# Patient Record
Sex: Female | Born: 1965 | Hispanic: No | Marital: Single | Smoking: Never smoker
Health system: Southern US, Community
[De-identification: ages and names within clinical notes are randomized; demographics above are authoritative.]

## PROBLEM LIST (undated history)

## (undated) DIAGNOSIS — E46 Unspecified protein-calorie malnutrition: Secondary | ICD-10-CM

## (undated) DIAGNOSIS — M1990 Unspecified osteoarthritis, unspecified site: Secondary | ICD-10-CM

## (undated) DIAGNOSIS — M329 Systemic lupus erythematosus, unspecified: Secondary | ICD-10-CM

## (undated) DIAGNOSIS — F32A Depression, unspecified: Secondary | ICD-10-CM

## (undated) DIAGNOSIS — D649 Anemia, unspecified: Secondary | ICD-10-CM

## (undated) HISTORY — PX: OVARIAN CYST REMOVAL: SHX89

---

## 2001-04-23 ENCOUNTER — Other Ambulatory Visit: Admission: RE | Admit: 2001-04-23 | Discharge: 2001-04-23 | Payer: Self-pay | Admitting: Obstetrics and Gynecology

## 2001-12-29 ENCOUNTER — Inpatient Hospital Stay (HOSPITAL_COMMUNITY): Admission: AD | Admit: 2001-12-29 | Discharge: 2002-01-01 | Payer: Self-pay | Admitting: Obstetrics and Gynecology

## 2002-01-02 ENCOUNTER — Encounter (HOSPITAL_COMMUNITY): Admission: RE | Admit: 2002-01-02 | Discharge: 2002-02-01 | Payer: Self-pay | Admitting: Obstetrics and Gynecology

## 2002-02-07 ENCOUNTER — Other Ambulatory Visit: Admission: RE | Admit: 2002-02-07 | Discharge: 2002-02-07 | Payer: Self-pay | Admitting: Obstetrics and Gynecology

## 2002-03-04 ENCOUNTER — Encounter: Admission: RE | Admit: 2002-03-04 | Discharge: 2002-04-03 | Payer: Self-pay | Admitting: Obstetrics and Gynecology

## 2002-05-04 ENCOUNTER — Encounter: Admission: RE | Admit: 2002-05-04 | Discharge: 2002-06-03 | Payer: Self-pay | Admitting: Obstetrics and Gynecology

## 2002-06-04 ENCOUNTER — Encounter: Admission: RE | Admit: 2002-06-04 | Discharge: 2002-07-04 | Payer: Self-pay | Admitting: Obstetrics and Gynecology

## 2002-08-03 ENCOUNTER — Encounter: Admission: RE | Admit: 2002-08-03 | Discharge: 2002-09-02 | Payer: Self-pay | Admitting: Obstetrics and Gynecology

## 2002-10-03 ENCOUNTER — Encounter: Admission: RE | Admit: 2002-10-03 | Discharge: 2002-11-02 | Payer: Self-pay | Admitting: Obstetrics and Gynecology

## 2002-12-03 ENCOUNTER — Encounter: Admission: RE | Admit: 2002-12-03 | Discharge: 2003-01-02 | Payer: Self-pay | Admitting: Obstetrics and Gynecology

## 2003-01-03 ENCOUNTER — Encounter: Admission: RE | Admit: 2003-01-03 | Discharge: 2003-02-02 | Payer: Self-pay | Admitting: Obstetrics and Gynecology

## 2003-06-26 ENCOUNTER — Encounter: Admission: RE | Admit: 2003-06-26 | Discharge: 2003-06-26 | Payer: Self-pay | Admitting: Internal Medicine

## 2006-02-12 ENCOUNTER — Emergency Department (HOSPITAL_COMMUNITY): Admission: EM | Admit: 2006-02-12 | Discharge: 2006-02-12 | Payer: Self-pay | Admitting: Emergency Medicine

## 2008-06-08 ENCOUNTER — Other Ambulatory Visit: Admission: RE | Admit: 2008-06-08 | Discharge: 2008-06-08 | Payer: Self-pay | Admitting: Family Medicine

## 2008-06-15 ENCOUNTER — Encounter: Admission: RE | Admit: 2008-06-15 | Discharge: 2008-06-15 | Payer: Self-pay | Admitting: Family Medicine

## 2010-09-06 NOTE — H&P (Signed)
Sheila Lang, Sheila Lang                         ACCOUNT NO.:  [id number]   MEDICAL RECORD NO.:  [id number]                   PATIENT TYPE:  INP   LOCATION:  9162                                 FACILITY:  [facility]   PHYSICIAN:  Genia Del, M.D.             DATE OF BIRTH:  Feb 27, 1966   DATE OF ADMISSION:  12/29/2001  DATE OF DISCHARGE:                                HISTORY & PHYSICAL   HISTORY AND PHYSICAL:  The patient is a 45 year old G1.  Expected date of  delivery January 23, 2002 at 36 weeks and 4 days gestation.   REASON FOR ADMISSION:  Regular uterine contractions every three to four  minutes increasing in intensity since 5:01 p.m. on September 10.   HISTORY OF PRESENT ILLNESS:  The patient had increasing uterine contractions  becoming regular.  No fluid leak except for one gush the night before, but  then nothing else after.  No vaginal bleeding.  Good fetal movement.  No PIH  symptoms.   PAST MEDICAL HISTORY:  Negative.   PAST SURGICAL HISTORY:  Negative.   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  Colposcopy  was done.  No treatment required on the cervix.  Paps normal since then.  History of mild pelvic endometriosis with ovarian cysts diagnosed by  laparoscopy per charts.   ALLERGIES:  No known drug allergies.   MEDICATIONS:  Prenate vitamins.   SOCIAL HISTORY:  Engaged.  Nonsmoker.   HISTORY OF PRESENT PREGNANCY:  First trimester was normal.  Laboratories  showed hemoglobin at 13+, platelets 240,000.  Blood type Rh A+.  Rh antibody  negative.  Sickle cell negative.  Thalassemia negative.  RPR nonreactive.  HBSAG negative.  HIV nonreactive.  Rubella immune.  Pap test was normal.  Gonorrhea and Chlamydia negative.  In the second trimester she declined the  triple test.  An ultrasound review of anatomy was within normal at 21+  weeks.  Placenta was anterior grade 0.  Cervix was 3.5 cm.  The patient had  a dating ultrasound at 12+ weeks.   One hour GTT in the third trimester was  increased.  The three hour GTT was normal.  A repeat ultrasound at 30 weeks  showed an estimated fetal weight of the 20th percentile and amniotic fluid  was normal.  The patient had normal blood pressures throughout pregnancy,  normal weight gain.  Group B Strep was positive.   REVIEW OF SYMPTOMS:  CONSTITUTIONAL:  Negative.  HEENT, cardiovascular,  respiratory, GI, urologic, neurologic, dermatologic, endocrinologic  negative.   PHYSICAL EXAMINATION:  GENERAL:  In pain with uterine contractions.  No  apparent distress otherwise.  VITAL SIGNS:  Stable.  Normal blood pressure.  Afebrile.  LUNGS:  Clear bilaterally.  HEART:  Regular cardiac rhythm.  ABDOMEN:  Gravid uterus.  Cephalic presentation.  PELVIC:  Vaginal examination on admission was 1+ cm, 80-90%, vertex, -2.  Membranes appeared  intact.  The second examination afterwards at 2+, 90%,  vertex, -2.  Membranes were felt better.  EXTREMITIES:  Lower limbs normal.  No edema.   Monitoring:  Fetal heart rate baseline 130 with good viability,  accelerations positive, no decelerations.  Uterine contractions every two to  four minutes lasting 50-60 seconds, mild to moderate.   IMPRESSION:  G1 36 weeks and 4 days gestation entering spontaneous labor.  Fetal well-being reassuring.  Group B Strep positive.   PLAN:  Admit to labor and delivery.  Penicillin G protocol.  Expectant  management towards probable vaginal delivery.                                               Genia Del, M.D.    ML/MEDQ  D:  12/30/2001  T:  12/30/2001  Job:  [id number]

## 2013-06-25 ENCOUNTER — Emergency Department (INDEPENDENT_AMBULATORY_CARE_PROVIDER_SITE_OTHER): Payer: Worker's Compensation

## 2013-06-25 ENCOUNTER — Encounter (HOSPITAL_COMMUNITY): Payer: Self-pay | Admitting: Emergency Medicine

## 2013-06-25 ENCOUNTER — Emergency Department (INDEPENDENT_AMBULATORY_CARE_PROVIDER_SITE_OTHER)
Admission: EM | Admit: 2013-06-25 | Discharge: 2013-06-25 | Disposition: A | Discharge status: Home / Self Care | Payer: Worker's Compensation | Source: Home / Self Care | Attending: Family Medicine | Admitting: Family Medicine

## 2013-06-25 DIAGNOSIS — M25539 Pain in unspecified wrist: Secondary | ICD-10-CM

## 2013-06-25 DIAGNOSIS — W19XXXA Unspecified fall, initial encounter: Secondary | ICD-10-CM

## 2013-06-25 DIAGNOSIS — M25569 Pain in unspecified knee: Secondary | ICD-10-CM | POA: Diagnosis not present

## 2013-06-25 MED ORDER — IBUPROFEN 800 MG PO TABS: 800.0000 mg | ORAL_TABLET | Freq: Three times a day (TID) | ORAL | Status: DC | PRN

## 2013-06-25 NOTE — ED Notes (Signed)
Fall    inj   Both  Hands   And  Knees          Pt  Was  Moving a  Pt  At  Work     At  [employer]  When  She  Lost  Her  Balance  And  Felled          She  Ambulated  To  Room  With a  Steady  Fluid gait

## 2013-06-25 NOTE — Discharge Instructions (Signed)
Wrist Pain Wrist injuries are frequent in adults and children. A sprain is an injury to the ligaments that hold your bones together. A strain is an injury to muscle or muscle cord-like structures (tendons) from stretching or pulling. Generally, when wrists are moderately tender to touch following a fall or injury, a break in the bone (fracture) may be present. Most wrist sprains or strains are better in 3 to 5 days, but complete healing may take several weeks. HOME CARE INSTRUCTIONS   Put ice on the injured area.  Put ice in a plastic bag.  Place a towel between your skin and the bag.  Leave the ice on for 15-20 minutes, 03-04 times a day, for the first 2 days.  Keep your arm raised above the level of your heart whenever possible to reduce swelling and pain.  Rest the injured area for at least 48 hours or as directed by your caregiver.  If a splint or elastic bandage has been applied, use it for as long as directed by your caregiver or until seen by a caregiver for a follow-up exam.  Only take over-the-counter or prescription medicines for pain, discomfort, or fever as directed by your caregiver.  Keep all follow-up appointments. You may need to follow up with a specialist or have follow-up X-rays. Improvement in pain level is not a guarantee that you did not fracture a bone in your wrist. The only way to determine whether or not you have a broken bone is by X-ray. SEEK IMMEDIATE MEDICAL CARE IF:   Your fingers are swollen, very red, white, or cold and blue.  Your fingers are numb or tingling.  You have increasing pain.  You have difficulty moving your fingers. MAKE SURE YOU:   Understand these instructions.  Will watch your condition.  Will get help right away if you are not doing well or get worse. Document Released: 01/15/2005 Document Revised: 06/30/2011 Document Reviewed: 05/29/2010 [facility] Patient Information 2014 [place], [place].  Knee Pain Knee pain can be a result  of an injury or other medical conditions. Treatment will depend on the cause of your pain. HOME CARE  Only take medicine as told by your doctor.  Keep a healthy weight. Being overweight can make the knee hurt more.  Stretch before exercising or playing sports.  If there is constant knee pain, change the way you exercise. Ask your doctor for advice.  Make sure shoes fit well. Choose the right shoe for the sport or activity.  Protect your knees. Wear kneepads if needed.  Rest when you are tired. GET HELP RIGHT AWAY IF:   Your knee pain does not stop.  Your knee pain does not get better.  Your knee joint feels hot to the touch.  You have a fever. MAKE SURE YOU:   Understand these instructions.  Will watch this condition.  Will get help right away if you are not doing well or get worse. Document Released: 07/04/2008 Document Revised: 06/30/2011 Document Reviewed: 07/04/2008 [facility] Patient Information 2014 [place], [place].  Musculoskeletal Pain Musculoskeletal pain is muscle and boney aches and pains. These pains can occur in any part of the body. Your caregiver may treat you without knowing the cause of the pain. They may treat you if blood or urine tests, X-rays, and other tests were normal.  CAUSES There is often not a definite cause or reason for these pains. These pains may be caused by a type of germ (virus). The discomfort may also come from overuse.  Overuse includes working out too hard when your body is not fit. Boney aches also come from weather changes. Bone is sensitive to atmospheric pressure changes. HOME CARE INSTRUCTIONS   Ask when your test results will be ready. Make sure you get your test results.  Only take over-the-counter or prescription medicines for pain, discomfort, or fever as directed by your caregiver. If you were given medications for your condition, do not drive, operate machinery or power tools, or sign legal documents for 24 hours. Do not drink  alcohol. Do not take sleeping pills or other medications that may interfere with treatment.  Continue all activities unless the activities cause more pain. When the pain lessens, slowly resume normal activities. Gradually increase the intensity and duration of the activities or exercise.  During periods of severe pain, bed rest may be helpful. Lay or sit in any position that is comfortable.  Putting ice on the injured area.  Put ice in a bag.  Place a towel between your skin and the bag.  Leave the ice on for 15 to 20 minutes, 3 to 4 times a day.  Follow up with your caregiver for continued problems and no reason can be found for the pain. If the pain becomes worse or does not go away, it may be necessary to repeat tests or do additional testing. Your caregiver may need to look further for a possible cause. SEEK IMMEDIATE MEDICAL CARE IF:  You have pain that is getting worse and is not relieved by medications.  You develop chest pain that is associated with shortness or breath, sweating, feeling sick to your stomach (nauseous), or throw up (vomit).  Your pain becomes localized to the abdomen.  You develop any new symptoms that seem different or that concern you. MAKE SURE YOU:   Understand these instructions.  Will watch your condition.  Will get help right away if you are not doing well or get worse. Document Released: 04/07/2005 Document Revised: 06/30/2011 Document Reviewed: 12/10/2012 [facility] Patient Information 2014 [place].

## 2013-06-25 NOTE — ED Provider Notes (Signed)
CSN: [id number]     Arrival date & time 06/25/13  [id number] History   First MD Initiated Contact with Patient 06/25/13 [phone]     Chief Complaint  Patient presents with  . Fall   (Consider location/radiation/quality/duration/timing/severity/associated sxs/prior Treatment) HPI Comments: [id number] presents for eval of bilateral wrist and knee pain after falling at work earlier today. 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 She has pain now in both knees and hands. She rates the pain as severe. She is having some mild tingling sensation in her right leg also. Denies any swelling. She has not taken anything for pain. No other injuries. She is ambulatory. She is requesting x-rays.  Patient is a 48 y.o. female presenting with fall.  Fall Pertinent negatives include no chest pain, no abdominal pain and no shortness of breath.    History reviewed. No pertinent past medical history. History reviewed. No pertinent past surgical history. No family history on file. History  Substance Use Topics  . Smoking status: Never Smoker   . Smokeless tobacco: Not on file  . Alcohol Use: No   OB History   Grav Para Term Preterm Abortions TAB SAB Ect Mult Living                 Review of Systems  Constitutional: Negative for fever and chills.  Eyes: Negative for visual disturbance.  Respiratory: Negative for cough and shortness of breath.   Cardiovascular: Negative for chest pain, palpitations and leg swelling.  Gastrointestinal: Negative for nausea, vomiting and abdominal pain.  Endocrine: Negative for polydipsia and polyuria.  Genitourinary: Negative for dysuria, urgency and frequency.  Musculoskeletal: Positive for arthralgias. Negative for myalgias.  Skin: Negative for rash.  Neurological: Negative for dizziness, weakness and light-headedness.    Allergies  Review of patient's allergies indicates no known allergies.  Home Medications   Current  Outpatient Rx  Name  Route  Sig  Dispense  Refill  . ibuprofen (ADVIL,MOTRIN) 800 MG tablet   Oral   Take 1 tablet (800 mg total) by mouth every 8 (eight) hours as needed.   30 tablet   0    BP 103/50  Pulse 81  Temp(Src) 98.5 F (36.9 C) (Oral)  Resp 16  SpO2 100%  LMP 05/28/2013 Physical Exam  Nursing note and vitals reviewed. Constitutional: She is oriented to person, place, and time. Vital signs are normal. She appears well-developed and well-nourished. No distress.  HENT:  Head: Normocephalic and atraumatic.  Pulmonary/Chest: Effort normal. No respiratory distress.  Musculoskeletal:  Objectively, exam of both knees, wrists, and hands are normal with no deformities, swelling, or bruising. There is no ligamentous laxity. Subjectively, she has tenderness on the dorsal and volar surface of both wrists including the anatomic snuffbox, although the pain is no worse over the anatomic snuffbox than anywhere else. The right knee, she has tenderness at the tibial plateau through the patellar tendon, and on the patella. On the left knee, she has tenderness on the patella tendon and the patella. She has full grip strength, full strength of the lower extremities bilaterally. Sensation is intact. Capillary refill is less than 2 seconds.  Neurological: She is alert and oriented to person, place, and time. She has normal strength. Coordination normal.  Skin: Skin is warm and dry. No rash noted. She is not diaphoretic.  Psychiatric: She has a normal mood and affect. Judgment normal.    ED Course  Procedures (including  critical care time) Labs Review Labs Reviewed - No data to display Imaging Review Dg Wrist Complete Left  06/25/2013   CLINICAL DATA:  Pain in bilateral wrists.  Fall.  EXAM: LEFT WRIST - COMPLETE 3+ VIEW  COMPARISON:  None.  FINDINGS: There is no evidence of fracture or dislocation. There is no evidence of arthropathy or other focal bone abnormality. Soft tissues are  unremarkable.  IMPRESSION: Negative.   Electronically Signed   By: Signa Kell M.D.   On: 06/25/2013 10:35   Dg Wrist Complete Right  06/25/2013   CLINICAL DATA:  Fall.  Pain  EXAM: RIGHT WRIST - COMPLETE 3+ VIEW  COMPARISON:  None.  FINDINGS: There is no evidence of fracture or dislocation. There is no evidence of arthropathy or other focal bone abnormality. Soft tissue swelling overlying the ulnar styloid noted.  IMPRESSION: 1. No acute bone findings. 2. Soft tissue swelling   Electronically Signed   By: Signa Kell M.D.   On: 06/25/2013 10:41   Dg Knee Complete 4 Views Left  06/25/2013   CLINICAL DATA:  Fall.  Bilateral knee pain.  EXAM: LEFT KNEE - COMPLETE 4+ VIEW  COMPARISON:  None.  FINDINGS: There is no evidence of fracture, dislocation, or joint effusion. There is no evidence of arthropathy or other focal bone abnormality. Soft tissues are unremarkable.  IMPRESSION: Negative.   Electronically Signed   By: Charlett Nose M.D.   On: 06/25/2013 10:48   Dg Knee Complete 4 Views Right  06/25/2013   CLINICAL DATA:  Fall.  EXAM: RIGHT KNEE - COMPLETE 4+ VIEW  COMPARISON:  None.  FINDINGS: There is no evidence of fracture, dislocation, or joint effusion. There is no evidence of arthropathy or other focal bone abnormality. Soft tissues are unremarkable.  IMPRESSION: Negative.   Electronically Signed   By: Charlett Nose M.D.   On: 06/25/2013 10:48     MDM   1. Fall   2. Knee pain   3. Wrist pain    All x-rays are negative. Will prescribe ibuprofen when necessary, she may return to work. Followup when necessary if not improving for remote possibility of occult fracture, will repeat x-ray   Meds ordered this encounter  Medications  . ibuprofen (ADVIL,MOTRIN) 800 MG tablet    Sig: Take 1 tablet (800 mg total) by mouth every 8 (eight) hours as needed.    Dispense:  30 tablet    Refill:  0    Order Specific Question:  Supervising Provider    Answer:  Bradd Canary D [[id number]]       Graylon Good, PA-C 06/25/13 1059

## 2013-06-25 NOTE — ED Provider Notes (Signed)
Medical screening examination/treatment/procedure(s) were performed by resident physician or non-physician practitioner and as supervising physician I was immediately available for consultation/collaboration.   Adalaide Jaskolski DOUGLAS MD.   Zeffie Bickert D Apollos Tenbrink, MD 06/25/13 1453 

## 2015-01-29 IMAGING — CR DG WRIST COMPLETE 3+V*R*
2 series · 2 of 2 positions shown · non-contrast
Comparison: None.

CLINICAL DATA: Fall.  Pain

EXAM:
RIGHT WRIST - COMPLETE 3+ VIEW

[view not recorded (1 of 2)]
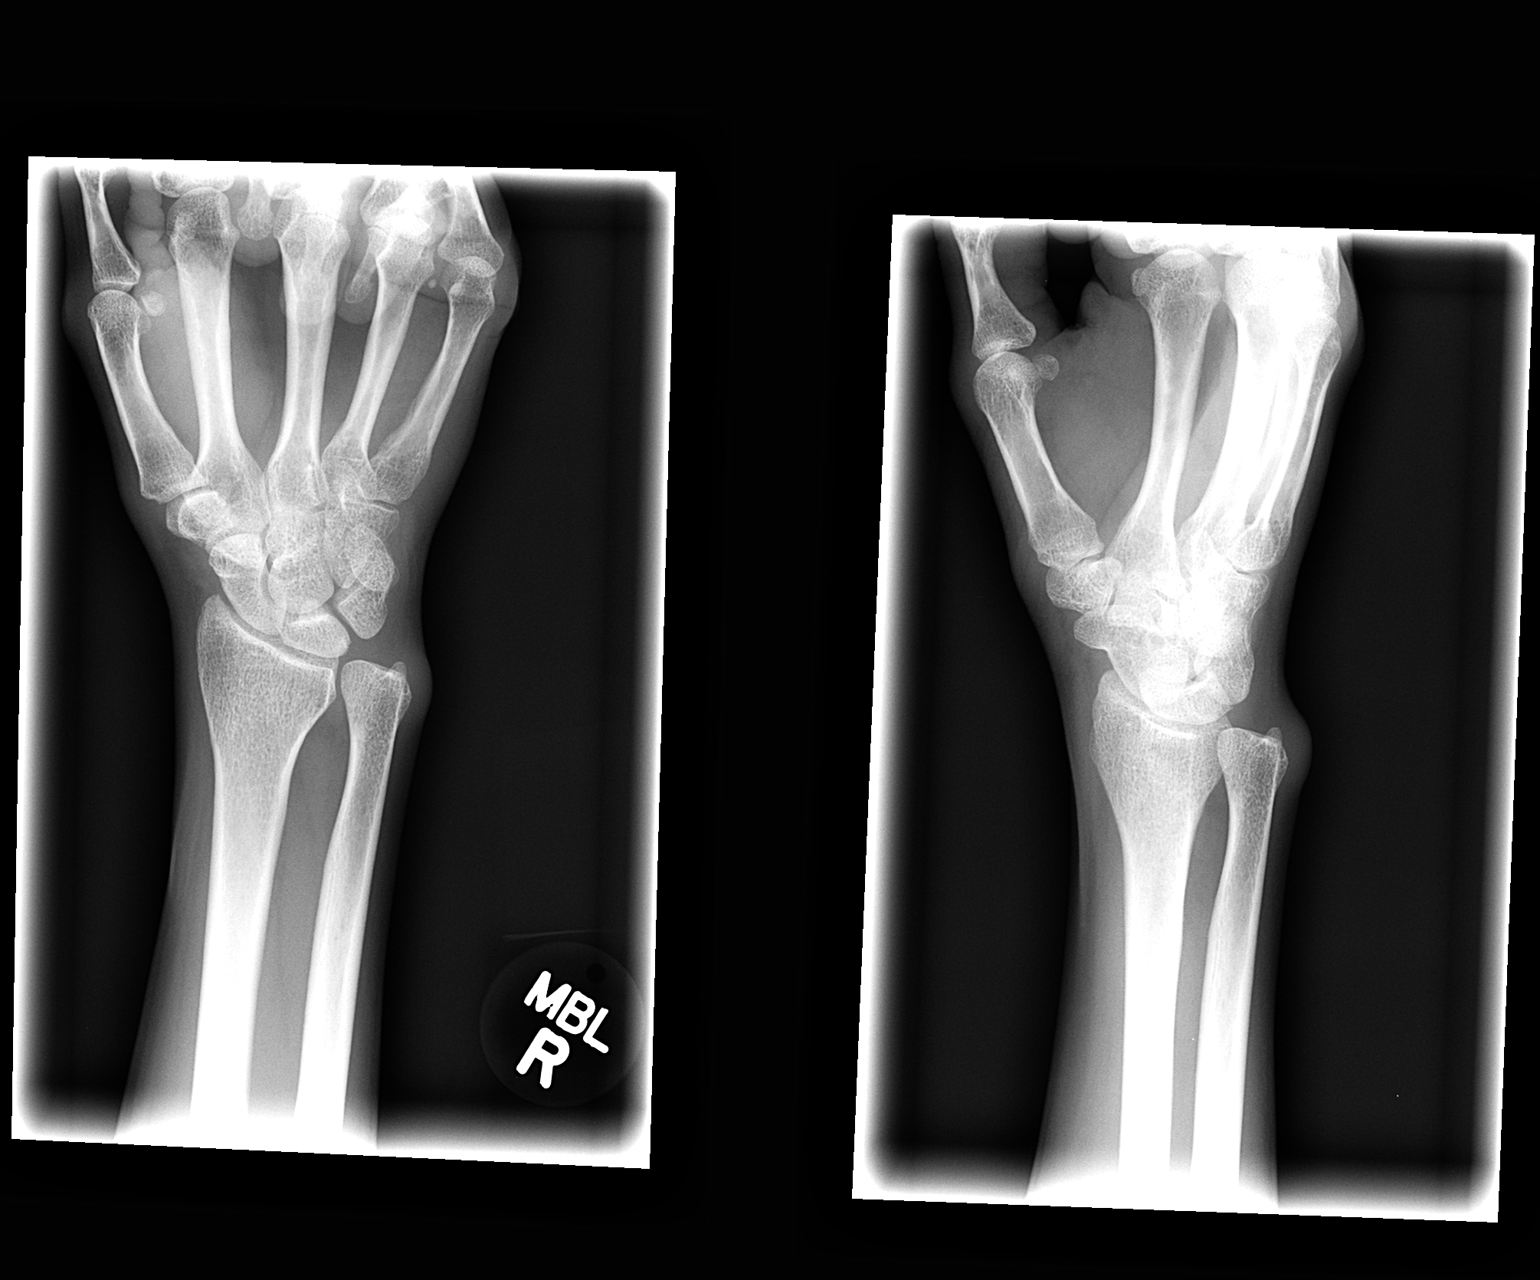

[view not recorded (2 of 2)]
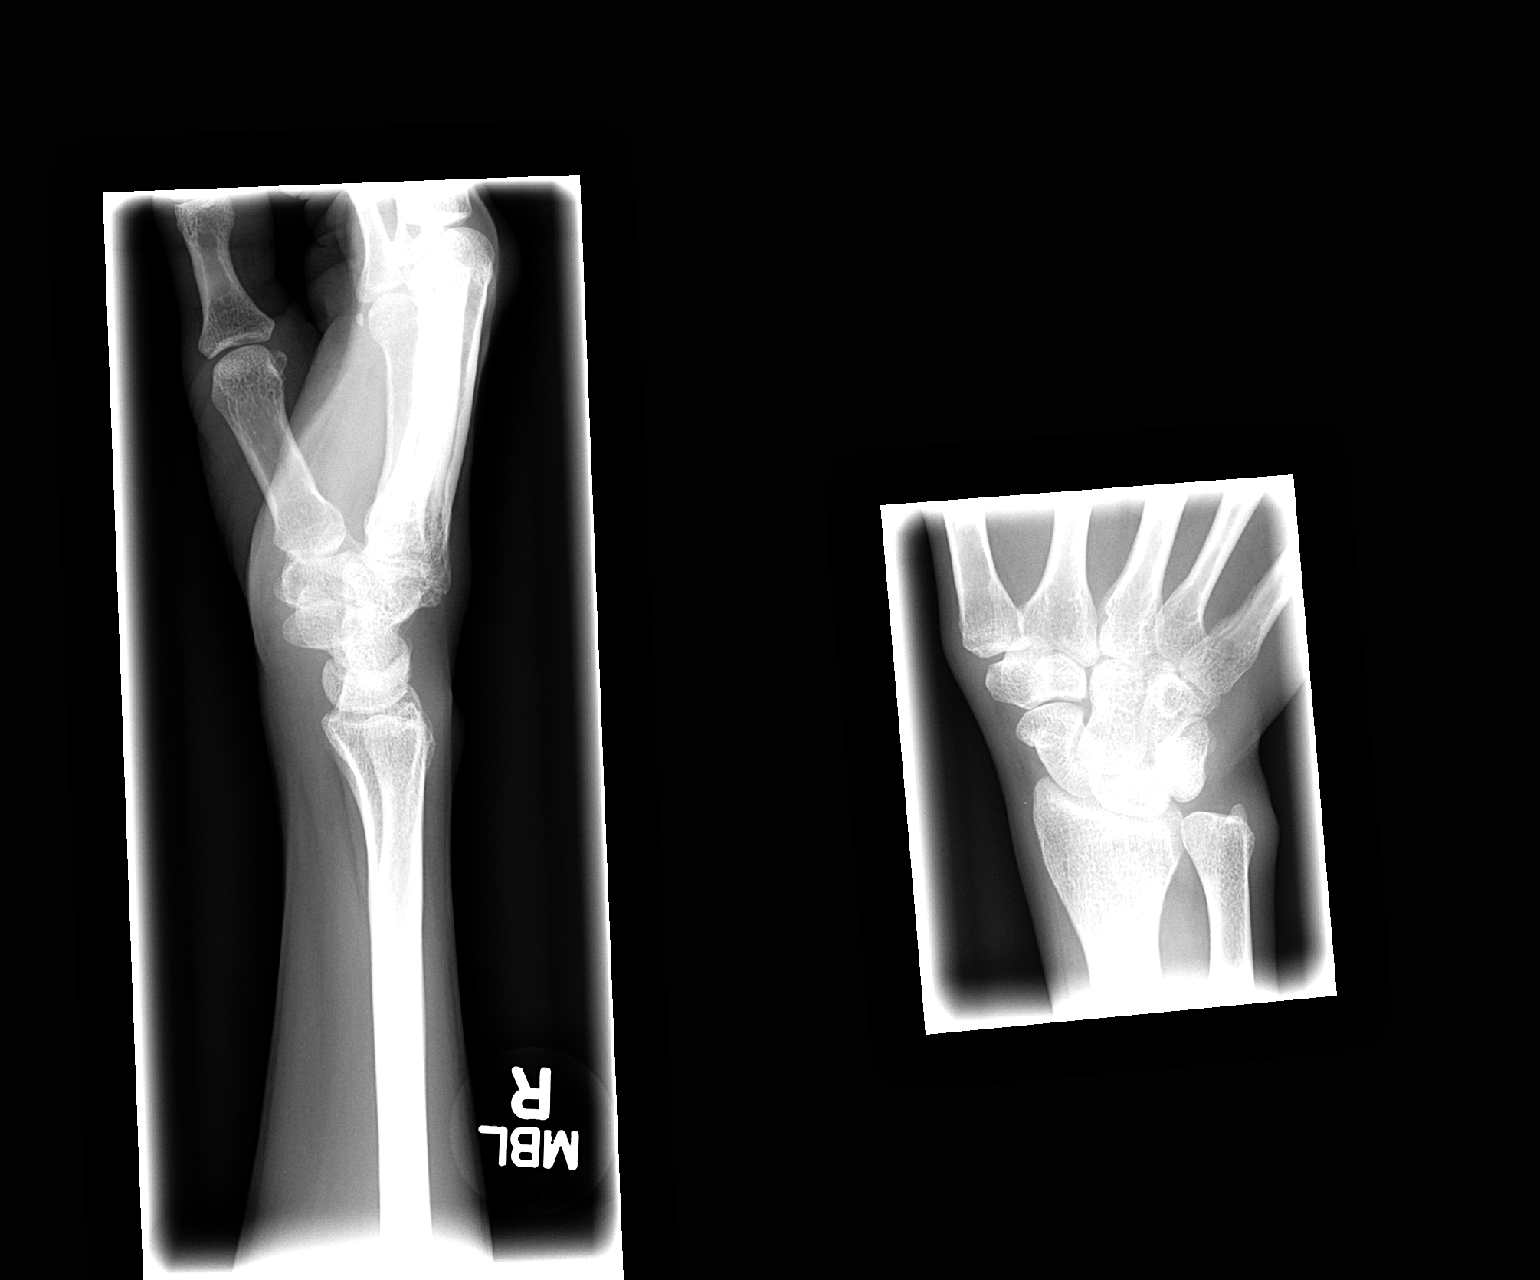

[2 of 2 positions shown; findings below may reference images not displayed]

FINDINGS: There is no evidence of fracture or dislocation. There is no
evidence of arthropathy or other focal bone abnormality. Soft tissue
swelling overlying the ulnar styloid noted.
IMPRESSION: 1. No acute bone findings.
2. Soft tissue swelling

## 2015-01-29 IMAGING — CR DG KNEE COMPLETE 4+V*L*
4 series · 4 of 4 positions shown · non-contrast
Comparison: None.

CLINICAL DATA: Fall.  Bilateral knee pain.

EXAM:
LEFT KNEE - COMPLETE 4+ VIEW

[view not recorded (1 of 4)]
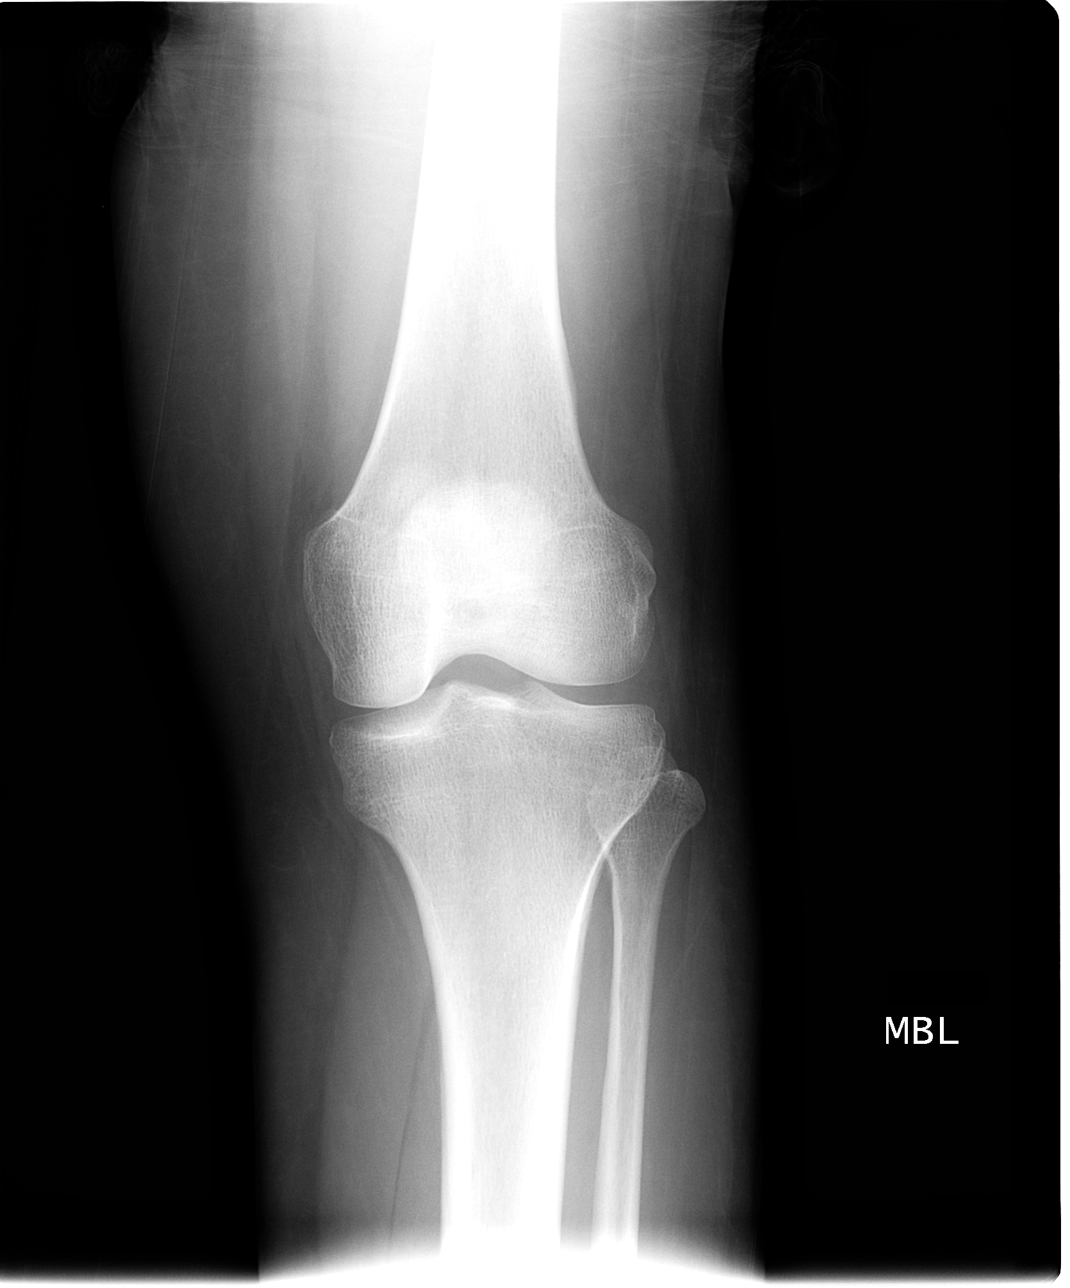

[view not recorded (2 of 4)]
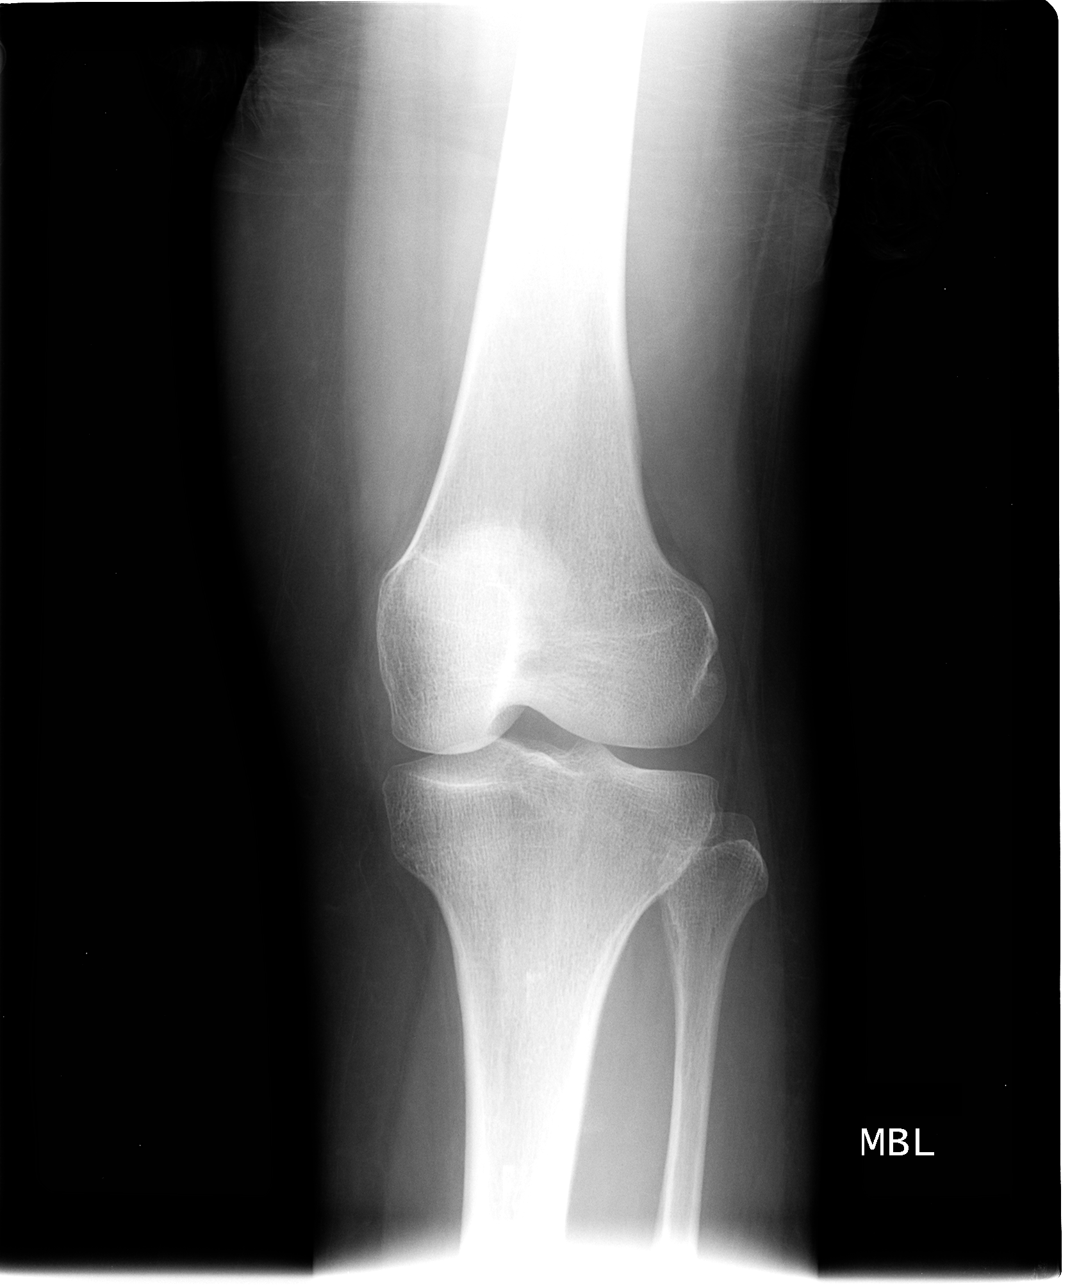

[view not recorded (3 of 4)]
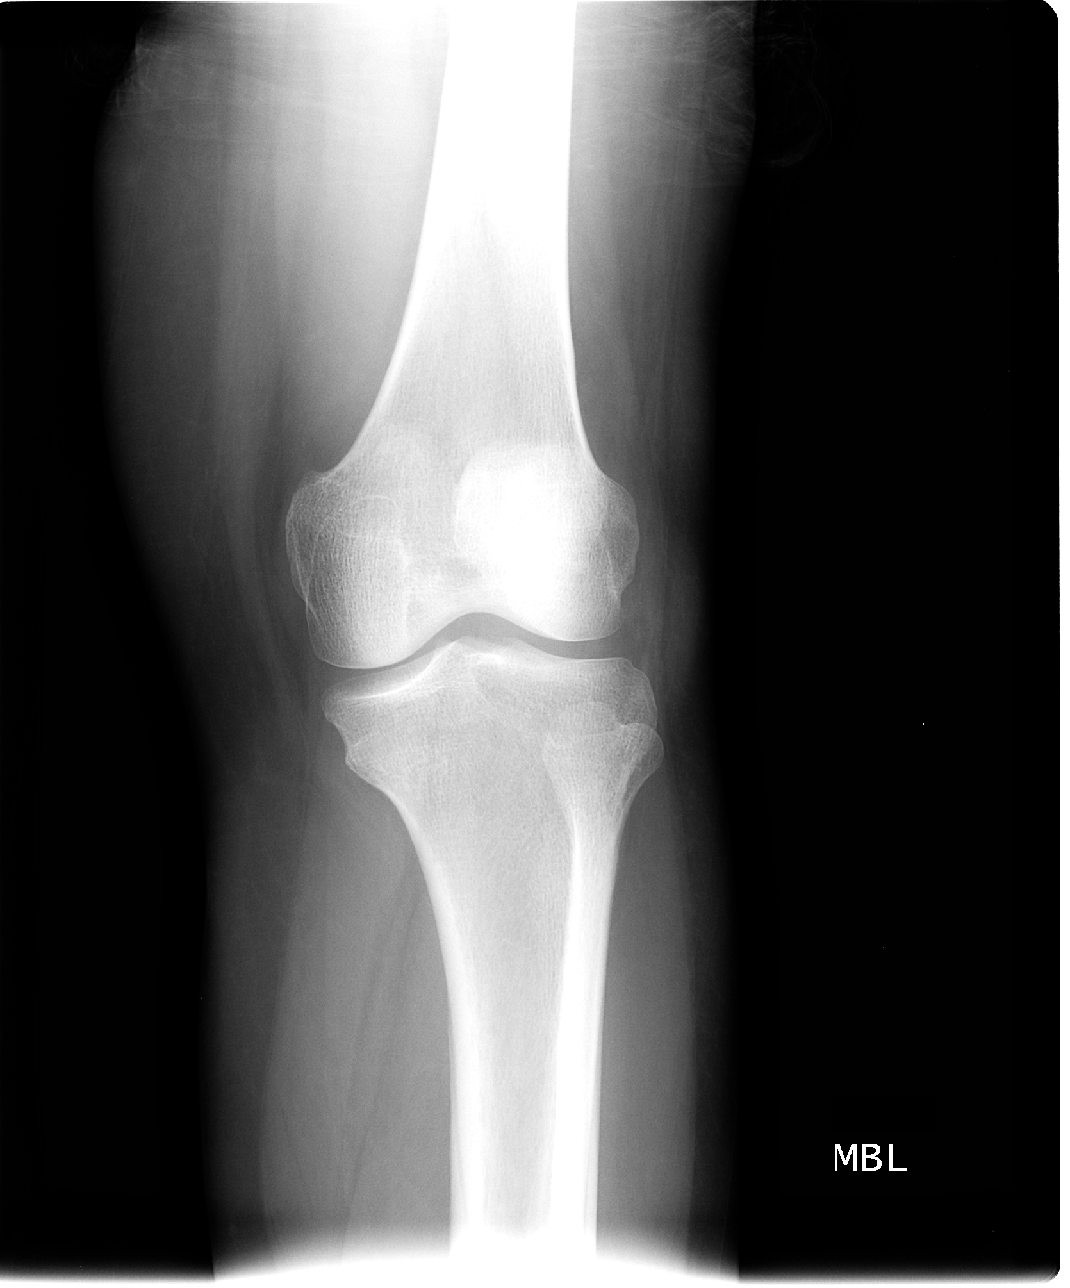

[view not recorded (4 of 4)]
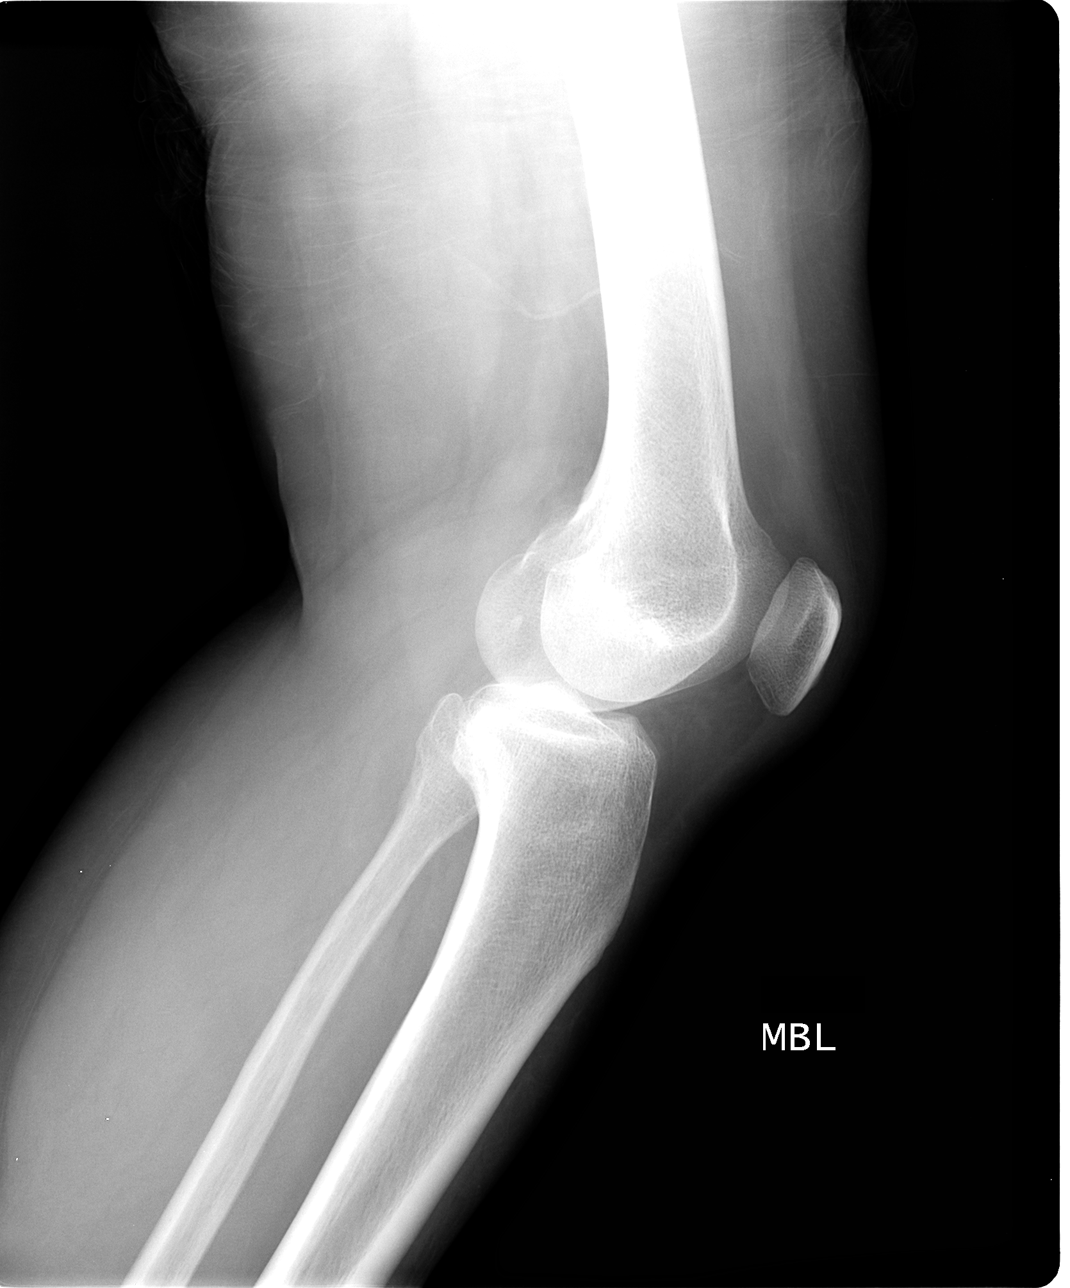

[4 of 4 positions shown; findings below may reference images not displayed]

FINDINGS: There is no evidence of fracture, dislocation, or joint effusion.
There is no evidence of arthropathy or other focal bone abnormality.
Soft tissues are unremarkable.
IMPRESSION: Negative.

## 2018-03-10 ENCOUNTER — Emergency Department
Admission: EM | Admit: 2018-03-10 | Discharge: 2018-03-10 | Disposition: A | Discharge status: Home / Self Care | Payer: Self-pay | Attending: Emergency Medicine | Admitting: Emergency Medicine

## 2018-03-10 ENCOUNTER — Emergency Department: Payer: Self-pay

## 2018-03-10 ENCOUNTER — Other Ambulatory Visit: Payer: Self-pay

## 2018-03-10 ENCOUNTER — Encounter: Payer: Self-pay | Admitting: Emergency Medicine

## 2018-03-10 DIAGNOSIS — Z111 Encounter for screening for respiratory tuberculosis: Secondary | ICD-10-CM | POA: Insufficient documentation

## 2018-03-10 NOTE — ED Triage Notes (Signed)
Pt is here for CT scan of chest and blood work for TB. Pt states she needs these before starting job. States that she has a job at [employer] and had chest xray. States that employer told her to go to other hospitals to see if they can help her d/t not having insurance to schedule CT and blood work outpatient through their employee clinic. Spoke to Dr. Sharma CovertNorman and orders received.

## 2018-03-10 NOTE — ED Provider Notes (Signed)
[facility] Emergency Department Provider Note  ____________________________________________  Time seen: Approximately 5:50 PM  I have reviewed the triage vital signs and the nursing notes.   HISTORY  Chief Complaint No chief complaint on file.   HPI Sheila Lang is a 52 y.o. female presents requesting TB screening.  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  As part of her medical clearance she had a chest x-ray done which was abnormal.  Because she is from [place] she was told that she needed a CT scan and a QuantiFERON gold blood test to rule out TB.  Patient denies night sweats, unintentional weight loss, hemoptysis, chest pain or shortness of breath.  She denies any known prior exposures to tuberculosis.  She was told at [facility] to come to her local hospital to see if she could have these tests done.  She presented today requesting that this test be done.   Past Surgical History:  Procedure Laterality Date  . OVARIAN CYST REMOVAL      Prior to Admission medications   Medication Sig Start Date End Date Taking? Authorizing Provider  ibuprofen (ADVIL,MOTRIN) 800 MG tablet Take 1 tablet (800 mg total) by mouth every 8 (eight) hours as needed. 06/25/13   Graylon Good, PA-C    Allergies Patient has no known allergies.  History reviewed. No pertinent family history.  Social History Social History   Tobacco Use  . Smoking status: Never Smoker  Substance Use Topics  . Alcohol use: No  . Drug use: Not on file    Review of Systems  Constitutional: Negative for fever. Eyes: Negative for visual changes. ENT: Negative for sore throat. Neck: No neck pain  Cardiovascular: Negative for chest pain. Respiratory: Negative for shortness of breath. Gastrointestinal: Negative for abdominal pain, vomiting or diarrhea. Genitourinary: Negative for dysuria. Musculoskeletal: Negative for back pain. Skin: Negative for  rash. Neurological: Negative for headaches, weakness or numbness. Psych: No SI or HI  ____________________________________________   PHYSICAL EXAM:  VITAL SIGNS: ED Triage Vitals  Enc Vitals Group     BP 03/10/18 1355 127/60     Pulse Rate 03/10/18 1355 97     Resp 03/10/18 1355 16     Temp 03/10/18 1355 97.9 F (36.6 C)     Temp Source 03/10/18 1355 Oral     SpO2 03/10/18 1355 100 %     Weight 03/10/18 1356 110 lb (49.9 kg)     Height 03/10/18 1356 5' (1.524 m)     Head Circumference --      Peak Flow --      Pain Score 03/10/18 1403 0     Pain Loc --      Pain Edu? --      Excl. in GC? --     Constitutional: Alert and oriented. Well appearing and in no apparent distress. HEENT:      Head: Normocephalic and atraumatic.         Eyes: Conjunctivae are normal. Sclera is non-icteric.       Mouth/Throat: Mucous membranes are moist.       Neck: Supple with no signs of meningismus. Cardiovascular: Regular rate and rhythm. No murmurs, gallops, or rubs. 2+ symmetrical distal pulses are present in all extremities. No JVD. Respiratory: Normal respiratory effort. Lungs are clear to auscultation bilaterally. No wheezes, crackles, or rhonchi.  Gastrointestinal: Soft, non tender, and non distended with positive bowel sounds. No rebound or guarding. Musculoskeletal: Nontender  with normal range of motion in all extremities. No edema, cyanosis, or erythema of extremities. Neurologic: Normal speech and language. Face is symmetric. Moving all extremities. No gross focal neurologic deficits are appreciated. Skin: Skin is warm, dry and intact. No rash noted. Psychiatric: Mood and affect are normal. Speech and behavior are normal.  ____________________________________________   LABS (all labs ordered are listed, but only abnormal results are displayed)  Labs Reviewed  QUANTIFERON-TB GOLD PLUS   ____________________________________________  EKG  none   ____________________________________________  RADIOLOGY  I have personally reviewed the images performed during this visit and I agree with the Radiologist's read.   Interpretation by Radiologist:  Ct Chest Wo Contrast  Result Date: 03/10/2018 CLINICAL DATA:  Follow up abnormal radiograph at outside institution. Assess for tuberculosis. EXAM: CT CHEST WITHOUT CONTRAST TECHNIQUE: Multidetector CT imaging of the chest was performed following the standard protocol without IV contrast. COMPARISON:  Chest radiograph March 10, 2013 FINDINGS: CARDIOVASCULAR: Heart and pericardium are unremarkable. Thoracic aorta is normal course and caliber, unremarkable. MEDIASTINUM/NODES: No mediastinal mass. No lymphadenopathy by CT size criteria. Calcified RIGHT paratracheal and RIGHT hilar lymph nodes. LUNGS/PLEURA: Tracheobronchial tree is patent, no pneumothorax. Dependent atelectasis. RIGHT upper lobe calcified granuloma. RIGHT apical pleuroparenchymal scarring with traction bronchiectasis. Focal LEFT upper lobe subpleural scarring. UPPER ABDOMEN: Nonacute. MUSCULOSKELETAL: Nonacute. IMPRESSION: 1. No acute cardiopulmonary process. 2. Old granulomatous disease. Electronically Signed   By: Awilda Metroourtnay  Bloomer M.D.   On: 03/10/2018 14:50     ____________________________________________   PROCEDURES  Procedure(s) performed: None Procedures Critical Care performed:  None ____________________________________________   INITIAL IMPRESSION / ASSESSMENT AND PLAN / ED COURSE   52 y.o. female presents requesting TB screening for a job.  Patient denies any symptoms consistent with tuberculosis.  Prior to my evaluation of a CT scan and QuantiFERON gold had already been ordered from the waiting room.  The CT shows old granulomatous changes but no evidence of any active disease.  QuantiFERON gold is pending.  I provided the patient with a CT report and recommended that she calls patient medical records in a few days  for results of the blood work.      As part of my medical decision making, I reviewed the following data within the electronic MEDICAL RECORD NUMBER Nursing notes reviewed and incorporated, Old chart reviewed, Radiograph reviewed , Notes from prior ED visits and [place] Controlled Substance Database    Pertinent labs & imaging results that were available during my care of the patient were reviewed by me and considered in my medical decision making (see chart for details).    ____________________________________________   FINAL CLINICAL IMPRESSION(S) / ED DIAGNOSES  Final diagnoses:  Tuberculosis screening      NEW MEDICATIONS STARTED DURING THIS VISIT:  ED Discharge Orders    None       Note:  This document was prepared using Dragon voice recognition software and may include unintentional dictation errors.    Nita SickleVeronese, [place], MD 03/10/18 [phone]

## 2018-03-10 NOTE — ED Notes (Signed)
AAOx3.  Skin warm and dry. nAD 

## 2018-03-14 LAB — QUANTIFERON-TB GOLD PLUS (RQFGPL)
QUANTIFERON MITOGEN VALUE: 4.31 [IU]/mL
QUANTIFERON NIL VALUE: 0.04 [IU]/mL
QUANTIFERON TB1 AG VALUE: 0.04 [IU]/mL
QUANTIFERON TB2 AG VALUE: 0.1 [IU]/mL

## 2018-03-14 LAB — QUANTIFERON-TB GOLD PLUS: QuantiFERON-TB Gold Plus: NEGATIVE

## 2019-05-10 ENCOUNTER — Ambulatory Visit (INDEPENDENT_AMBULATORY_CARE_PROVIDER_SITE_OTHER): Payer: 59 | Admitting: Adult Health

## 2019-05-10 DIAGNOSIS — Z5329 Procedure and treatment not carried out because of patient's decision for other reasons: Secondary | ICD-10-CM

## 2019-05-10 NOTE — Progress Notes (Signed)
NO show for appointment

## 2019-05-11 ENCOUNTER — Telehealth: Payer: Self-pay | Admitting: Physician Assistant

## 2019-05-11 NOTE — Telephone Encounter (Signed)
Please reschedule this patient. I am not accepting patient's at this point unless specifically OK it. Thanks.

## 2019-05-12 ENCOUNTER — Ambulatory Visit: Payer: 59 | Admitting: Physician Assistant

## 2019-05-23 ENCOUNTER — Encounter: Payer: Self-pay | Admitting: Adult Health

## 2019-05-23 ENCOUNTER — Ambulatory Visit (INDEPENDENT_AMBULATORY_CARE_PROVIDER_SITE_OTHER): Payer: 59 | Admitting: Adult Health

## 2019-05-23 ENCOUNTER — Ambulatory Visit
Admission: RE | Admit: 2019-05-23 | Discharge: 2019-05-23 | Disposition: A | Discharge status: Home / Self Care | Payer: 59 | Source: Ambulatory Visit | Attending: Adult Health | Admitting: Adult Health

## 2019-05-23 ENCOUNTER — Other Ambulatory Visit: Payer: Self-pay

## 2019-05-23 VITALS — BP 102/64 | HR 88 | Temp 97.2°F | Ht 60.0 in | Wt 116.2 lb

## 2019-05-23 DIAGNOSIS — M79641 Pain in right hand: Secondary | ICD-10-CM

## 2019-05-23 DIAGNOSIS — Z8639 Personal history of other endocrine, nutritional and metabolic disease: Secondary | ICD-10-CM | POA: Diagnosis not present

## 2019-05-23 DIAGNOSIS — M79642 Pain in left hand: Secondary | ICD-10-CM | POA: Insufficient documentation

## 2019-05-23 DIAGNOSIS — M898X9 Other specified disorders of bone, unspecified site: Secondary | ICD-10-CM | POA: Diagnosis not present

## 2019-05-23 DIAGNOSIS — Z114 Encounter for screening for human immunodeficiency virus [HIV]: Secondary | ICD-10-CM

## 2019-05-23 DIAGNOSIS — M79606 Pain in leg, unspecified: Secondary | ICD-10-CM

## 2019-05-23 DIAGNOSIS — Z0000 Encounter for general adult medical examination without abnormal findings: Secondary | ICD-10-CM

## 2019-05-23 DIAGNOSIS — M79603 Pain in arm, unspecified: Secondary | ICD-10-CM | POA: Diagnosis not present

## 2019-05-23 DIAGNOSIS — Z1159 Encounter for screening for other viral diseases: Secondary | ICD-10-CM | POA: Diagnosis not present

## 2019-05-23 DIAGNOSIS — Z87898 Personal history of other specified conditions: Secondary | ICD-10-CM

## 2019-05-23 DIAGNOSIS — Z1231 Encounter for screening mammogram for malignant neoplasm of breast: Secondary | ICD-10-CM

## 2019-05-23 DIAGNOSIS — Z13220 Encounter for screening for lipoid disorders: Secondary | ICD-10-CM

## 2019-05-23 NOTE — Patient Instructions (Addendum)
[place] at [facility] 2.8 5 Google reviews Mammography service in [place], [place] COVID-19 info: [email or web address] Get online care: [email or web address] Located in: [facility] Address: [facility], [place], [place], [place] [address] Hours:  Open ? Closes 5PM Phone: [phone]   [facility] today for hand x rays  - walk in- they are across the street from our office.   Will refer to Dr. Leotis Pain for vascular examination.   Health Maintenance, Female Adopting a healthy lifestyle and getting preventive care are important in promoting health and wellness. Ask your health care provider about:  The right schedule for you to have regular tests and exams.  Things you can do on your own to prevent diseases and keep yourself healthy. What should I know about diet, weight, and exercise? Eat a healthy diet   Eat a diet that includes plenty of vegetables, fruits, low-fat dairy products, and lean protein.  Do not eat a lot of foods that are high in solid fats, added sugars, or sodium. Maintain a healthy weight Body mass index (BMI) is used to identify weight problems. It estimates body fat based on height and weight. Your health care provider can help determine your BMI and help you achieve or maintain a healthy weight. Get regular exercise Get regular exercise. This is one of the most important things you can do for your health. Most adults should:  Exercise for at least 150 minutes each week. The exercise should increase your heart rate and make you sweat (moderate-intensity exercise).  Do strengthening exercises at least twice a week. This is in addition to the moderate-intensity exercise.  Spend less time sitting. Even light physical activity can be beneficial. Watch cholesterol and blood lipids Have your blood tested for lipids and cholesterol at 54 years of age,  then have this test every 5 years. Have your cholesterol levels checked more often if:  Your lipid or cholesterol levels are high.  You are older than 54 years of age.  You are at high risk for heart disease. What should I know about cancer screening? Depending on your health history and family history, you may need to have cancer screening at various ages. This may include screening for:  Breast cancer.  Cervical cancer.  Colorectal cancer.  Skin cancer.  Lung cancer. What should I know about heart disease, diabetes, and high blood pressure? Blood pressure and heart disease  High blood pressure causes heart disease and increases the risk of stroke. This is more likely to develop in people who have high blood pressure readings, are of African descent, or are overweight.  Have your blood pressure checked: ? Every 3-5 years if you are 62-57 years of age. ? Every year if you are 74 years old or older. Diabetes Have regular diabetes screenings. This checks your fasting blood sugar level. Have the screening done:  Once every three years after age 22 if you are at a normal weight and have a low risk for diabetes.  More often and at a younger age if you are overweight or have a high risk for diabetes. What should I know about preventing infection? Hepatitis B If you have a higher risk for hepatitis B, you should be screened for this virus. Talk with your health care provider to find out if you are at risk for hepatitis B infection. Hepatitis C Testing is recommended for:  Everyone born from [birth year]  through 1965.  Anyone with known risk factors for hepatitis C. Sexually transmitted infections (STIs)  Get screened for STIs, including gonorrhea and chlamydia, if: ? You are sexually active and are younger than 54 years of age. ? You are older than 54 years of age and your health care provider tells you that you are at risk for this type of infection. ? Your sexual activity has  changed since you were last screened, and you are at increased risk for chlamydia or gonorrhea. Ask your health care provider if you are at risk.  Ask your health care provider about whether you are at high risk for HIV. Your health care provider may recommend a prescription medicine to help prevent HIV infection. If you choose to take medicine to prevent HIV, you should first get tested for HIV. You should then be tested every 3 months for as long as you are taking the medicine. Pregnancy  If you are about to stop having your period (premenopausal) and you may become pregnant, seek counseling before you get pregnant.  Take 400 to 800 micrograms (mcg) of folic acid every day if you become pregnant.  Ask for birth control (contraception) if you want to prevent pregnancy. Osteoporosis and menopause Osteoporosis is a disease in which the bones lose minerals and strength with aging. This can result in bone fractures. If you are 59 years old or older, or if you are at risk for osteoporosis and fractures, ask your health care provider if you should:  Be screened for bone loss.  Take a calcium or vitamin D supplement to lower your risk of fractures.  Be given hormone replacement therapy (HRT) to treat symptoms of menopause. Follow these instructions at home: Lifestyle  Do not use any products that contain nicotine or tobacco, such as cigarettes, e-cigarettes, and chewing tobacco. If you need help quitting, ask your health care provider.  Do not use street drugs.  Do not share needles.  Ask your health care provider for help if you need support or information about quitting drugs. Alcohol use  Do not drink alcohol if: ? Your health care provider tells you not to drink. ? You are pregnant, may be pregnant, or are planning to become pregnant.  If you drink alcohol: ? Limit how much you use to 0-1 drink a day. ? Limit intake if you are breastfeeding.  Be aware of how much alcohol is in  your drink. In the [place] one drink equals one 12 oz bottle of beer (355 mL), one 5 oz glass of wine (148 mL), or one 1 oz glass of hard liquor (44 mL). General instructions  Schedule regular health, dental, and eye exams.  Stay current with your vaccines.  Tell your health care provider if: ? You often feel depressed. ? You have ever been abused or do not feel safe at home. Summary  Adopting a healthy lifestyle and getting preventive care are important in promoting health and wellness.  Follow your health care provider's instructions about healthy diet, exercising, and getting tested or screened for diseases.  Follow your health care provider's instructions on monitoring your cholesterol and blood pressure. This information is not intended to replace advice given to you by your health care provider. Make sure you discuss any questions you have with your health care provider. Document Revised: 03/31/2018 Document Reviewed: 03/31/2018 Elsevier Patient Education  2020 [employer].

## 2019-05-23 NOTE — Progress Notes (Signed)
Patient: Sheila Lang, Female    DOB: Mar 07, 1966, 54 y.o.   MRN: [id number] Visit Date: 05/23/2019  Today's Provider: Jairo Ben, FNP   Chief Complaint  Patient presents with  . Annual Exam   Subjective:    New Patient Appointment  Sheila Lang is a 54 y.o. female who presents today for new patient appointment. She feels fairly well. She reports exercising includes walking. She reports she is sleeping well.  She is a Psychologist, sport and exercise at [employer].  Patient is oriented to person, place and situation at today's visit.   She reports she has had pain in her bilateral forearms, she reports she thinks " some entity gave her this pain and a man was wearing at tourniquet at church and that told her why she had pain" She reports at times she has visions she sees " snakes wrapped around her neck in the past ". She also reports seeing " yellow in her face and in other peoples faces and that this means they have liver issues ". She reports she can look at the pastor at church and has a vision that his face is crooked and she assumes this is a stroke to come. She reports she is Catholic, however she was raised Hindu and has " always had these visions".   She has joint pain she reports.She reports she feels " electric pain in her veins of her arms" from elbow to her hands. She is concerned of the way her veins in her arms and legs look. She is on concrete. Legs are painful- for at least 3 months. She reports " broken veins " in lower legs and feeling like someone " punched me " in the legs when I get home in the evening. 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  She drinks a lot of water, boost, ensure. She feels as iif she may have " worms in her veins".  She denies any history of malignancy.  She reports a lot of bone pain.  She denies any back pain or neck pain. She denies any injury or trauma.   She has a 24 year old daughter, has not seen PCP since then she reports  that was her last PAP smear as well. Mammogram is over due last 06/19/2008.   She has been taking Tylenol, aleve, and vitamins to help.   She denies any injury.    No menses since age 67. Last pap smear 2010 [place], normal. Denies any abnormal. Denies any abnormal vaginal bleeding  History of ovarian cyst in past years ago.   She has been here since she was 71 she came from [place].  She requests hepatitis testing and HIV testing, she also would like RPR testing. She denies any other concerns for STD's. She reports her father contracted a hepatitis in later life she is unsure what type and she wants to be screened. She denies any known exposures.   Wants to see Dr. Renard Matter for  Rheumatology later possibly - she brings a card with her today.   Patient  denies any fever,chills, rash, chest pain, shortness of breath, nausea, vomiting, or diarrhea.   ----------------------------------------------------------------- Last mammogram:06/19/2008  Review of Systems  Constitutional: Negative.   HENT: Negative.   Eyes: Negative.   Respiratory: Negative.   Cardiovascular: Positive for leg swelling (she reports intermittently bilateral legs ). Negative for chest pain and palpitations.  Gastrointestinal: Negative.   Endocrine: Negative.  Genitourinary: Negative.   Musculoskeletal: Positive for arthralgias (lower leg pain and forearm pain/wrist pain bilateral ), myalgias and neck pain. Negative for back pain, gait problem, joint swelling and neck stiffness.  Skin: Negative.   Allergic/Immunologic: Negative.   Neurological: Negative.   Hematological: Negative.   Psychiatric/Behavioral: Negative for agitation, behavioral problems, confusion, decreased concentration, dysphoric mood, self-injury, sleep disturbance and suicidal ideas. The patient is nervous/anxious. The patient is not hyperactive.     Social History She  reports that she has never smoked. She has never used smokeless tobacco. She  reports that she does not drink alcohol or use drugs. Social History   Socioeconomic History  . Marital status: Single    Spouse name: Not on file  . Number of children: Not on file  . Years of education: Not on file  . Highest education level: Not on file  Occupational History  . Not on file  Tobacco Use  . Smoking status: Never Smoker  . Smokeless tobacco: Never Used  Substance and Sexual Activity  . Alcohol use: No  . Drug use: Never  . Sexual activity: Not on file  Other Topics Concern  . Not on file  Social History Narrative  . Not on file   Social Determinants of Health   Financial Resource Strain:   . Difficulty of Paying Living Expenses: Not on file  Food Insecurity:   . Worried About Charity fundraiser in the Last Year: Not on file  . Ran Out of Food in the Last Year: Not on file  Transportation Needs:   . Lack of Transportation (Medical): Not on file  . Lack of Transportation (Non-Medical): Not on file  Physical Activity:   . Days of Exercise per Week: Not on file  . Minutes of Exercise per Session: Not on file  Stress:   . Feeling of Stress : Not on file  Social Connections:   . Frequency of Communication with Friends and Family: Not on file  . Frequency of Social Gatherings with Friends and Family: Not on file  . Attends Religious Services: Not on file  . Active Member of Clubs or Organizations: Not on file  . Attends Archivist Meetings: Not on file  . Marital Status: Not on file    There are no problems to display for this patient.   Past Surgical History:  Procedure Laterality Date  . OVARIAN CYST REMOVAL      Family History  No family status information on file.   Her family history is not on file.     No Known Allergies  Previous Medications   ASCORBIC ACID (VITAMIN C) 1000 MG TABLET    Take 1,000 mg by mouth 2 (two) times daily.   CALCIUM-VITAMIN D (OSCAL WITH D) 250-125 MG-UNIT TABLET    Take 1 tablet by mouth daily.    IBUPROFEN (ADVIL,MOTRIN) 800 MG TABLET    Take 1 tablet (800 mg total) by mouth every 8 (eight) hours as needed.   MULTIPLE VITAMIN (MULTI-VITAMIN DAILY) TABS    Take by mouth.   OMEGA-3 FATTY ACIDS (FISH OIL) 1000 MG CAPS    Take by mouth.    Patient Care Team: Doreen Beam, FNP as PCP - General (Family Medicine)      Objective:   Vitals: BP 102/64 (BP Location: Left Arm, Patient Position: Sitting, Cuff Size: Normal)   Pulse 88   Temp (!) 97.2 F (36.2 C) (Temporal)   Ht 5' (1.524 m)  Wt 116 lb 3.2 oz (52.7 kg)   SpO2 98%   BMI 22.69 kg/m    Physical Exam Constitutional:      General: She is not in acute distress.    Appearance: She is not ill-appearing, toxic-appearing or diaphoretic.     Comments: thin  HENT:     Head: Normocephalic and atraumatic.     Right Ear: Tympanic membrane, ear canal and external ear normal. There is no impacted cerumen.     Left Ear: Tympanic membrane, ear canal and external ear normal. There is no impacted cerumen.     Nose: Nose normal. No congestion or rhinorrhea.     Mouth/Throat:     Mouth: Mucous membranes are moist.     Pharynx: No oropharyngeal exudate or posterior oropharyngeal erythema.  Eyes:     General: No scleral icterus.       Right eye: No discharge.        Left eye: No discharge.     Extraocular Movements: Extraocular movements intact.     Conjunctiva/sclera: Conjunctivae normal.     Pupils: Pupils are equal, round, and reactive to light.  Neck:     Vascular: No carotid bruit.  Cardiovascular:     Rate and Rhythm: Normal rate and regular rhythm.     Chest Wall: PMI is not displaced.     Pulses:          Carotid pulses are 2+ on the right side and 2+ on the left side.      Radial pulses are 2+ on the right side and 2+ on the left side.       Dorsalis pedis pulses are 1+ on the right side.       Posterior tibial pulses are 1+ on the right side and 1+ on the left side.     Heart sounds: Normal heart sounds. No  murmur. No friction rub. No gallop.      Comments: Multiple varicosities present on arms and lower legs bilateral.   Bilateral radial pulse 2+ Dorsalis pedis bilateral 1+  No bruising.  Negative homans sign.  Pulmonary:     Effort: Pulmonary effort is normal. No respiratory distress.     Breath sounds: Normal breath sounds. No stridor. No wheezing, rhonchi or rales.  Chest:     Chest wall: No tenderness.  Abdominal:     General: Abdomen is flat. Bowel sounds are normal. There is no distension.     Palpations: Abdomen is soft. There is no mass.     Tenderness: There is no abdominal tenderness. There is no right CVA tenderness, left CVA tenderness, guarding or rebound.     Hernia: No hernia is present.  Genitourinary:    Comments: deferred to next visit advised within 1 month needs breast exam/ PAP.  Mammogram screening ordered.  Musculoskeletal:        General: No swelling, tenderness, deformity or signs of injury. Normal range of motion.     Cervical back: Normal range of motion and neck supple. No rigidity or tenderness.     Right lower leg: Edema (trace to 1 + Bilateral ) present.     Left lower leg: Edema present.  Lymphadenopathy:     Cervical: No cervical adenopathy.  Skin:    General: Skin is warm and dry.     Capillary Refill: Capillary refill takes less than 2 seconds.     Coloration: Skin is not jaundiced or pale.     Findings: No bruising,  erythema, lesion or rash.  Neurological:     General: No focal deficit present.     Mental Status: She is alert and oriented to person, place, and time.     Cranial Nerves: No cranial nerve deficit.     Sensory: No sensory deficit.     Motor: No weakness.     Coordination: Coordination normal.     Gait: Gait normal.     Deep Tendon Reflexes: Reflexes normal.  Psychiatric:        Mood and Affect: Mood normal.        Judgment: Judgment normal.     Comments: She denies any homicidal or suicidal ideations or intents. She has no "  visions " at today's visit.  Today she is able to answer all questions appropriately.       Depression Screen PHQ 2/9 Scores 05/23/2019  PHQ - 2 Score 0  PHQ- 9 Score 0      Assessment & Plan:     Routine Health Maintenance and Physical Exam  Exercise Activities and Dietary recommendations Goals   None      There is no immunization history on file for this patient.  Health Maintenance  Topic Date Due  . HIV Screening  05/10/1980  . TETANUS/TDAP  05/10/1984  . PAP SMEAR-Modifier  05/10/1986  . MAMMOGRAM  05/11/2015  . COLONOSCOPY  05/11/2015  . INFLUENZA VACCINE  Completed     Discussed health benefits of physical activity, and encouraged her to engage in regular exercise appropriate for her age and condition.    -------------------------------------------------------------------- Routine health maintenance  Upper and lower extremity pain - Plan: Ambulatory referral to Vascular Surgery  Need for hepatitis C screening test - Plan: Hepatitis c antibody (reflex)  History of vitamin D deficiency - Plan: VITAMIN D 25 Hydroxy (Vit-D Deficiency, Fractures)  Bone pain - Plan: Rheumatoid factor, ANA, RPR w/reflex to TrepSure  Bilateral hand pain - Plan: CBC with Differential/Platelet, Comprehensive Metabolic Panel (CMET), TSH, DG Hand Complete Left, DG Hand Complete Right, Ambulatory referral to Vascular Surgery  Screening cholesterol level - Plan: Lipid Panel w/o Chol/HDL Ratio  Screening for HIV (human immunodeficiency virus) - Plan: HIV Antibody (routine testing w rflx)  Encounter for screening mammogram for malignant neoplasm of breast - Plan: MM Digital Screening  Need for hepatitis B screening test - Plan: Hepatitis panel, acute  History of peripheral edema - Plan: Ambulatory referral to Vascular Surgery  History of hallucinations - Plan: Ambulatory referral to Psychiatry   She would like an evaluation by [facility] given history and exam  referral was placed.  Will check labs.  Follow up in one month for gynecology exam/ pelvic/ breast.  Will follow with psychiatry as well given her visions.   Return in about 1 month (around 06/20/2019), or if symptoms worsen or fail to improve, for at any time for any worsening symptoms, Go to Emergency room/ urgent care if worse.  Advised patient call the office or your primary care doctor for an appointment if no improvement within 72 hours or if any symptoms change or worsen at any time  Advised ER or urgent Care if after hours or on weekend. Call 911 for emergency symptoms at any time.Patinet verbalized understanding of all instructions given/reviewed and treatment plan and has no further questions or concerns at this time.    Patient verbalized understanding of all instructions given and denies any further questions at this time.

## 2019-05-24 ENCOUNTER — Telehealth: Payer: Self-pay | Admitting: *Deleted

## 2019-05-24 NOTE — Telephone Encounter (Addendum)
Pt notified. See results note  ----- Message from Fonda Kinder, CMA sent at 05/24/2019  2:30 PM EST ----- Attempted to reach patient there is no answering service,if patient calls back okay for triage nurse to advise. KW

## 2019-05-24 NOTE — Progress Notes (Signed)
Let patient know : Left hand x ray shows possible mild arthritis and otherwise normal. Right hand x ray normal. She is still to go for labs and keep follow up and referral as discussed.

## 2019-05-30 ENCOUNTER — Encounter (INDEPENDENT_AMBULATORY_CARE_PROVIDER_SITE_OTHER): Payer: 59 | Admitting: Vascular Surgery

## 2019-06-20 ENCOUNTER — Encounter: Payer: Self-pay | Admitting: Adult Health

## 2019-11-25 ENCOUNTER — Emergency Department
Admission: EM | Admit: 2019-11-25 | Discharge: 2019-11-25 | Disposition: A | Discharge status: Home / Self Care | Payer: 59

## 2019-11-28 ENCOUNTER — Emergency Department: Admission: EM | Admit: 2019-11-28 | Discharge: 2019-11-28 | Discharge status: Against Medical Advice | Payer: 59

## 2020-07-21 ENCOUNTER — Encounter (HOSPITAL_COMMUNITY): Payer: Self-pay | Admitting: *Deleted

## 2020-07-21 ENCOUNTER — Other Ambulatory Visit: Payer: Self-pay

## 2020-07-21 ENCOUNTER — Ambulatory Visit (HOSPITAL_COMMUNITY): Admission: EM | Admit: 2020-07-21 | Discharge: 2020-07-21 | Discharge status: Against Medical Advice | Payer: 59

## 2020-07-21 ENCOUNTER — Ambulatory Visit (HOSPITAL_COMMUNITY)
Admission: EM | Admit: 2020-07-21 | Discharge: 2020-07-21 | Disposition: A | Discharge status: Home / Self Care | Payer: 59 | Attending: Urgent Care | Admitting: Urgent Care

## 2020-07-21 DIAGNOSIS — L299 Pruritus, unspecified: Secondary | ICD-10-CM | POA: Diagnosis not present

## 2020-07-21 DIAGNOSIS — Z2089 Contact with and (suspected) exposure to other communicable diseases: Secondary | ICD-10-CM

## 2020-07-21 DIAGNOSIS — Z207 Contact with and (suspected) exposure to pediculosis, acariasis and other infestations: Secondary | ICD-10-CM

## 2020-07-21 MED ORDER — PERMETHRIN 5 % EX CREA: TOPICAL_CREAM | CUTANEOUS | 0 refills | Status: DC

## 2020-07-21 MED ORDER — HYDROXYZINE HCL 25 MG PO TABS: 12.5000 mg | ORAL_TABLET | Freq: Three times a day (TID) | ORAL | 0 refills | Status: DC | PRN

## 2020-07-21 NOTE — ED Provider Notes (Signed)
[facility]   MRN: [id number] DOB: [birth year]  Subjective:   Sheila Lang is a 55 y.o. female presenting for having an exposure to scabies.  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  She has started to have more persistent itching.  Unfortunately, when she found out about the patient having scabies, she started going home and using Clorox to clean her skin.  Felt warmth on her skin as she applied the Clorox.  Has not felt any particular rashes or spots, burrows.  No current facility-administered medications for this encounter.  Current Outpatient Medications:  .  Ascorbic Acid (VITAMIN C) 1000 MG tablet, Take 1,000 mg by mouth 2 (two) times daily., Disp: , Rfl:  .  calcium-vitamin D (OSCAL WITH D) 250-125 MG-UNIT tablet, Take 1 tablet by mouth daily., Disp: , Rfl:  .  ibuprofen (ADVIL,MOTRIN) 800 MG tablet, Take 1 tablet (800 mg total) by mouth every 8 (eight) hours as needed., Disp: 30 tablet, Rfl: 0 .  Multiple Vitamin (MULTI-VITAMIN DAILY) TABS, Take by mouth., Disp: , Rfl:  .  Omega-3 Fatty Acids (FISH OIL) 1000 MG CAPS, Take by mouth., Disp: , Rfl:    No Known Allergies  History reviewed. No pertinent past medical history.   Past Surgical History:  Procedure Laterality Date  . OVARIAN CYST REMOVAL      Family History  Family history unknown: Yes    Social History   Tobacco Use  . Smoking status: Never Smoker  . Smokeless tobacco: Never Used  Substance Use Topics  . Alcohol use: No  . Drug use: Never    ROS   Objective:   Vitals: BP (!) 98/46 (BP Location: Right Arm)   Pulse 94   Temp 98.5 F (36.9 C) (Oral)   Resp 18   LMP 07/16/2017   SpO2 100%   Physical Exam Constitutional:      General: She is not in acute distress.    Appearance: Normal appearance. She is well-developed. She is not ill-appearing, toxic-appearing or diaphoretic.  HENT:     Head: Normocephalic and  atraumatic.     Nose: Nose normal.     Mouth/Throat:     Mouth: Mucous membranes are moist.     Pharynx: Oropharynx is clear.  Eyes:     General: No scleral icterus.       Right eye: No discharge.        Left eye: No discharge.     Extraocular Movements: Extraocular movements intact.     Conjunctiva/sclera: Conjunctivae normal.     Pupils: Pupils are equal, round, and reactive to light.  Cardiovascular:     Rate and Rhythm: Normal rate.  Pulmonary:     Effort: Pulmonary effort is normal.  Skin:    General: Skin is warm and dry.     Coloration: Skin is not jaundiced or pale.     Findings: No bruising, erythema, lesion or rash.  Neurological:     General: No focal deficit present.     Mental Status: She is alert and oriented to person, place, and time.  Psychiatric:        Mood and Affect: Mood normal.        Behavior: Behavior normal.        Thought Content: Thought content normal.        Judgment: Judgment normal.       Assessment and Plan :  PDMP not reviewed this encounter.  1. Itching   2. Scabies exposure     Emphasized to patient that she needs to stop using Clorox on her skin.  Recommended that she use hydroxyzine for itching.  Practice general skin hygiene.  Start permethrin cream to cover for exposure.  Recommend she uses only once. Counseled patient on potential for adverse effects with medications prescribed/recommended today, ER and return-to-clinic precautions discussed, patient verbalized understanding.    Wallis Bamberg, [place] 07/21/20 1645

## 2020-07-21 NOTE — ED Triage Notes (Signed)
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 Pt reports itching to most all of her body. Pt also request a work note .

## 2020-07-21 NOTE — ED Notes (Signed)
Called pt to make aware a room is ready for her. Waited for the pt for 5 minutes. I called the pt back and she stated she left and she will be on the way back. I told the pt she will need to check back in.

## 2020-08-06 ENCOUNTER — Emergency Department: Payer: 59

## 2020-08-06 ENCOUNTER — Other Ambulatory Visit: Payer: Self-pay

## 2020-08-06 ENCOUNTER — Emergency Department
Admission: EM | Admit: 2020-08-06 | Discharge: 2020-08-06 | Disposition: A | Discharge status: Home / Self Care | Payer: 59 | Attending: Emergency Medicine | Admitting: Emergency Medicine

## 2020-08-06 ENCOUNTER — Encounter: Payer: Self-pay | Admitting: Emergency Medicine

## 2020-08-06 DIAGNOSIS — M546 Pain in thoracic spine: Secondary | ICD-10-CM | POA: Insufficient documentation

## 2020-08-06 DIAGNOSIS — Y92410 Unspecified street and highway as the place of occurrence of the external cause: Secondary | ICD-10-CM | POA: Insufficient documentation

## 2020-08-06 DIAGNOSIS — M545 Low back pain, unspecified: Secondary | ICD-10-CM | POA: Insufficient documentation

## 2020-08-06 DIAGNOSIS — M542 Cervicalgia: Secondary | ICD-10-CM | POA: Diagnosis present

## 2020-08-06 DIAGNOSIS — M7918 Myalgia, other site: Secondary | ICD-10-CM

## 2020-08-06 MED ORDER — HYDROCODONE-ACETAMINOPHEN 5-325 MG PO TABS: 1.0000 | ORAL_TABLET | Freq: Four times a day (QID) | ORAL | 0 refills | Status: AC | PRN

## 2020-08-06 MED ORDER — NAPROXEN 500 MG PO TABS: 500.0000 mg | ORAL_TABLET | Freq: Two times a day (BID) | ORAL | 0 refills | Status: DC
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
## 2020-08-06 NOTE — Discharge Instructions (Addendum)
Follow-up with your primary care provider if any continued problems or concerns.  Begin taking the anti-inflammatory every day with food.  The pain medication hydrocodone you cannot take while driving or operating machinery as it could cause drowsiness and increase your risk for injury.  You may use ice or heat to your muscles as needed for discomfort.  Continue to move frequently to avoid stiffness.

## 2020-08-20 ENCOUNTER — Other Ambulatory Visit: Payer: Self-pay

## 2020-08-20 ENCOUNTER — Ambulatory Visit: Payer: 59 | Admitting: Adult Health

## 2020-08-20 ENCOUNTER — Encounter: Payer: Self-pay | Admitting: Adult Health

## 2020-08-20 VITALS — BP 98/54 | HR 94 | Temp 96.9°F | Ht 60.0 in | Wt 98.6 lb

## 2020-08-20 DIAGNOSIS — M898X9 Other specified disorders of bone, unspecified site: Secondary | ICD-10-CM | POA: Diagnosis not present

## 2020-08-20 DIAGNOSIS — M79603 Pain in arm, unspecified: Secondary | ICD-10-CM | POA: Diagnosis not present

## 2020-08-20 DIAGNOSIS — D649 Anemia, unspecified: Secondary | ICD-10-CM

## 2020-08-20 DIAGNOSIS — Z0279 Encounter for issue of other medical certificate: Secondary | ICD-10-CM

## 2020-08-20 DIAGNOSIS — M79641 Pain in right hand: Secondary | ICD-10-CM

## 2020-08-20 DIAGNOSIS — M79642 Pain in left hand: Secondary | ICD-10-CM

## 2020-08-20 DIAGNOSIS — M79606 Pain in leg, unspecified: Secondary | ICD-10-CM

## 2020-08-20 NOTE — Patient Instructions (Signed)
Joint Pain  Joint pain can be caused by many things. It is likely to go away if you follow instructions from your doctor for taking care of yourself at home. Sometimes, you may need more treatment. Follow these instructions at home: Managing pain, stiffness, and swelling  If told, put ice on the painful area. To do this: ? If you have a removable elastic bandage, sling, or splint, take it off as told by your doctor. ? Put ice in a plastic bag. ? Place a towel between your skin and the bag. ? Leave the ice on for 20 minutes, 2-3 times a day. ? Take off the ice if your skin turns bright red. This is very important. If you cannot feel pain, heat, or cold, you have a greater risk of damage to the area.  Move your fingers or toes below the painful joint often.  Raise the painful joint above the level of your heart while you are sitting or lying down.  If told, put heat on the painful area. Do this as often as told by your doctor. Use the heat source that your doctor recommends, such as a moist heat pack or a heating pad. ? Place a towel between your skin and the heat source. ? Leave the heat on for 20-30 minutes. ? Take off the heat if your skin gets bright red. This is especially important if you are unable to feel pain, heat, or cold. You may have a greater risk of getting burned.      Activity  Rest the painful joint for as long as told by your doctor. Do not do things that cause pain or make your pain worse.  Begin exercising or stretching the affected area, as told by your doctor. Ask your doctor what types of exercise are safe for you.  Return to your normal activities when your doctor says that it is safe. If you have an elastic bandage, sling, or splint:  Wear it as told by your doctor. Take it only as told by your doctor.  Loosen it your fingers or toes below the joint: ? Tingle. ? Become numb. ? Get cold and blue.  Keep it clean.  Ask your doctor if you should take it  off before bathing.  If it is not waterproof: ? Do not let it get wet. ? Cover it with a watertight covering when you take a bath or shower. General instructions  Take over-the-counter and prescription medicines only as told by your doctor. This may include medicines taken by mouth or applied to the skin.  Do not smoke or use any products that contain nicotine or tobacco. If you need help quitting, ask your doctor.  Keep all follow-up visits as told by your doctor. This is important. Contact a doctor if:  You have pain that gets worse and does not get better with medicine.  Your joint pain does not get better in 3 days.  You have more bruising or swelling.  You have a fever.  You lose 10 lb (4.5 kg) or more without trying. Get help right away if:  You cannot move the joint.  Your fingers or toes tingle, become numb. or get cold and blue.  You have a fever along with a joint that is red, warm, and swollen. Summary  Joint pain can be caused by many things. It often goes away if you follow instructions from your doctor for taking care of yourself at home.  Rest the painful joint   for as long as told. Do not do things that cause pain or make your pain worse.  Take over-the-counter and prescription medicines only as told by your doctor. This information is not intended to replace advice given to you by your health care provider. Make sure you discuss any questions you have with your health care provider. Document Revised: 07/20/2019 Document Reviewed: 07/20/2019 Elsevier Patient Education  2021 Elsevier Inc.  

## 2020-08-20 NOTE — Progress Notes (Signed)
Acute Office Visit  Subjective:    Patient ID: Sheila Lang, female    DOB: 01/31/1966, 55 y.o.   MRN: [id number]  Chief Complaint  Patient presents with  . FMLA Paperwork    Pt came in for FMLA paperwork    HPI Patient is in today for follow up she has not been seen for over year. She wants FMLA forms, she has difficulty using arms. She has seen Rheumatology.  She has also seen Dr. Melina Copa orthopedics for hand pain and was sent to rheumatology.   She denies any other concerns she also reports she will loose her job if she does not get the FMLA forms filled our prior to this afternoon.   Patient  denies any fever, body aches,chills, rash, chest pain, shortness of breath, nausea, vomiting, or diarrhea.   Denies dizziness, lightheadedness, pre syncopal or syncopal episodes.    History reviewed. No pertinent past medical history.  Past Surgical History:  Procedure Laterality Date  . OVARIAN CYST REMOVAL      Family History  Family history unknown: Yes    Social History   Socioeconomic History  . Marital status: Single    Spouse name: Not on file  . Number of children: Not on file  . Years of education: Not on file  . Highest education level: Not on file  Occupational History  . Not on file  Tobacco Use  . Smoking status: Never Smoker  . Smokeless tobacco: Never Used  Substance and Sexual Activity  . Alcohol use: No  . Drug use: Never  . Sexual activity: Not on file  Other Topics Concern  . Not on file  Social History Narrative  . Not on file   Social Determinants of Health   Financial Resource Strain: Not on file  Food Insecurity: Not on file  Transportation Needs: Not on file  Physical Activity: Not on file  Stress: Not on file  Social Connections: Not on file  Intimate Partner Violence: Not on file    Outpatient Medications Prior to Visit  Medication Sig Dispense Refill  . Ascorbic Acid (VITAMIN C) 1000 MG tablet Take 1,000 mg by mouth 2 (two)  times daily.    . calcium-vitamin D (OSCAL WITH D) 250-125 MG-UNIT tablet Take 1 tablet by mouth daily.    Marland Kitchen HYDROcodone-acetaminophen (NORCO/VICODIN) 5-325 MG tablet Take 1 tablet by mouth every 6 (six) hours as needed for moderate pain. 8 tablet 0  . hydrOXYzine (ATARAX/VISTARIL) 25 MG tablet Take 0.5-1 tablets (12.5-25 mg total) by mouth every 8 (eight) hours as needed for itching. 30 tablet 0  . Multiple Vitamin (MULTI-VITAMIN DAILY) TABS Take by mouth.    . naproxen (NAPROSYN) 500 MG tablet Take 1 tablet (500 mg total) by mouth 2 (two) times daily with a meal. 20 tablet 0  . Omega-3 Fatty Acids (FISH OIL) 1000 MG CAPS Take by mouth.    . methylPREDNISolone (MEDROL DOSEPAK) 4 MG TBPK tablet See admin instructions. follow package directions     No facility-administered medications prior to visit.    No Known Allergies  Review of Systems  Constitutional: Negative.   HENT: Negative.   Respiratory: Negative.   Cardiovascular: Negative.   Gastrointestinal: Negative.   Genitourinary: Negative.   Musculoskeletal: Positive for arthralgias.  Skin: Negative.   Neurological: Negative.   Psychiatric/Behavioral: Negative.        Objective:    Physical Exam Vitals reviewed.  Constitutional:      Appearance: Normal appearance.  Comments: Thin in appearence. Patient appers well, not sickly. Speaking in complete sentences. Patient moves on and off of exam table and in room without difficulty. Gait is normal in hall and in room. Patient is oriented to person place time and situation. Patient answers questions appropriately and engages eye contact and verbal dialect with provider.   HENT:     Head: Normocephalic and atraumatic.     Right Ear: External ear normal.     Left Ear: External ear normal.     Nose: No congestion.     Mouth/Throat:     Pharynx: Oropharynx is clear.  Eyes:     Conjunctiva/sclera: Conjunctivae normal.  Cardiovascular:     Rate and Rhythm: Normal rate and  regular rhythm.     Pulses: Normal pulses.     Heart sounds: Normal heart sounds. No murmur heard. No friction rub. No gallop.   Pulmonary:     Effort: Pulmonary effort is normal.     Breath sounds: Normal breath sounds.  Abdominal:     Palpations: Abdomen is soft.  Musculoskeletal:        General: Normal range of motion.     Cervical back: Normal range of motion and neck supple.  Neurological:     Mental Status: She is alert and oriented to person, place, and time.  Psychiatric:        Mood and Affect: Mood normal.        Behavior: Behavior normal.        Thought Content: Thought content normal.        Judgment: Judgment normal.     BP (!) 98/54 (BP Location: Left Arm, Patient Position: Sitting)   Pulse 94   Temp (!) 96.9 F (36.1 C)   Ht 5' (1.524 m)   Wt 98 lb 9.6 oz (44.7 kg)   LMP 07/16/2017   SpO2 99%   BMI 19.26 kg/m  Wt Readings from Last 3 Encounters:  08/20/20 98 lb 9.6 oz (44.7 kg)  08/06/20 114 lb 10.2 oz (52 kg)  05/23/19 116 lb 3.2 oz (52.7 kg)    Health Maintenance Due  Topic Date Due  . Hepatitis C Screening  Never done  . TETANUS/TDAP  Never done  . PAP SMEAR-Modifier  Never done  . COLONOSCOPY (Pts 45-4yr Insurance coverage will need to be confirmed)  Never done  . MAMMOGRAM  05/11/2015    There are no preventive care reminders to display for this patient.   No results found for: TSH No results found for: WBC, HGB, HCT, MCV, PLT No results found for: NA, K, CHLORIDE, CO2, GLUCOSE, BUN, CREATININE, BILITOT, ALKPHOS, AST, ALT, PROT, ALBUMIN, CALCIUM, ANIONGAP, EGFR, GFR No results found for: CHOL No results found for: HDL No results found for: LDLCALC No results found for: TRIG No results found for: CHOLHDL No results found for: HGBA1C     Assessment & Plan:   Problem List Items Addressed This Visit      Other   Bilateral hand pain   Relevant Orders   Ambulatory referral to Rheumatology   Bone pain   Relevant Orders   Ambulatory  referral to Rheumatology   Upper and lower extremity pain - Primary   Relevant Orders   Ambulatory referral to Rheumatology    Other Visit Diagnoses    Anemia, unspecified type       Relevant Orders   Basic Metabolic Panel (BMET)   CBC with Differential/Platelet   Fe+TIBC+Fer   TSH  Upper and lower extremity pain - Plan: Ambulatory referral to Rheumatology  Bilateral hand pain - Plan: Ambulatory referral to Rheumatology  Bone pain - Plan: Ambulatory referral to Rheumatology  Anemia, unspecified type - Plan: Basic Metabolic Panel (BMET), CBC with Differential/Platelet, Fe+TIBC+Fer, TSH, CANCELED: CBC with Differential/Platelet, CANCELED: TSH, CANCELED: Fe+TIBC+Fer, CANCELED: Basic Metabolic Panel (BMET)  Chronic pain, she requests a new referral to a rheumatologist, per note it appears she is unable to go back to her current rheumatologist due to being rude to provider.   Orders Placed This Encounter  Procedures  . Basic Metabolic Panel (BMET)  . CBC with Differential/Platelet  . Fe+TIBC+Fer  . TSH  . Ambulatory referral to Rheumatology  placed referral as above. Labs ordered for today. She will need more labs at her physical  in the near future.  She is due for physical with this provider she will schedule in the near future.  Keep orthopedics follow up's as  Advised as well.   No orders of the defined types were placed in this encounter. FMLA forms completed and will be sent up front for patient pick up gave from 08/20/20 to 09/19/20 and then will have her rheumatologist determine if she needs further FMLA leave.  Red Flags discussed. The patient was given clear instructions to go to ER or return to medical center if any red flags develop, symptoms do not improve, worsen or new problems develop. They verbalized understanding.   Return in about 1 month (around 09/20/2020), or if symptoms worsen or fail to improve, for at any time for any worsening symptoms, Go to Emergency room/  urgent care if worse.   Marcille Buffy, FNP

## 2020-08-21 ENCOUNTER — Ambulatory Visit: Payer: 59 | Admitting: Adult Health

## 2020-08-22 ENCOUNTER — Telehealth: Payer: Self-pay

## 2020-08-22 NOTE — Telephone Encounter (Signed)
Attempted to contact [facility] to give message about referral being declined. Voicemail was not connected to leave a message.

## 2020-08-23 NOTE — Telephone Encounter (Signed)
Attempted to contact patient. Voicemail was not connected to leave a message.

## 2020-08-24 NOTE — Telephone Encounter (Signed)
Attempted to contact patient. Voicemail was not connected to leave a message. 3rd attempt to contact patient, Letter has been sent to patient with note from provider.  Flinchum, Eula Fried, FNP  Hulan Fray, CMA Rheumatology - Dr. Abelina Bachelor and Dr. Dimple Casey declined patients referral. She is already established with a rheumatologist I recommend she follow back up with them or let us know of another rheumatology office she would like referral to.

## 2020-09-20 ENCOUNTER — Ambulatory Visit: Payer: 59 | Admitting: Adult Health

## 2021-01-13 ENCOUNTER — Ambulatory Visit (HOSPITAL_COMMUNITY)
Admission: EM | Admit: 2021-01-13 | Discharge: 2021-01-13 | Disposition: A | Discharge status: Home / Self Care | Payer: 59 | Attending: Physician Assistant | Admitting: Physician Assistant

## 2021-01-13 ENCOUNTER — Other Ambulatory Visit: Payer: Self-pay

## 2021-01-13 ENCOUNTER — Encounter (HOSPITAL_COMMUNITY): Payer: Self-pay | Admitting: *Deleted

## 2021-01-13 DIAGNOSIS — M7989 Other specified soft tissue disorders: Secondary | ICD-10-CM

## 2021-01-13 DIAGNOSIS — L03115 Cellulitis of right lower limb: Secondary | ICD-10-CM | POA: Diagnosis not present

## 2021-01-13 DIAGNOSIS — M79651 Pain in right thigh: Secondary | ICD-10-CM

## 2021-01-13 DIAGNOSIS — R8290 Unspecified abnormal findings in urine: Secondary | ICD-10-CM

## 2021-01-13 DIAGNOSIS — R3915 Urgency of urination: Secondary | ICD-10-CM

## 2021-01-13 LAB — POCT URINALYSIS DIPSTICK, ED / UC
Bilirubin Urine: NEGATIVE
Glucose, UA: NEGATIVE mg/dL
Hgb urine dipstick: NEGATIVE
Ketones, ur: NEGATIVE mg/dL
Nitrite: NEGATIVE
Protein, ur: NEGATIVE mg/dL
Specific Gravity, Urine: 1.015 (ref 1.005–1.030)
Urobilinogen, UA: 0.2 mg/dL (ref 0.0–1.0)
pH: 7 (ref 5.0–8.0)

## 2021-01-13 MED ORDER — CEPHALEXIN 500 MG PO CAPS: 500.0000 mg | ORAL_CAPSULE | Freq: Three times a day (TID) | ORAL | 0 refills | Status: DC

## 2021-01-13 NOTE — Discharge Instructions (Signed)
I believe that you have an infection of your skin so we are treating you for cellulitis.  The antibiotic we prescribed will cover for urinary tract infection as well as cellulitis.  Please take this 3 times a day for minimum 7 days.  It is important you get an ultrasound of your leg to rule out a blood clot.  If you develop any chest pain, shortness of breath, worsening pain overnight you need to go to the emergency room.  You can use over-the-counter medications for symptom relief.  As we discussed, if you continue to have urge incontinence may be worthwhile to discuss this with your primary care provider to consider additional treatment options.  We did send your urine off for culture and if need to change antibiotics we will contact you.

## 2021-01-13 NOTE — ED Triage Notes (Signed)
Pt reports cellulitis to rt upper thigh that started last week  and swelling  to RT foot. Pt also reports urgency and voids on self.

## 2021-01-13 NOTE — ED Triage Notes (Signed)
Pt reports multiple problems from pin worms to knee pain. Pt wants to be treated for pinworms.

## 2021-01-13 NOTE — ED Provider Notes (Signed)
[facility]    CSN: [id number] Arrival date & time: 01/13/21  1327      History   Chief Complaint Chief Complaint  Patient presents with   Cellulitis    HPI Sheila Lang is a 55 y.o. female.   Patient presents today with a weeklong history of increasing swelling/redness/pain of her right medial thigh.  She reports pain is rated 8 on a 0-10 pain scale, described as throbbing, worse with palpation, no alleviating factors identified.  She denies any fevers, nausea, vomiting, headache, dizziness.  She has tried over-the-counter analgesics without improvement of symptoms.  She denies any recent antibiotic use.  Denies history of diabetes or immunosuppression.  Denies history of recurrent skin infections.  She denies any history of VTE event.  She denies any recent COVID-19 infection, hospitalization, recent surgery, immobilization, exogenous hormone use, history of malignancy.  She denies any chest pain or shortness of breath.  She is having difficulty with daily activities as result of symptoms and is requesting work excuse note today.  In addition, patient reports a 12-month history of urge incontinence.  She reports significant urinary urgency to the point that she has incontinence episodes.  Denies any dysuria, hematuria, abdominal pain, vaginal symptoms, urinary frequency.  She denies any recent antibiotic use.  She has not been evaluated for this condition in the past.  She has not tried any medications or pelvic floor training.  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   History reviewed. No pertinent past medical history.  Patient Active Problem List   Diagnosis Date Noted   History of peripheral edema 05/23/2019   Screening for HIV (human immunodeficiency virus) 05/23/2019   Bilateral hand pain 05/23/2019   Bone pain 05/23/2019   History of vitamin D deficiency 05/23/2019    Need for hepatitis C screening test 05/23/2019   Upper and lower extremity pain 05/23/2019    Past Surgical History:  Procedure Laterality Date   OVARIAN CYST REMOVAL      OB History   No obstetric history on file.      Home Medications    Prior to Admission medications   Medication Sig Start Date End Date Taking? Authorizing Provider  cephALEXin (KEFLEX) 500 MG capsule Take 1 capsule (500 mg total) by mouth 3 (three) times daily. 01/13/21  Yes Arthurine Oleary, Noberto Retort, PA-C  Ascorbic Acid (VITAMIN C) 1000 MG tablet Take 1,000 mg by mouth 2 (two) times daily.    [provider]  calcium-vitamin D (OSCAL WITH D) 250-125 MG-UNIT tablet Take 1 tablet by mouth daily.    [provider]  HYDROcodone-acetaminophen (NORCO/VICODIN) 5-325 MG tablet Take 1 tablet by mouth every 6 (six) hours as needed for moderate pain. 08/06/20 08/06/21  Tommi Rumps, PA-C  hydrOXYzine (ATARAX/VISTARIL) 25 MG tablet Take 0.5-1 tablets (12.5-25 mg total) by mouth every 8 (eight) hours as needed for itching. 07/21/20   Wallis Bamberg, PA-C  Multiple Vitamin (MULTI-VITAMIN DAILY) TABS Take by mouth.    [provider]  naproxen (NAPROSYN) 500 MG tablet Take 1 tablet (500 mg total) by mouth 2 (two) times daily with a meal. 08/06/20   Bridget Hartshorn L, PA-C  Omega-3 Fatty Acids (FISH OIL) 1000 MG CAPS Take by mouth.    [provider]    Family History Family History  Family history unknown: Yes    Social History Social History  Tobacco Use   Smoking status: Never   Smokeless tobacco: Never  Substance Use Topics   Alcohol use: No   Drug use: Never     Allergies   Patient has no known allergies.   Review of Systems Review of Systems  Constitutional:  Positive for activity change. Negative for appetite change, fatigue and fever.  Respiratory:  Negative for cough and shortness of breath.   Cardiovascular:  Positive for leg swelling. Negative for chest pain and  palpitations.  Gastrointestinal:  Negative for abdominal pain, diarrhea, nausea and vomiting.  Genitourinary:  Positive for urgency. Negative for dysuria, frequency, vaginal bleeding, vaginal discharge and vaginal pain.  Musculoskeletal:  Positive for myalgias. Negative for arthralgias and back pain.  Skin:  Positive for color change.  Neurological:  Negative for dizziness, weakness, light-headedness, numbness and headaches.    Physical Exam Triage Vital Signs ED Triage Vitals  Enc Vitals Group     BP 01/13/21 1418 (!) 96/49     Pulse Rate 01/13/21 1418 100     Resp 01/13/21 1418 20     Temp 01/13/21 1418 99 F (37.2 C)     Temp src --      SpO2 01/13/21 1418 100 %     Weight --      Height --      Head Circumference --      Peak Flow --      Pain Score 01/13/21 1413 8     Pain Loc --      Pain Edu? --      Excl. in [place]? --    No data found.  Updated Vital Signs BP (!) 95/46 (BP Location: Right Arm)   Pulse 88   Temp 98.9 F (37.2 C)   Resp 18   LMP 07/16/2017   SpO2 100%   Visual Acuity Right Eye Distance:   Left Eye Distance:   Bilateral Distance:    Right Eye Near:   Left Eye Near:    Bilateral Near:     Physical Exam Vitals reviewed.  Constitutional:      General: She is awake. She is not in acute distress.    Appearance: Normal appearance. She is well-developed. She is not ill-appearing.     Comments: Very pleasant female appears stated age no acute distress sitting comfortably in exam room  HENT:     Head: Normocephalic and atraumatic.  Cardiovascular:     Rate and Rhythm: Normal rate and regular rhythm.     Heart sounds: Normal heart sounds, S1 normal and S2 normal. No murmur heard. Pulmonary:     Effort: Pulmonary effort is normal.     Breath sounds: Normal breath sounds. No wheezing, rhonchi or rales.     Comments: Clear to auscultation bilaterally Abdominal:     Palpations: Abdomen is soft.     Tenderness: There is no abdominal tenderness.  There is no right CVA tenderness, left CVA tenderness, guarding or rebound.  Musculoskeletal:     Right upper leg: Swelling, edema and tenderness present. No bony tenderness.     Comments: Right leg: Swelling, erythema, edema noted right medial thigh.  Area is warm to touch and tender to palpation.  No cords noted.  No wound or bleeding noted.  Psychiatric:        Behavior: Behavior is cooperative.     UC Treatments / Results  Labs (all labs ordered are listed, but only abnormal results are displayed) Labs Reviewed  POCT URINALYSIS  DIPSTICK, ED / UC - Abnormal; Notable for the following components:      Result Value   Leukocytes,Ua SMALL (*)    All other components within normal limits  URINE CULTURE    EKG   Radiology No results found.  Procedures Procedures (including critical care time)  Medications Ordered in UC Medications - No data to display  Initial Impression / Assessment and Plan / UC Course  I have reviewed the triage vital signs and the nursing notes.  Pertinent labs & imaging results that were available during my care of the patient were reviewed by me and considered in my medical decision making (see chart for details).     Suspect cellulitis as etiology of symptoms, however, given clinical presentation cannot rule out DVT.  Unfortunately, we are unable to obtain outpatient DVT ultrasound today; patient is stable for discharge (has chronically low blood pressure which is at baseline) so will schedule outpatient DVT.  Discussed that if she has any worsening symptoms including chest pain or shortness of breath you need to go to the emergency room.  Will cover with Keflex.  Patient did request pain medication for both acute thigh pain as well as chronic knee pain.  Recommend she use over-the-counter medication for symptom management.  Discussed alarm symptoms that warrant emergent evaluation.  Strict return precautions given to which she expressed  understanding.  Urine showed trace leukocyte esterase.  Discussed that Keflex will cover for urinary tract infection.  Urine culture was obtained-results pending.  Discussed alarm symptoms that warrant emergent evaluation.  I suspect symptoms are more related to urge incontinence but will defer treatment to PCP.  Discussed alarm symptoms that warrant emergent evaluation.  Strict return precautions given to which she expressed understanding.  Final Clinical Impressions(s) / UC Diagnoses   Final diagnoses:  Pain in right thigh  Right leg swelling  Cellulitis of right lower extremity  Urinary urgency  Abnormal urinalysis     Discharge Instructions      I believe that you have an infection of your skin so we are treating you for cellulitis.  The antibiotic we prescribed will cover for urinary tract infection as well as cellulitis.  Please take this 3 times a day for minimum 7 days.  It is important you get an ultrasound of your leg to rule out a blood clot.  If you develop any chest pain, shortness of breath, worsening pain overnight you need to go to the emergency room.  You can use over-the-counter medications for symptom relief.  As we discussed, if you continue to have urge incontinence may be worthwhile to discuss this with your primary care provider to consider additional treatment options.  We did send your urine off for culture and if need to change antibiotics we will contact you.     ED Prescriptions     Medication Sig Dispense Auth. Provider   cephALEXin (KEFLEX) 500 MG capsule Take 1 capsule (500 mg total) by mouth 3 (three) times daily. 21 capsule Marilynne Dupuis K, PA-C      PDMP not reviewed this encounter.   Jeani Hawking, PA-C 01/13/21 1545

## 2021-01-14 LAB — URINE CULTURE: Culture: <10000 — AB

## 2021-01-17 ENCOUNTER — Ambulatory Visit (HOSPITAL_COMMUNITY): Admission: RE | Admit: 2021-01-17 | Payer: 59 | Source: Ambulatory Visit

## 2021-02-18 DIAGNOSIS — R110 Nausea: Secondary | ICD-10-CM | POA: Diagnosis not present

## 2021-02-18 DIAGNOSIS — Z20822 Contact with and (suspected) exposure to covid-19: Secondary | ICD-10-CM | POA: Insufficient documentation

## 2021-02-18 DIAGNOSIS — R945 Abnormal results of liver function studies: Secondary | ICD-10-CM | POA: Insufficient documentation

## 2021-02-18 DIAGNOSIS — N390 Urinary tract infection, site not specified: Secondary | ICD-10-CM | POA: Diagnosis not present

## 2021-02-18 DIAGNOSIS — M2550 Pain in unspecified joint: Secondary | ICD-10-CM | POA: Diagnosis not present

## 2021-02-18 DIAGNOSIS — M791 Myalgia, unspecified site: Secondary | ICD-10-CM | POA: Diagnosis present

## 2021-02-19 ENCOUNTER — Emergency Department
Admission: EM | Admit: 2021-02-19 | Discharge: 2021-02-19 | Disposition: A | Discharge status: Home / Self Care | Payer: 59 | Attending: Emergency Medicine | Admitting: Emergency Medicine

## 2021-02-19 ENCOUNTER — Emergency Department: Payer: 59

## 2021-02-19 ENCOUNTER — Other Ambulatory Visit: Payer: Self-pay

## 2021-02-19 ENCOUNTER — Encounter: Payer: Self-pay | Admitting: Emergency Medicine

## 2021-02-19 DIAGNOSIS — R110 Nausea: Secondary | ICD-10-CM

## 2021-02-19 DIAGNOSIS — N390 Urinary tract infection, site not specified: Secondary | ICD-10-CM

## 2021-02-19 DIAGNOSIS — R945 Abnormal results of liver function studies: Secondary | ICD-10-CM

## 2021-02-19 DIAGNOSIS — M2550 Pain in unspecified joint: Secondary | ICD-10-CM

## 2021-02-19 LAB — CBC WITH DIFFERENTIAL/PLATELET
Abs Immature Granulocytes: 0.06 10*3/uL (ref 0.00–0.07)
Basophils Absolute: 0 10*3/uL (ref 0.0–0.1)
Basophils Relative: 0 %
Eosinophils Absolute: 0.2 10*3/uL (ref 0.0–0.5)
Eosinophils Relative: 3 %
HCT: 29.5 % — ABNORMAL LOW (ref 36.0–46.0)
Hemoglobin: 9.5 g/dL — ABNORMAL LOW (ref 12.0–15.0)
Immature Granulocytes: 1 %
Lymphocytes Relative: 35 %
Lymphs Abs: 2.1 10*3/uL (ref 0.7–4.0)
MCH: 25.8 pg — ABNORMAL LOW (ref 26.0–34.0)
MCHC: 32.2 g/dL (ref 30.0–36.0)
MCV: 80.2 fL (ref 80.0–100.0)
Monocytes Absolute: 0.4 10*3/uL (ref 0.1–1.0)
Monocytes Relative: 6 %
Neutro Abs: 3.3 10*3/uL (ref 1.7–7.7)
Neutrophils Relative %: 55 %
Platelets: 422 10*3/uL — ABNORMAL HIGH (ref 150–400)
RBC: 3.68 MIL/uL — ABNORMAL LOW (ref 3.87–5.11)
RDW: 19.3 % — ABNORMAL HIGH (ref 11.5–15.5)
WBC: 5.9 10*3/uL (ref 4.0–10.5)
nRBC: 0 % (ref 0.0–0.2)

## 2021-02-19 LAB — COMPREHENSIVE METABOLIC PANEL
ALT: 248 U/L — ABNORMAL HIGH (ref 0–44)
AST: 289 U/L — ABNORMAL HIGH (ref 15–41)
Albumin: 2.7 g/dL — ABNORMAL LOW (ref 3.5–5.0)
Alkaline Phosphatase: 59 U/L (ref 38–126)
Anion gap: 5 (ref 5–15)
BUN: 15 mg/dL (ref 6–20)
CO2: 26 mmol/L (ref 22–32)
Calcium: 9.7 mg/dL (ref 8.9–10.3)
Chloride: 102 mmol/L (ref 98–111)
Creatinine, Ser: 0.51 mg/dL (ref 0.44–1.00)
GFR, Estimated: >60 mL/min (ref >60)
Glucose, Bld: 121 mg/dL — ABNORMAL HIGH (ref 70–99)
Potassium: 4.4 mmol/L (ref 3.5–5.1)
Sodium: 133 mmol/L — ABNORMAL LOW (ref 135–145)
Total Bilirubin: 0.5 mg/dL (ref 0.3–1.2)
Total Protein: 8.3 g/dL — ABNORMAL HIGH (ref 6.5–8.1)

## 2021-02-19 LAB — URINALYSIS, ROUTINE W REFLEX MICROSCOPIC
Bacteria, UA: NONE SEEN
Bilirubin Urine: NEGATIVE
Glucose, UA: NEGATIVE mg/dL
Hgb urine dipstick: NEGATIVE
Ketones, ur: NEGATIVE mg/dL
Nitrite: NEGATIVE
Protein, ur: 30 mg/dL — AB
Specific Gravity, Urine: 1.023 (ref 1.005–1.030)
pH: 5 (ref 5.0–8.0)

## 2021-02-19 LAB — HEPATIC FUNCTION PANEL
ALT: 231 U/L — ABNORMAL HIGH (ref 0–44)
AST: 264 U/L — ABNORMAL HIGH (ref 15–41)
Albumin: 2.6 g/dL — ABNORMAL LOW (ref 3.5–5.0)
Alkaline Phosphatase: 54 U/L (ref 38–126)
Bilirubin, Direct: <0.1 mg/dL (ref 0.0–0.2)
Total Bilirubin: 0.6 mg/dL (ref 0.3–1.2)
Total Protein: 8 g/dL (ref 6.5–8.1)

## 2021-02-19 LAB — RESP PANEL BY RT-PCR (FLU A&B, COVID) ARPGX2
Influenza A by PCR: NEGATIVE
Influenza B by PCR: NEGATIVE
SARS Coronavirus 2 by RT PCR: NEGATIVE

## 2021-02-19 LAB — C-REACTIVE PROTEIN: CRP: 2 mg/dL — ABNORMAL HIGH (ref <1.0)

## 2021-02-19 LAB — TROPONIN I (HIGH SENSITIVITY)
Troponin I (High Sensitivity): 35 ng/L — ABNORMAL HIGH (ref <18)
Troponin I (High Sensitivity): 34 ng/L — ABNORMAL HIGH (ref <18)

## 2021-02-19 LAB — CK
Total CK: 5795 U/L — ABNORMAL HIGH (ref 38–234)
Total CK: 5747 U/L — ABNORMAL HIGH (ref 38–234)

## 2021-02-19 LAB — ACETAMINOPHEN LEVEL: Acetaminophen (Tylenol), Serum: <10 ug/mL — ABNORMAL LOW (ref 10–30)

## 2021-02-19 LAB — LIPASE, BLOOD: Lipase: 34 U/L (ref 11–51)

## 2021-02-19 LAB — SEDIMENTATION RATE: Sed Rate: 87 mm/hr — ABNORMAL HIGH (ref 0–30)

## 2021-02-19 MED ORDER — SODIUM CHLORIDE 0.9 % IV BOLUS
1000.0000 mL | Freq: Once | INTRAVENOUS | Status: AC
  Administered 2021-02-19: 1000 mL via INTRAVENOUS

## 2021-02-19 MED ORDER — SODIUM CHLORIDE 0.9 % IV SOLN
1.0000 g | Freq: Once | INTRAVENOUS | Status: AC
  Administered 2021-02-19: 1 g via INTRAVENOUS
  Filled 2021-02-19: qty 10

## 2021-02-19 MED ORDER — ACETYLCYSTEINE LOAD VIA INFUSION: 150.0000 mg/kg | Freq: Once | INTRAVENOUS | Status: DC

## 2021-02-19 MED ORDER — CEPHALEXIN 500 MG PO CAPS: 500.0000 mg | ORAL_CAPSULE | Freq: Two times a day (BID) | ORAL | 0 refills | Status: AC

## 2021-02-19 MED ORDER — ONDANSETRON 4 MG PO TBDP: 4.0000 mg | ORAL_TABLET | Freq: Three times a day (TID) | ORAL | 0 refills | Status: DC | PRN

## 2021-02-19 MED ORDER — IBUPROFEN 600 MG PO TABS: 600.0000 mg | ORAL_TABLET | Freq: Four times a day (QID) | ORAL | 0 refills | Status: AC | PRN

## 2021-02-19 NOTE — ED Provider Notes (Signed)
[facility] Emergency Department Provider Note  ____________________________________________   Event Date/Time   First MD Initiated Contact with Patient 02/19/21 1039     (approximate)  I have reviewed the triage vital signs and the nursing notes.   HISTORY  Chief Complaint Generalized Body Aches, Diarrhea, and Vomiting    HPI Sheila Lang is a 55 y.o. female who comes in with pain all over for 3 months.  Patient reports having generalized body aches that are in her joints, constant, nothing makes them better or worse.  States that she went to the [facility] who told her to take ibuprofen and Tylenol.  Patient reports taking 4500 mg of Tylenol from Wednesday through Sunday.  She stopped taking the Tylenol on Monday and Tuesday secondary to not feeling well including some diarrhea and some nausea and vomiting.  She denies any abdominal pain.  She states that the diarrhea has lessened and she is only had 1 time today.  No vomiting today.  She is requesting that we test her for lupus.  She does report some increased urination but denies any dysuria.  Denies that the joints are more swollen just that they hurt.     History reviewed. No pertinent past medical history.  Patient Active Problem List   Diagnosis Date Noted   History of peripheral edema 05/23/2019   Screening for HIV (human immunodeficiency virus) 05/23/2019   Bilateral hand pain 05/23/2019   Bone pain 05/23/2019   History of vitamin D deficiency 05/23/2019   Need for hepatitis C screening test 05/23/2019   Upper and lower extremity pain 05/23/2019    Past Surgical History:  Procedure Laterality Date   OVARIAN CYST REMOVAL      Prior to Admission medications   Medication Sig Start Date End Date Taking? Authorizing Provider  Ascorbic Acid (VITAMIN C) 1000 MG tablet Take 1,000 mg by mouth 2 (two) times daily.    [provider]  calcium-vitamin D (OSCAL WITH D) 250-125 MG-UNIT  tablet Take 1 tablet by mouth daily.    [provider]  cephALEXin (KEFLEX) 500 MG capsule Take 1 capsule (500 mg total) by mouth 3 (three) times daily. 01/13/21   Raspet, Noberto Retort, PA-C  HYDROcodone-acetaminophen (NORCO/VICODIN) 5-325 MG tablet Take 1 tablet by mouth every 6 (six) hours as needed for moderate pain. 08/06/20 08/06/21  Tommi Rumps, PA-C  hydrOXYzine (ATARAX/VISTARIL) 25 MG tablet Take 0.5-1 tablets (12.5-25 mg total) by mouth every 8 (eight) hours as needed for itching. 07/21/20   Wallis Bamberg, PA-C  Multiple Vitamin (MULTI-VITAMIN DAILY) TABS Take by mouth.    [provider]  naproxen (NAPROSYN) 500 MG tablet Take 1 tablet (500 mg total) by mouth 2 (two) times daily with a meal. 08/06/20   Bridget Hartshorn L, PA-C  Omega-3 Fatty Acids (FISH OIL) 1000 MG CAPS Take by mouth.    [provider]    Allergies Patient has no known allergies.  Family History  Family history unknown: Yes    Social History Social History   Tobacco Use   Smoking status: Never   Smokeless tobacco: Never  Vaping Use   Vaping Use: Never used  Substance Use Topics   Alcohol use: No   Drug use: Never      Review of Systems Constitutional: No fever/chills Eyes: No visual changes. ENT: No sore throat. Cardiovascular: No chest pain Respiratory: Denies shortness of breath. Gastrointestinal: No abdominal pain.  Positive nausea, vomiting, diarrhea Genitourinary: Negative for  dysuria.  Increased urination Musculoskeletal: Negative for back pain.  Joint pain Skin: Negative for rash. Neurological: Negative for headaches, focal weakness or numbness. All other ROS negative ____________________________________________   PHYSICAL EXAM:  VITAL SIGNS: ED Triage Vitals  Enc Vitals Group     BP 02/19/21 0030 (!) 105/55     Pulse Rate 02/19/21 0030 (!) 114     Resp 02/19/21 0030 16     Temp 02/19/21 0030 99.1 F (37.3 C)     Temp Source 02/19/21 0030 Oral     SpO2  02/19/21 0030 95 %     Weight 02/19/21 0018 100 lb (45.4 kg)     Height 02/19/21 0018 5' (1.524 m)     Head Circumference --      Peak Flow --      Pain Score 02/19/21 0017 10     Pain Loc --      Pain Edu? --      Excl. in [place]? --     Constitutional: Alert and oriented. Well appearing and in no acute distress. Eyes: Conjunctivae are normal. EOMI. Head: Atraumatic. Nose: No congestion/rhinnorhea. Mouth/Throat: Mucous membranes are moist.   Neck: No stridor. Trachea Midline. FROM Cardiovascular: Tachycardic, regular rhythm. Grossly normal heart sounds.  Good peripheral circulation. Respiratory: Normal respiratory effort.  No retractions. Lungs CTAB. Gastrointestinal: Soft and nontender. No distention. No abdominal bruits.  Musculoskeletal: No lower extremity tenderness nor edema.  No joint effusions.  No obvious redness or warmth of any of the joints Neurologic:  Normal speech and language. No gross focal neurologic deficits are appreciated.  Skin:  Skin is warm, dry and intact. No rash noted. Psychiatric: Mood and affect are normal. Speech and behavior are normal. GU: Deferred   ____________________________________________   LABS (all labs ordered are listed, but only abnormal results are displayed)  Labs Reviewed  CBC WITH DIFFERENTIAL/PLATELET - Abnormal; Notable for the following components:      Result Value   RBC 3.68 (*)    Hemoglobin 9.5 (*)    HCT 29.5 (*)    MCH 25.8 (*)    RDW 19.3 (*)    Platelets 422 (*)    All other components within normal limits  COMPREHENSIVE METABOLIC PANEL - Abnormal; Notable for the following components:   Sodium 133 (*)    Glucose, Bld 121 (*)    Total Protein 8.3 (*)    Albumin 2.7 (*)    AST 289 (*)    ALT 248 (*)    All other components within normal limits  URINALYSIS, ROUTINE W REFLEX MICROSCOPIC - Abnormal; Notable for the following components:   Color, Urine YELLOW (*)    APPearance HAZY (*)    Protein, ur 30 (*)     Leukocytes,Ua MODERATE (*)    All other components within normal limits  SEDIMENTATION RATE - Abnormal; Notable for the following components:   Sed Rate 87 (*)    All other components within normal limits  TROPONIN I (HIGH SENSITIVITY) - Abnormal; Notable for the following components:   Troponin I (High Sensitivity) 35 (*)    All other components within normal limits  TROPONIN I (HIGH SENSITIVITY) - Abnormal; Notable for the following components:   Troponin I (High Sensitivity) 34 (*)    All other components within normal limits  RESP PANEL BY RT-PCR (FLU A&B, COVID) ARPGX2  LIPASE, BLOOD  C-REACTIVE PROTEIN   ____________________________________________   ED ECG REPORT I, Vanessa [place], the attending physician, personally  viewed and interpreted this ECG.  Sinus tachycardia rate of 100, no ST elevation, no T wave inversions, normal intervals ____________________________________________  RADIOLOGY Robert Bellow, personally viewed and evaluated these images (plain radiographs) as part of my medical decision making, as well as reviewing the written report by the radiologist.  ED MD interpretation: No pneumonia  Official radiology report(s): DG Chest 2 View  Result Date: 02/19/2021 CLINICAL DATA:  Cough EXAM: CHEST - 2 VIEW COMPARISON:  CT chest dated 03/10/2018 FINDINGS: Right apical pleural-parenchymal scarring. Lungs are otherwise clear. No pleural effusion or pneumothorax. The heart is normal in size. Visualized osseous structures are within normal limits. IMPRESSION: Normal chest radiographs. Electronically Signed   By: Julian Hy M.D.   On: 02/19/2021 00:54   US ABDOMEN LIMITED RUQ (LIVER/GB)  Result Date: 02/19/2021 CLINICAL DATA:  55 year old female with elevated LFTs. EXAM: ULTRASOUND ABDOMEN LIMITED RIGHT UPPER QUADRANT COMPARISON:  Noncontrast chest CT 03/10/2018. FINDINGS: Gallbladder: Partially contracted. No gallstones or wall thickening visualized. No  sonographic Murphy sign noted by sonographer. Common bile duct: Diameter: 1-2 mm, normal. Liver: Liver echogenicity at the upper limits of normal (image 37). No discrete liver lesion. No intrahepatic biliary ductal dilatation. Portal vein is patent on color Doppler imaging with normal direction of blood flow towards the liver. Other: Negative visible right kidney. IMPRESSION: Negative right upper quadrant ultrasound. Electronically Signed   By: Genevie Ann M.D.   On: 02/19/2021 06:12    ____________________________________________   PROCEDURES  Procedure(s) performed (including Critical Care):  Procedures   ____________________________________________   INITIAL IMPRESSION / ASSESSMENT AND PLAN / ED COURSE   Mahi Sliman was evaluated in Emergency Department on 02/19/2021 for the symptoms described in the history of present illness. She was evaluated in the context of the global COVID-19 pandemic, which necessitated consideration that the patient might be at risk for infection with the SARS-CoV-2 virus that causes COVID-19. Institutional protocols and algorithms that pertain to the evaluation of patients at risk for COVID-19 are in a state of rapid change based on information released by regulatory bodies including the CDC and federal and state organizations. These policies and algorithms were followed during the patient's care in the ED.    Most Likely DDx:  -Patient comes in for body aches.  Labs are ordered to evaluate for Electra abnormalities, AKI.  Given LFTs were elevated ultrasound ordered in triage but patient has no abdominal pain. COVID test was negative.  Explained to patient that we do not do testing for lupus and that she is to follow-up with her rheumatology doctor  DDx that was also considered d/t potential to cause harm, but was found less likely based on history and physical (as detailed above): -PNA (no fevers, cough but CXR to evaluate) -PNX (reassured with equal b/l breath  sounds, CXR to evaluate) -Symptomatic anemia (will get H&H) -Pulmonary embolism as no sob at rest, not pleuritic in nature, no hypoxia -Aortic Dissection as no tearing pain and no radiation to the mid back, pulses equal -Pericarditis no rub on exam, EKG changes or hx to suggest dx -Tamponade (no notable SOB, tachycardic, hypotensive) -Esophageal rupture (no h/o diffuse vomitting/no crepitus)   Trop slightly elevated but down trending stable and patient denies any chest pain UA? Possible UTI with 21-50 WBC given she does report some increased urination we will treat her for UTI hemoglobin 9.5 previous hemoglobin slightly low as well denies any rectal bleeding LFTS slightly elevated  [place] negative  Given the concern  for elevated LFTs in the setting of taking too much Tylenol I did add on a Tylenol level.  Discussed with the poison control team who recommended repeat Tylenol repeat LFTs given its now 12 hours since her initial test decide if we need to give her any NAC treatment.  I also added on a CK level which was elevated I will give her some fluids.  LFTs were slightly downtrending, Tylenol level was negative.  We discussed with poison control and pharmacy and holding off on NAC given less concern that this is from Tylenol.  Patient was instructed to stop taking the Tylenol.  Patient's heart rates are overall are better but when she moves they do get a little tachycardic.  Her CK level is slightly downtrending but is not going up.  We discussed admission versus going home.  Patient states that she is feeling a lot better and she is tolerating p.o. at bedside and she would like to go home.  Will prescribe her some antibiotics for UTI, ibuprofen for joint aches, Zofran for nausea and she can follow-up with her primary care doctor for recheck of her liver function test in 2 days and follow-up with rheumatology for further work-up for rheumatological  disorders.    ___________________________________   FINAL CLINICAL IMPRESSION(S) / ED DIAGNOSES   Final diagnoses:  Urinary tract infection without hematuria, site unspecified  Liver function abnormality  Arthralgia, unspecified joint  Nausea     MEDICATIONS GIVEN DURING THIS VISIT:  Medications  sodium chloride 0.9 % bolus 1,000 mL (0 mLs Intravenous Stopped 02/19/21 1237)  cefTRIAXone (ROCEPHIN) 1 g in sodium chloride 0.9 % 100 mL IVPB (0 g Intravenous Stopped 02/19/21 1320)     ED Discharge Orders          Ordered    ondansetron (ZOFRAN ODT) 4 MG disintegrating tablet  Every 8 hours PRN        02/19/21 1403    ibuprofen (ADVIL) 600 MG tablet  Every 6 hours PRN        02/19/21 1403    cephALEXin (KEFLEX) 500 MG capsule  2 times daily        02/19/21 1403             Note:  This document was prepared using Dragon voice recognition software and may include unintentional dictation errors.    Vanessa Bond, MD 02/19/21 [phone]

## 2021-02-19 NOTE — Discharge Instructions (Addendum)
We have started you on some antibiotics for possible UTI, Zofran to help with nausea given there were signs of dehydration need to stay very well-hydrated, ibuprofen to help with joint pain.  Please take this with food.  Please call your primary care doctor to get a follow-up appointment on Thursday or Friday for recheck of your liver function test and CK level.  Not able to take any Tylenol or acetaminophen until these are back down to normal.  Return to the ER for develop worsening symptoms or any other concerns.  You need to call the rheumatology number to get a follow-up for work-up for rheumatological diseases

## 2021-02-19 NOTE — ED Triage Notes (Signed)
Pt arrived via POV with reports of 3 months of body aches as well as diarrhea, vomiting x 2 days as well as low back pain and "vein pain"  Pt has not taken any pain medications.  Pt asking about being tested for lupus.

## 2021-02-19 NOTE — ED Notes (Signed)
Pt presents to the ED for chronic generalized pain, pt also states that she has been having some diarrhea and nausea for the past week which is not normal for her. Pt is A&Ox4 and NAD.

## 2021-02-19 NOTE — ED Provider Notes (Signed)
Emergency Medicine Provider Triage Evaluation Note  Sheila Lang , a 55 y.o. female  was evaluated in triage.  Pt complains of pain all over x60-month.  Review of Systems  Positive: Congestion, cough, body Negative: Chest pain, shortness of breath  Physical Exam  Ht 5' (1.524 m)   Wt 45.4 kg   LMP 07/16/2017   BMI 19.53 kg/m  Gen:   Awake, no distress   Resp:  Normal effort  MSK:   Moves extremities without difficulty  Other:    Medical Decision Making  Medically screening exam initiated at 12:18 AM.  Appropriate orders placed.  Sheila Lang was informed that the remainder of the evaluation will be completed by another provider, this initial triage assessment does not replace that evaluation, and the importance of remaining in the ED until their evaluation is complete.  55 year old female presenting with pain all over x3 months.  Asking about being tested for lupus because his boss thinks she has lupus.  Explained to her the ED does not routinely perform ANAs but will add sed rate and CRP.  Patient will be seen by provider when she is provided a treatment room.   Irean Hong, MD 02/19/21 [phone]

## 2021-02-20 LAB — URINE CULTURE: Culture: <10000 — AB

## 2021-02-21 ENCOUNTER — Emergency Department: Admission: EM | Admit: 2021-02-21 | Discharge: 2021-02-21 | Discharge status: Absent without leave | Payer: 59

## 2021-03-05 ENCOUNTER — Emergency Department: Payer: 59

## 2021-03-05 ENCOUNTER — Emergency Department
Admission: EM | Admit: 2021-03-05 | Discharge: 2021-03-05 | Disposition: A | Discharge status: Home / Self Care | Payer: 59 | Attending: Emergency Medicine | Admitting: Emergency Medicine

## 2021-03-05 ENCOUNTER — Other Ambulatory Visit: Payer: Self-pay

## 2021-03-05 DIAGNOSIS — Y92510 Bank as the place of occurrence of the external cause: Secondary | ICD-10-CM | POA: Diagnosis not present

## 2021-03-05 DIAGNOSIS — R519 Headache, unspecified: Secondary | ICD-10-CM | POA: Diagnosis not present

## 2021-03-05 DIAGNOSIS — S0083XA Contusion of other part of head, initial encounter: Secondary | ICD-10-CM

## 2021-03-05 DIAGNOSIS — W010XXA Fall on same level from slipping, tripping and stumbling without subsequent striking against object, initial encounter: Secondary | ICD-10-CM | POA: Diagnosis not present

## 2021-03-05 DIAGNOSIS — S01511A Laceration without foreign body of lip, initial encounter: Secondary | ICD-10-CM | POA: Diagnosis present

## 2021-03-05 DIAGNOSIS — S79912A Unspecified injury of left hip, initial encounter: Secondary | ICD-10-CM | POA: Insufficient documentation

## 2021-03-05 NOTE — Discharge Instructions (Addendum)
Please follow up with primary care for symptoms of concern.  Return to the ER for symptoms that change or worsen.

## 2021-03-05 NOTE — ED Notes (Signed)
Pt called her friend and they will pick pt up from lobby , pt given warm blankets.
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
## 2021-04-05 DIAGNOSIS — M0609 Rheumatoid arthritis without rheumatoid factor, multiple sites: Secondary | ICD-10-CM | POA: Diagnosis present

## 2021-04-05 DIAGNOSIS — Z7960 Long term (current) use of unspecified immunomodulators and immunosuppressants: Secondary | ICD-10-CM | POA: Insufficient documentation

## 2021-04-05 DIAGNOSIS — I7300 Raynaud's syndrome without gangrene: Secondary | ICD-10-CM | POA: Insufficient documentation

## 2021-09-26 ENCOUNTER — Other Ambulatory Visit: Payer: Self-pay | Admitting: Rheumatology

## 2021-09-26 DIAGNOSIS — M329 Systemic lupus erythematosus, unspecified: Secondary | ICD-10-CM

## 2021-09-26 DIAGNOSIS — J849 Interstitial pulmonary disease, unspecified: Secondary | ICD-10-CM
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
## 2022-10-25 ENCOUNTER — Emergency Department: Payer: PRIVATE HEALTH INSURANCE

## 2022-10-25 ENCOUNTER — Other Ambulatory Visit: Payer: Self-pay

## 2022-10-25 ENCOUNTER — Emergency Department
Admission: EM | Admit: 2022-10-25 | Discharge: 2022-10-25 | Disposition: A | Discharge status: Home / Self Care | Payer: PRIVATE HEALTH INSURANCE | Attending: Student in an Organized Health Care Education/Training Program | Admitting: Student in an Organized Health Care Education/Training Program

## 2022-10-25 DIAGNOSIS — Y92410 Unspecified street and highway as the place of occurrence of the external cause: Secondary | ICD-10-CM | POA: Insufficient documentation

## 2022-10-25 DIAGNOSIS — S22009A Unspecified fracture of unspecified thoracic vertebra, initial encounter for closed fracture: Secondary | ICD-10-CM | POA: Diagnosis not present

## 2022-10-25 DIAGNOSIS — S29002A Unspecified injury of muscle and tendon of back wall of thorax, initial encounter: Secondary | ICD-10-CM | POA: Diagnosis present

## 2022-10-25 DIAGNOSIS — S22000A Wedge compression fracture of unspecified thoracic vertebra, initial encounter for closed fracture: Secondary | ICD-10-CM

## 2022-10-25 DIAGNOSIS — S2222XA Fracture of body of sternum, initial encounter for closed fracture: Secondary | ICD-10-CM | POA: Diagnosis not present

## 2022-10-25 DIAGNOSIS — S3991XA Unspecified injury of abdomen, initial encounter: Secondary | ICD-10-CM | POA: Insufficient documentation

## 2022-10-25 DIAGNOSIS — S0990XA Unspecified injury of head, initial encounter: Secondary | ICD-10-CM | POA: Insufficient documentation

## 2022-10-25 LAB — CBC
HCT: 36.4 % (ref 36.0–46.0)
Hemoglobin: 11.1 g/dL — ABNORMAL LOW (ref 12.0–15.0)
MCH: 26.9 pg (ref 26.0–34.0)
MCHC: 30.5 g/dL (ref 30.0–36.0)
MCV: 88.3 fL (ref 80.0–100.0)
Platelets: 344 10*3/uL (ref 150–400)
RBC: 4.12 MIL/uL (ref 3.87–5.11)
RDW: 16.7 % — ABNORMAL HIGH (ref 11.5–15.5)
WBC: 6.2 10*3/uL (ref 4.0–10.5)
nRBC: 0 % (ref 0.0–0.2)

## 2022-10-25 LAB — BASIC METABOLIC PANEL
Anion gap: 10 (ref 5–15)
BUN: 16 mg/dL (ref 6–20)
CO2: 22 mmol/L (ref 22–32)
Calcium: 8.6 mg/dL — ABNORMAL LOW (ref 8.9–10.3)
Chloride: 102 mmol/L (ref 98–111)
Creatinine, Ser: 0.73 mg/dL (ref 0.44–1.00)
GFR, Estimated: >60 mL/min (ref >60)
Glucose, Bld: 130 mg/dL — ABNORMAL HIGH (ref 70–99)
Potassium: 3.5 mmol/L (ref 3.5–5.1)
Sodium: 134 mmol/L — ABNORMAL LOW (ref 135–145)

## 2022-10-25 LAB — TROPONIN I (HIGH SENSITIVITY): Troponin I (High Sensitivity): 5 ng/L (ref <18)

## 2022-10-25 MED ORDER — OXYCODONE-ACETAMINOPHEN 5-325 MG PO TABS: 1.0000 | ORAL_TABLET | Freq: Three times a day (TID) | ORAL | 0 refills | Status: AC | PRN

## 2022-10-25 MED ORDER — MORPHINE SULFATE (PF) 4 MG/ML IV SOLN
4.0000 mg | Freq: Once | INTRAVENOUS | Status: AC
  Administered 2022-10-25: 4 mg via INTRAVENOUS
  Filled 2022-10-25: qty 1

## 2022-10-25 MED ORDER — IOHEXOL 300 MG/ML  SOLN
100.0000 mL | Freq: Once | INTRAMUSCULAR | Status: AC | PRN
  Administered 2022-10-25: 100 mL via INTRAVENOUS

## 2022-10-25 MED ORDER — ONDANSETRON HCL 4 MG/2ML IJ SOLN
4.0000 mg | Freq: Once | INTRAMUSCULAR | Status: AC
  Administered 2022-10-25: 4 mg via INTRAVENOUS
  Filled 2022-10-25: qty 2

## 2022-10-25 MED ORDER — CYCLOBENZAPRINE HCL 5 MG PO TABS: 5.0000 mg | ORAL_TABLET | Freq: Three times a day (TID) | ORAL | 0 refills | Status: DC | PRN

## 2022-10-25 MED ORDER — LIDOCAINE 5 % EX PTCH: 1.0000 | MEDICATED_PATCH | Freq: Two times a day (BID) | CUTANEOUS | 0 refills | Status: AC | PRN
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
## 2022-10-25 NOTE — Discharge Instructions (Addendum)
Your exam is overall reassuring.  Your CT scans were all negative with exception of your chest CT which did confirm a mildly depressed orbital fracture, as well as minimally depressed fractures of your T4, T8-T10 vertebrae.  The stable fractures do not require any surgical intervention.  They will heal without difficulty.  You can experience stiffness and pain in the next few weeks.  Follow-up with neurosurgery for reevaluation.
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
## 2023-04-05 ENCOUNTER — Other Ambulatory Visit: Payer: Self-pay

## 2023-04-05 ENCOUNTER — Emergency Department (HOSPITAL_COMMUNITY)
Admission: EM | Admit: 2023-04-05 | Discharge: 2023-04-05 | Disposition: A | Discharge status: Home / Self Care | Payer: PRIVATE HEALTH INSURANCE | Attending: Emergency Medicine | Admitting: Emergency Medicine

## 2023-04-05 DIAGNOSIS — R300 Dysuria: Secondary | ICD-10-CM | POA: Insufficient documentation

## 2023-04-05 DIAGNOSIS — M79651 Pain in right thigh: Secondary | ICD-10-CM | POA: Diagnosis present

## 2023-04-05 DIAGNOSIS — M79604 Pain in right leg: Secondary | ICD-10-CM

## 2023-04-05 DIAGNOSIS — M79652 Pain in left thigh: Secondary | ICD-10-CM | POA: Diagnosis not present

## 2023-04-05 LAB — URINALYSIS, ROUTINE W REFLEX MICROSCOPIC
Bacteria, UA: NONE SEEN
Bilirubin Urine: NEGATIVE
Glucose, UA: NEGATIVE mg/dL
Hgb urine dipstick: NEGATIVE
Ketones, ur: NEGATIVE mg/dL
Leukocytes,Ua: NEGATIVE
Nitrite: NEGATIVE
Protein, ur: NEGATIVE mg/dL
Specific Gravity, Urine: 1.016 (ref 1.005–1.030)
pH: 6 (ref 5.0–8.0)

## 2023-04-05 MED ORDER — LIDOCAINE 5 % EX PTCH: 1.0000 | MEDICATED_PATCH | CUTANEOUS | 0 refills | Status: DC

## 2023-04-05 MED ORDER — MELOXICAM 15 MG PO TABS: 15.0000 mg | ORAL_TABLET | Freq: Every day | ORAL | 0 refills | Status: DC

## 2023-04-05 MED ORDER — KETOROLAC TROMETHAMINE 60 MG/2ML IM SOLN
60.0000 mg | Freq: Once | INTRAMUSCULAR | Status: AC
  Administered 2023-04-05: 60 mg via INTRAMUSCULAR
  Filled 2023-04-05: qty 2

## 2023-04-05 MED ORDER — LIDOCAINE 5 % EX PTCH
2.0000 | MEDICATED_PATCH | CUTANEOUS | Status: DC
  Administered 2023-04-05: 2 via TRANSDERMAL
  Filled 2023-04-05: qty 2

## 2023-04-05 NOTE — ED Triage Notes (Signed)
Patient reports BLE pain x 2 weeks L > R. Denies injury. Taking OTC meds without relief. Also reports decreased urination and burning with same x 2 weeks.
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
## 2023-04-16 ENCOUNTER — Other Ambulatory Visit: Payer: Self-pay

## 2023-04-16 ENCOUNTER — Encounter: Payer: Self-pay | Admitting: *Deleted

## 2023-04-16 DIAGNOSIS — R531 Weakness: Secondary | ICD-10-CM | POA: Diagnosis present

## 2023-04-16 DIAGNOSIS — N3090 Cystitis, unspecified without hematuria: Secondary | ICD-10-CM | POA: Diagnosis not present

## 2023-04-16 DIAGNOSIS — M069 Rheumatoid arthritis, unspecified: Secondary | ICD-10-CM | POA: Diagnosis not present

## 2023-04-16 DIAGNOSIS — Z20822 Contact with and (suspected) exposure to covid-19: Secondary | ICD-10-CM | POA: Insufficient documentation
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
## 2023-04-17 ENCOUNTER — Emergency Department
Admission: EM | Admit: 2023-04-17 | Discharge: 2023-04-17 | Disposition: A | Discharge status: Home / Self Care | Payer: BLUE CROSS/BLUE SHIELD | Attending: Emergency Medicine | Admitting: Emergency Medicine

## 2023-04-17 ENCOUNTER — Emergency Department: Payer: BLUE CROSS/BLUE SHIELD

## 2023-04-17 DIAGNOSIS — N3090 Cystitis, unspecified without hematuria: Secondary | ICD-10-CM

## 2023-04-17 DIAGNOSIS — M069 Rheumatoid arthritis, unspecified: Secondary | ICD-10-CM

## 2023-04-17 LAB — HEPATIC FUNCTION PANEL
ALT: 46 U/L — ABNORMAL HIGH (ref 0–44)
AST: 83 U/L — ABNORMAL HIGH (ref 15–41)
Albumin: 2 g/dL — ABNORMAL LOW (ref 3.5–5.0)
Alkaline Phosphatase: 66 U/L (ref 38–126)
Bilirubin, Direct: 0.3 mg/dL — ABNORMAL HIGH (ref 0.0–0.2)
Indirect Bilirubin: 0.8 mg/dL (ref 0.3–0.9)
Total Bilirubin: 1.1 mg/dL (ref <1.2)
Total Protein: 8 g/dL (ref 6.5–8.1)

## 2023-04-17 LAB — CBC
HCT: 33.4 % — ABNORMAL LOW (ref 36.0–46.0)
Hemoglobin: 10.1 g/dL — ABNORMAL LOW (ref 12.0–15.0)
MCH: 26.8 pg (ref 26.0–34.0)
MCHC: 30.2 g/dL (ref 30.0–36.0)
MCV: 88.6 fL (ref 80.0–100.0)
Platelets: 312 10*3/uL (ref 150–400)
RBC: 3.77 MIL/uL — ABNORMAL LOW (ref 3.87–5.11)
RDW: 19.2 % — ABNORMAL HIGH (ref 11.5–15.5)
WBC: 6.1 10*3/uL (ref 4.0–10.5)
nRBC: 0 % (ref 0.0–0.2)

## 2023-04-17 LAB — BASIC METABOLIC PANEL
Anion gap: 11 (ref 5–15)
BUN: 18 mg/dL (ref 6–20)
CO2: 24 mmol/L (ref 22–32)
Calcium: 9.1 mg/dL (ref 8.9–10.3)
Chloride: 98 mmol/L (ref 98–111)
Creatinine, Ser: 0.56 mg/dL (ref 0.44–1.00)
GFR, Estimated: >60 mL/min (ref >60)
Glucose, Bld: 88 mg/dL (ref 70–99)
Potassium: 4.1 mmol/L (ref 3.5–5.1)
Sodium: 133 mmol/L — ABNORMAL LOW (ref 135–145)

## 2023-04-17 LAB — URINALYSIS, W/ REFLEX TO CULTURE (INFECTION SUSPECTED)
Bacteria, UA: NONE SEEN
Bilirubin Urine: NEGATIVE
Glucose, UA: NEGATIVE mg/dL
Hgb urine dipstick: NEGATIVE
Ketones, ur: 5 mg/dL — AB
Nitrite: NEGATIVE
Protein, ur: NEGATIVE mg/dL
Specific Gravity, Urine: 1.019 (ref 1.005–1.030)
pH: 5 (ref 5.0–8.0)

## 2023-04-17 LAB — MAGNESIUM: Magnesium: 1.8 mg/dL (ref 1.7–2.4)

## 2023-04-17 LAB — RESP PANEL BY RT-PCR (RSV, FLU A&B, COVID)  RVPGX2
Influenza A by PCR: NEGATIVE
Influenza B by PCR: NEGATIVE
Resp Syncytial Virus by PCR: NEGATIVE
SARS Coronavirus 2 by RT PCR: NEGATIVE

## 2023-04-17 MED ORDER — CEPHALEXIN 500 MG PO CAPS: 500.0000 mg | ORAL_CAPSULE | Freq: Two times a day (BID) | ORAL | 0 refills | Status: DC

## 2023-04-17 NOTE — ED Provider Notes (Signed)
[facility] Provider Note    Event Date/Time   First MD Initiated Contact with Patient 04/17/23 [phone]     (approximate)   History   Chief Complaint: Leg Pain   HPI  Sheila Lang is a 57 y.o. female with a history of rheumatoid arthritis and lupus who is brought to the ED today due to generalized weakness.  She complains of pain in bilateral hands, bilateral shoulders, bilateral hips.  States that she is having increased trouble with movement over the last few days.  Reviewed outside records including rheumatology note from office visit on 01/23/2023 where patient was prescribed prednisone and Plaquenil, noted to be noncompliant with therapy in the past.  She was due to return to rheumatology clinic at the beginning of December but has not been back yet.          Physical Exam   Triage Vital Signs: ED Triage Vitals  Encounter Vitals Group     BP 04/16/23 2141 (!) 107/56     Systolic BP Percentile --      Diastolic BP Percentile --      Pulse Rate 04/16/23 2141 (!) 116     Resp 04/16/23 2141 20     Temp 04/16/23 2141 98.1 F (36.7 C)     Temp Source 04/16/23 2141 Oral     SpO2 04/16/23 2141 100 %     Weight 04/16/23 2139 75 lb (34 kg)     Height 04/16/23 2139 4\' 8"  (1.422 m)     Head Circumference --      Peak Flow --      Pain Score 04/16/23 2138 8     Pain Loc --      Pain Education --      Exclude from Growth Chart --     Most recent vital signs: Vitals:   04/17/23 0854 04/17/23 0858  BP: (!) 114/50 (!) 114/50  Pulse:  78  Resp:  16  Temp:  97.9 F (36.6 C)  SpO2:  94%    General: Awake, no distress.  CV:  Good peripheral perfusion.  Regular rate rhythm.  Normal distal pulses Resp:  Normal effort.  Clear to auscultation bilaterally Abd:  No distention.  Soft nontender Other:  Somewhat dry oral mucosa.  Bilateral TMs and external canals are normal   ED Results / Procedures / Treatments   Labs (all labs ordered are  listed, but only abnormal results are displayed) Labs Reviewed  BASIC METABOLIC PANEL - Abnormal; Notable for the following components:      Result Value   Sodium 133 (*)    All other components within normal limits  CBC - Abnormal; Notable for the following components:   RBC 3.77 (*)    Hemoglobin 10.1 (*)    HCT 33.4 (*)    RDW 19.2 (*)    All other components within normal limits  URINALYSIS, W/ REFLEX TO CULTURE (INFECTION SUSPECTED) - Abnormal; Notable for the following components:   Color, Urine YELLOW (*)    APPearance HAZY (*)    Ketones, ur 5 (*)    Leukocytes,Ua MODERATE (*)    All other components within normal limits  HEPATIC FUNCTION PANEL - Abnormal; Notable for the following components:   Albumin 2.0 (*)    AST 83 (*)    ALT 46 (*)    Bilirubin, Direct 0.3 (*)    All other components within normal limits  RESP PANEL BY RT-PCR (RSV, FLU A&B,  COVID)  RVPGX2  URINE CULTURE  MAGNESIUM     EKG    RADIOLOGY Ultrasound lower extremity bilateral interpreted by me, negative for DVT.  Radiology report reviewed   PROCEDURES:  Procedures   MEDICATIONS ORDERED IN ED: Medications - No data to display   IMPRESSION / MDM / ASSESSMENT AND PLAN / ED COURSE  I reviewed the triage vital signs and the nursing notes.  DDx: Electrolyte abnormality, AKI, anemia, electrolyte abnormality, medication noncompliance, rheumatoid arthritis  Patient's presentation is most consistent with acute presentation with potential threat to life or bodily function.  Patient presents with pain in multiple joints consistent with RA.  Vital signs were unremarkable.  Neurologically intact.  Labs okay.   Clinical Course as of 04/17/23 1224  Fri Apr 17, 2023  1222 UA suggestive of UTI.  Will start on antibiotics.  She request discharge home and is stable for outpatient follow-up. [PS]    Clinical Course User Index [PS] Sharman Cheek, MD     FINAL CLINICAL IMPRESSION(S) / ED  DIAGNOSES   Final diagnoses:  Cystitis  Rheumatoid arthritis involving multiple sites, unspecified whether rheumatoid factor present (HCC)     Rx / DC Orders   ED Discharge Orders          Ordered    cephALEXin (KEFLEX) 500 MG capsule  2 times daily        04/17/23 1224             Note:  This document was prepared using Dragon voice recognition software and may include unintentional dictation errors.   Sharman Cheek, MD 04/17/23 1225

## 2023-04-17 NOTE — ED Notes (Addendum)
Pt called out for warm blankets and then Pt stated "After I get warmed up i want to sign out AMA" This tech told RN
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
## 2023-04-18 LAB — URINE CULTURE: Culture: NO GROWTH

## 2023-05-03 ENCOUNTER — Emergency Department
Admission: EM | Admit: 2023-05-03 | Discharge: 2023-05-03 | Disposition: A | Discharge status: Home / Self Care | Payer: Self-pay | Attending: Emergency Medicine | Admitting: Emergency Medicine

## 2023-05-03 DIAGNOSIS — S31000D Unspecified open wound of lower back and pelvis without penetration into retroperitoneum, subsequent encounter: Secondary | ICD-10-CM | POA: Diagnosis present

## 2023-05-03 DIAGNOSIS — R531 Weakness: Secondary | ICD-10-CM | POA: Insufficient documentation

## 2023-05-03 DIAGNOSIS — X58XXXA Exposure to other specified factors, initial encounter: Secondary | ICD-10-CM | POA: Diagnosis not present

## 2023-05-03 DIAGNOSIS — S31000A Unspecified open wound of lower back and pelvis without penetration into retroperitoneum, initial encounter: Secondary | ICD-10-CM

## 2023-05-03 HISTORY — DX: Unspecified osteoarthritis, unspecified site: M19.90

## 2023-05-03 HISTORY — DX: Systemic lupus erythematosus, unspecified: M32.9

## 2023-05-03 LAB — URINALYSIS, ROUTINE W REFLEX MICROSCOPIC
Bilirubin Urine: NEGATIVE
Glucose, UA: NEGATIVE mg/dL
Hgb urine dipstick: NEGATIVE
Ketones, ur: 20 mg/dL — AB
Nitrite: NEGATIVE
Protein, ur: 30 mg/dL — AB
Specific Gravity, Urine: 1.021 (ref 1.005–1.030)
pH: 5 (ref 5.0–8.0)

## 2023-05-03 LAB — CBC WITH DIFFERENTIAL/PLATELET
Abs Immature Granulocytes: 0.05 10*3/uL (ref 0.00–0.07)
Basophils Absolute: 0 10*3/uL (ref 0.0–0.1)
Basophils Relative: 0 %
Eosinophils Absolute: 0.1 10*3/uL (ref 0.0–0.5)
Eosinophils Relative: 1 %
HCT: 31.2 % — ABNORMAL LOW (ref 36.0–46.0)
Hemoglobin: 9.6 g/dL — ABNORMAL LOW (ref 12.0–15.0)
Immature Granulocytes: 1 %
Lymphocytes Relative: 21 %
Lymphs Abs: 1.2 10*3/uL (ref 0.7–4.0)
MCH: 26.8 pg (ref 26.0–34.0)
MCHC: 30.8 g/dL (ref 30.0–36.0)
MCV: 87.2 fL (ref 80.0–100.0)
Monocytes Absolute: 0.4 10*3/uL (ref 0.1–1.0)
Monocytes Relative: 6 %
Neutro Abs: 3.8 10*3/uL (ref 1.7–7.7)
Neutrophils Relative %: 71 %
Platelets: 277 10*3/uL (ref 150–400)
RBC: 3.58 MIL/uL — ABNORMAL LOW (ref 3.87–5.11)
RDW: 19.9 % — ABNORMAL HIGH (ref 11.5–15.5)
WBC: 5.5 10*3/uL (ref 4.0–10.5)
nRBC: 0 % (ref 0.0–0.2)

## 2023-05-03 LAB — BASIC METABOLIC PANEL
Anion gap: 9 (ref 5–15)
BUN: 15 mg/dL (ref 6–20)
CO2: 20 mmol/L — ABNORMAL LOW (ref 22–32)
Calcium: 8.4 mg/dL — ABNORMAL LOW (ref 8.9–10.3)
Chloride: 105 mmol/L (ref 98–111)
Creatinine, Ser: 0.55 mg/dL (ref 0.44–1.00)
GFR, Estimated: >60 mL/min (ref >60)
Glucose, Bld: 56 mg/dL — ABNORMAL LOW (ref 70–99)
Potassium: 3.8 mmol/L (ref 3.5–5.1)
Sodium: 134 mmol/L — ABNORMAL LOW (ref 135–145)

## 2023-05-03 MED ORDER — FOSFOMYCIN TROMETHAMINE 3 G PO PACK
3.0000 g | PACK | Freq: Once | ORAL | Status: AC
  Administered 2023-05-03: 3 g via ORAL
  Filled 2023-05-03: qty 3

## 2023-05-03 NOTE — ED Notes (Signed)
 Lab called to stick Pt.

## 2023-05-03 NOTE — ED Triage Notes (Signed)
 Pt comes via EMs from home with c/o weakness. Pt has hx of lupus. Pt has place on back that might be infected. Pt has been using lidocaine patches. Pt has had sign weight loss.   DSS with pt also.

## 2023-05-03 NOTE — Discharge Instructions (Signed)
 Please seek medical attention for any high fevers, chest pain, shortness of breath, change in behavior, persistent vomiting, bloody stool or any other new or concerning symptoms.

## 2023-05-03 NOTE — ED Triage Notes (Signed)
 Pt c/o generalized weakness and BLE pain x2 months.  Pain score 10/10.  Pt has not taken anything for pain.  Also, Pt would like wound on buttocks checked.  Hx of lupus.  Pt reports she has not been eating well d/t lack of appetite.     Pt reports she lives alone and would like to speak to someone about home health.

## 2023-05-03 NOTE — ED Provider Notes (Signed)
 [facility] Provider Note    Event Date/Time   First MD Initiated Contact with Patient 05/03/23 1634     (approximate)   History   Weakness and Wound Check   HPI  Jaleigh Wernli is a 58 y.o. female who presents to the emergency department today because of concerns for a wound to her sacral region.  She states that it first came up about a week ago.  She denies any fevers since then.  She does still complain of weakness.  Per chart review the patient was seen a little over 2 weeks ago for weakness and was found to have a UTI.  Patient states she did not take the antibiotics that she was prescribed and is concerned she still has a urinary tract infection.     Physical Exam   Triage Vital Signs: ED Triage Vitals [05/03/23 1235]  Encounter Vitals Group     BP (!) 119/59     Systolic BP Percentile      Diastolic BP Percentile      Pulse Rate 87     Resp 16     Temp 98.4 F (36.9 C)     Temp Source Oral     SpO2 100 %     Weight 75 lb (34 kg)     Height 4' 8 (1.422 m)     Head Circumference      Peak Flow      Pain Score 10     Pain Loc      Pain Education      Exclude from Growth Chart     Most recent vital signs: Vitals:   05/03/23 1235  BP: (!) 119/59  Pulse: 87  Resp: 16  Temp: 98.4 F (36.9 C)  SpO2: 100%   General: Awake, alert, oriented. CV:  Good peripheral perfusion. Regular rate and rhythm. Resp:  Normal effort. Lungs clear. Abd:  No distention.  Other:  Small wound to sacral region rougly 1 cm in diameter. No surrounding erythema, no discharge, no odor.    ED Results / Procedures / Treatments   Labs (all labs ordered are listed, but only abnormal results are displayed) Labs Reviewed  CBC WITH DIFFERENTIAL/PLATELET - Abnormal; Notable for the following components:      Result Value   RBC 3.58 (*)    Hemoglobin 9.6 (*)    HCT 31.2 (*)    RDW 19.9 (*)    All other components within normal limits  BASIC METABOLIC  PANEL - Abnormal; Notable for the following components:   Sodium 134 (*)    CO2 20 (*)    Glucose, Bld 56 (*)    Calcium  8.4 (*)    All other components within normal limits  URINALYSIS, ROUTINE W REFLEX MICROSCOPIC - Abnormal; Notable for the following components:   Color, Urine AMBER (*)    APPearance HAZY (*)    Ketones, ur 20 (*)    Protein, ur 30 (*)    Leukocytes,Ua TRACE (*)    Bacteria, UA RARE (*)    All other components within normal limits     EKG  None   RADIOLOGY None   PROCEDURES:  Critical Care performed: No   MEDICATIONS ORDERED IN ED: Medications - No data to display   IMPRESSION / MDM / ASSESSMENT AND PLAN / ED COURSE  I reviewed the triage vital signs and the nursing notes.  Differential diagnosis includes, but is not limited to, pressure ulcer, wound infection, anemia, electrolyte abnormality, UTI  Patient's presentation is most consistent with acute presentation with potential threat to life or bodily function.   Patient presented to the emergency department today with primary concern for wound to her sacral region.  On exam she does have a small pressure ulcer there however no evidence of infection.  Of note she continues to be weak and did not fill her antibiotic prescription from last ER visit.  Will check a UA today.  Blood work without concerning leukocytosis.  Does show slight hypoglycemia.  UA shows possible infection.  Will give dose of fosfomycin here given that patient did not fill the prescription last time.  Patient does states she would like to be discharged home.  Did discuss with patient concern for possible safety issues going home.  She requested home health to be ordered.  I did order home health PT OT nursing aide.  In terms of the sacral wound did have nursing staff dress it.     FINAL CLINICAL IMPRESSION(S) / ED DIAGNOSES   Final diagnoses:  Weakness  Wound of sacral region, initial encounter     Note:  This document was prepared using Dragon voice recognition software and may include unintentional dictation errors.    Floy Roberts, MD 05/03/23 2039

## 2023-05-03 NOTE — ED Notes (Signed)
 Pt asking for help to switch sides while lying in bed. Pt moved to right side, pillow placed behind pt. Pt asking for warm blanket over face, warm blanket provided.

## 2023-05-12 ENCOUNTER — Inpatient Hospital Stay
Admission: EM | Admit: 2023-05-12 | Discharge: 2023-05-25 | DRG: 922 | Disposition: A | Discharge status: Home Health | Payer: BLUE CROSS/BLUE SHIELD | Attending: Student | Admitting: Student

## 2023-05-12 ENCOUNTER — Encounter: Payer: Self-pay | Admitting: Intensive Care

## 2023-05-12 ENCOUNTER — Emergency Department: Payer: BLUE CROSS/BLUE SHIELD

## 2023-05-12 ENCOUNTER — Other Ambulatory Visit: Payer: Self-pay

## 2023-05-12 DIAGNOSIS — R64 Cachexia: Secondary | ICD-10-CM | POA: Diagnosis present

## 2023-05-12 DIAGNOSIS — Z791 Long term (current) use of non-steroidal anti-inflammatories (NSAID): Secondary | ICD-10-CM

## 2023-05-12 DIAGNOSIS — I10 Essential (primary) hypertension: Secondary | ICD-10-CM | POA: Diagnosis present

## 2023-05-12 DIAGNOSIS — E8809 Other disorders of plasma-protein metabolism, not elsewhere classified: Secondary | ICD-10-CM | POA: Diagnosis present

## 2023-05-12 DIAGNOSIS — M329 Systemic lupus erythematosus, unspecified: Secondary | ICD-10-CM | POA: Diagnosis present

## 2023-05-12 DIAGNOSIS — E860 Dehydration: Secondary | ICD-10-CM | POA: Diagnosis present

## 2023-05-12 DIAGNOSIS — E43 Unspecified severe protein-calorie malnutrition: Secondary | ICD-10-CM | POA: Diagnosis present

## 2023-05-12 DIAGNOSIS — Z1152 Encounter for screening for COVID-19: Secondary | ICD-10-CM | POA: Diagnosis not present

## 2023-05-12 DIAGNOSIS — Z681 Body mass index (BMI) 19 or less, adult: Secondary | ICD-10-CM | POA: Diagnosis not present

## 2023-05-12 DIAGNOSIS — M898X9 Other specified disorders of bone, unspecified site: Secondary | ICD-10-CM

## 2023-05-12 DIAGNOSIS — B974 Respiratory syncytial virus as the cause of diseases classified elsewhere: Secondary | ICD-10-CM | POA: Diagnosis present

## 2023-05-12 DIAGNOSIS — L89153 Pressure ulcer of sacral region, stage 3: Secondary | ICD-10-CM | POA: Diagnosis present

## 2023-05-12 DIAGNOSIS — M069 Rheumatoid arthritis, unspecified: Secondary | ICD-10-CM | POA: Diagnosis present

## 2023-05-12 DIAGNOSIS — Z87898 Personal history of other specified conditions: Secondary | ICD-10-CM | POA: Diagnosis not present

## 2023-05-12 DIAGNOSIS — Z5982 Transportation insecurity: Secondary | ICD-10-CM

## 2023-05-12 DIAGNOSIS — R9431 Abnormal electrocardiogram [ECG] [EKG]: Secondary | ICD-10-CM | POA: Diagnosis not present

## 2023-05-12 DIAGNOSIS — K5900 Constipation, unspecified: Secondary | ICD-10-CM | POA: Diagnosis present

## 2023-05-12 DIAGNOSIS — Z5941 Food insecurity: Secondary | ICD-10-CM

## 2023-05-12 DIAGNOSIS — B338 Other specified viral diseases: Secondary | ICD-10-CM | POA: Diagnosis not present

## 2023-05-12 DIAGNOSIS — R52 Pain, unspecified: Secondary | ICD-10-CM

## 2023-05-12 DIAGNOSIS — L98421 Non-pressure chronic ulcer of back limited to breakdown of skin: Secondary | ICD-10-CM

## 2023-05-12 DIAGNOSIS — T7611XA Adult physical abuse, suspected, initial encounter: Principal | ICD-10-CM

## 2023-05-12 DIAGNOSIS — E039 Hypothyroidism, unspecified: Secondary | ICD-10-CM | POA: Diagnosis present

## 2023-05-12 DIAGNOSIS — L8990 Pressure ulcer of unspecified site, unspecified stage: Secondary | ICD-10-CM | POA: Insufficient documentation

## 2023-05-12 DIAGNOSIS — R112 Nausea with vomiting, unspecified: Principal | ICD-10-CM

## 2023-05-12 DIAGNOSIS — E7800 Pure hypercholesterolemia, unspecified: Secondary | ICD-10-CM | POA: Diagnosis present

## 2023-05-12 DIAGNOSIS — Z560 Unemployment, unspecified: Secondary | ICD-10-CM

## 2023-05-12 DIAGNOSIS — Z87440 Personal history of urinary (tract) infections: Secondary | ICD-10-CM | POA: Diagnosis not present

## 2023-05-12 DIAGNOSIS — D649 Anemia, unspecified: Secondary | ICD-10-CM

## 2023-05-12 DIAGNOSIS — F32A Depression, unspecified: Secondary | ICD-10-CM

## 2023-05-12 LAB — COMPREHENSIVE METABOLIC PANEL
ALT: 58 U/L — ABNORMAL HIGH (ref 0–44)
AST: 70 U/L — ABNORMAL HIGH (ref 15–41)
Albumin: 1.6 g/dL — ABNORMAL LOW (ref 3.5–5.0)
Alkaline Phosphatase: 79 U/L (ref 38–126)
Anion gap: 10 (ref 5–15)
BUN: 10 mg/dL (ref 6–20)
CO2: 22 mmol/L (ref 22–32)
Calcium: 7.6 mg/dL — ABNORMAL LOW (ref 8.9–10.3)
Chloride: 99 mmol/L (ref 98–111)
Creatinine, Ser: 0.49 mg/dL (ref 0.44–1.00)
GFR, Estimated: >60 mL/min (ref >60)
Glucose, Bld: 63 mg/dL — ABNORMAL LOW (ref 70–99)
Potassium: 3.6 mmol/L (ref 3.5–5.1)
Sodium: 131 mmol/L — ABNORMAL LOW (ref 135–145)
Total Bilirubin: 1 mg/dL (ref 0.0–1.2)
Total Protein: 7.6 g/dL (ref 6.5–8.1)

## 2023-05-12 LAB — CBC WITH DIFFERENTIAL/PLATELET
Abs Immature Granulocytes: 0.06 10*3/uL (ref 0.00–0.07)
Basophils Absolute: 0 10*3/uL (ref 0.0–0.1)
Basophils Relative: 0 %
Eosinophils Absolute: 0 10*3/uL (ref 0.0–0.5)
Eosinophils Relative: 1 %
HCT: 28.6 % — ABNORMAL LOW (ref 36.0–46.0)
Hemoglobin: 9.2 g/dL — ABNORMAL LOW (ref 12.0–15.0)
Immature Granulocytes: 1 %
Lymphocytes Relative: 23 %
Lymphs Abs: 1.4 10*3/uL (ref 0.7–4.0)
MCH: 26.7 pg (ref 26.0–34.0)
MCHC: 32.2 g/dL (ref 30.0–36.0)
MCV: 83.1 fL (ref 80.0–100.0)
Monocytes Absolute: 0.4 10*3/uL (ref 0.1–1.0)
Monocytes Relative: 7 %
Neutro Abs: 4.4 10*3/uL (ref 1.7–7.7)
Neutrophils Relative %: 68 %
Platelets: 389 10*3/uL (ref 150–400)
RBC: 3.44 MIL/uL — ABNORMAL LOW (ref 3.87–5.11)
RDW: 21.2 % — ABNORMAL HIGH (ref 11.5–15.5)
Smear Review: NORMAL
WBC: 6.3 10*3/uL (ref 4.0–10.5)
nRBC: 0 % (ref 0.0–0.2)

## 2023-05-12 LAB — RESP PANEL BY RT-PCR (RSV, FLU A&B, COVID)  RVPGX2
Influenza A by PCR: NEGATIVE
Influenza B by PCR: NEGATIVE
Resp Syncytial Virus by PCR: POSITIVE — AB
SARS Coronavirus 2 by RT PCR: NEGATIVE

## 2023-05-12 LAB — LACTIC ACID, PLASMA: Lactic Acid, Venous: 1.8 mmol/L (ref 0.5–1.9)

## 2023-05-12 LAB — MAGNESIUM: Magnesium: 1.8 mg/dL (ref 1.7–2.4)

## 2023-05-12 MED ORDER — LACTATED RINGERS IV BOLUS
1000.0000 mL | Freq: Once | INTRAVENOUS | Status: AC
  Administered 2023-05-12: 1000 mL via INTRAVENOUS

## 2023-05-12 MED ORDER — SODIUM CHLORIDE 0.9% FLUSH
3.0000 mL | Freq: Two times a day (BID) | INTRAVENOUS | Status: DC
  Administered 2023-05-13 – 2023-05-14 (×2): 3 mL via INTRAVENOUS
  Administered 2023-05-14 – 2023-05-15 (×2): 10 mL via INTRAVENOUS

## 2023-05-12 MED ORDER — SODIUM CHLORIDE 0.9% FLUSH
3.0000 mL | Freq: Two times a day (BID) | INTRAVENOUS | Status: DC
  Administered 2023-05-13 – 2023-05-25 (×24): 3 mL via INTRAVENOUS

## 2023-05-12 MED ORDER — ACETAMINOPHEN 650 MG RE SUPP: 650.0000 mg | Freq: Four times a day (QID) | RECTAL | Status: DC | PRN

## 2023-05-12 MED ORDER — ACETAMINOPHEN 325 MG PO TABS
650.0000 mg | ORAL_TABLET | Freq: Four times a day (QID) | ORAL | Status: DC | PRN
  Administered 2023-05-13 – 2023-05-18 (×9): 650 mg via ORAL
  Filled 2023-05-12 (×12): qty 2

## 2023-05-12 MED ORDER — MORPHINE SULFATE (PF) 2 MG/ML IV SOLN
1.0000 mg | INTRAVENOUS | Status: AC | PRN
  Administered 2023-05-13 (×2): 1 mg via INTRAVENOUS
  Filled 2023-05-12 (×2): qty 1

## 2023-05-12 MED ORDER — ENSURE ENLIVE PO LIQD: 1.0000 | ORAL | Status: DC

## 2023-05-12 MED ORDER — FAMOTIDINE 20 MG PO TABS
20.0000 mg | ORAL_TABLET | Freq: Two times a day (BID) | ORAL | Status: DC
  Administered 2023-05-13 – 2023-05-20 (×14): 20 mg via ORAL
  Filled 2023-05-12 (×15): qty 1

## 2023-05-12 MED ORDER — ACETAMINOPHEN 500 MG PO TABS
1000.0000 mg | ORAL_TABLET | Freq: Once | ORAL | Status: AC
Start: 2023-05-12 — End: 2023-05-12
  Administered 2023-05-12: 1000 mg via ORAL

## 2023-05-12 MED ORDER — SODIUM CHLORIDE 0.9% FLUSH: 3.0000 mL | INTRAVENOUS | Status: DC | PRN

## 2023-05-12 MED ORDER — ACETAMINOPHEN 500 MG PO TABS
ORAL_TABLET | ORAL | Status: AC
  Filled 2023-05-12: qty 2

## 2023-05-12 MED ORDER — ONDANSETRON HCL 4 MG/2ML IJ SOLN
4.0000 mg | Freq: Once | INTRAMUSCULAR | Status: AC
  Administered 2023-05-12: 4 mg via INTRAVENOUS
  Filled 2023-05-12: qty 2

## 2023-05-12 MED ORDER — HEPARIN SODIUM (PORCINE) 5000 UNIT/ML IJ SOLN
5000.0000 [IU] | Freq: Two times a day (BID) | INTRAMUSCULAR | Status: AC
  Administered 2023-05-13 – 2023-05-14 (×4): 5000 [IU] via SUBCUTANEOUS
  Filled 2023-05-12 (×5): qty 1

## 2023-05-12 NOTE — Assessment & Plan Note (Signed)
2/2 to hypoalbuminemia. We will add TFT. Neg for DVT on recent LE USG.

## 2023-05-12 NOTE — Assessment & Plan Note (Signed)
APS report filed by CM in ed.  Conflicting statements, seems like more social and economic issues as daughter also does not work.  No source of income.  Daughter does not cook stating that she does not know how to cook, she is 58 year old and per daughter she is dealing with her own mental illness and keeps saying that if she come back home she will leave. They have applied for Medicaid 2 weeks ago-awaiting approval Per patient she was feeling very weak due to poor nutrition and unable to get any food for days so she cannot function anymore.  -Social worker consult -Psych consult was placed -PT/OT evaluation

## 2023-05-12 NOTE — ED Notes (Addendum)
Notified Dr. Vicente Males patients BP trending lower now since arrival. 96/47 MAP below 65. 1 L of LR ordered.

## 2023-05-12 NOTE — ED Triage Notes (Signed)
Patient c/o emesis, low  grade fever, sputum, and body aches that started last night.   Patient reports she has a tiny sacral bedsore and was seen here on 1/12 and dressing was changed and she was given supplies.
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
## 2023-05-12 NOTE — Assessment & Plan Note (Signed)
Nutrition consult. / Started ensure one daily and  advance to 2 as tolerated.
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
## 2023-05-13 ENCOUNTER — Encounter: Payer: Self-pay | Admitting: Internal Medicine

## 2023-05-13 DIAGNOSIS — B338 Other specified viral diseases: Secondary | ICD-10-CM

## 2023-05-13 DIAGNOSIS — Z87898 Personal history of other specified conditions: Secondary | ICD-10-CM

## 2023-05-13 DIAGNOSIS — D649 Anemia, unspecified: Secondary | ICD-10-CM

## 2023-05-13 DIAGNOSIS — E43 Unspecified severe protein-calorie malnutrition: Secondary | ICD-10-CM

## 2023-05-13 DIAGNOSIS — T7611XA Adult physical abuse, suspected, initial encounter: Secondary | ICD-10-CM | POA: Diagnosis not present

## 2023-05-13 DIAGNOSIS — L8990 Pressure ulcer of unspecified site, unspecified stage: Secondary | ICD-10-CM | POA: Insufficient documentation

## 2023-05-13 LAB — CBC
HCT: 23.5 % — ABNORMAL LOW (ref 36.0–46.0)
HCT: 24.4 % — ABNORMAL LOW (ref 36.0–46.0)
Hemoglobin: 7.5 g/dL — ABNORMAL LOW (ref 12.0–15.0)
Hemoglobin: 8 g/dL — ABNORMAL LOW (ref 12.0–15.0)
MCH: 27.3 pg (ref 26.0–34.0)
MCH: 27.4 pg (ref 26.0–34.0)
MCHC: 31.9 g/dL (ref 30.0–36.0)
MCHC: 32.8 g/dL (ref 30.0–36.0)
MCV: 85.5 fL (ref 80.0–100.0)
MCV: 83.6 fL (ref 80.0–100.0)
Platelets: 144 10*3/uL — ABNORMAL LOW (ref 150–400)
Platelets: 353 10*3/uL (ref 150–400)
RBC: 2.75 MIL/uL — ABNORMAL LOW (ref 3.87–5.11)
RBC: 2.92 MIL/uL — ABNORMAL LOW (ref 3.87–5.11)
RDW: 21.1 % — ABNORMAL HIGH (ref 11.5–15.5)
RDW: 21 % — ABNORMAL HIGH (ref 11.5–15.5)
WBC: 4.4 10*3/uL (ref 4.0–10.5)
WBC: 4.3 10*3/uL (ref 4.0–10.5)
nRBC: 0 % (ref 0.0–0.2)
nRBC: 0 % (ref 0.0–0.2)

## 2023-05-13 LAB — RETICULOCYTES
Immature Retic Fract: 14.1 % (ref 2.3–15.9)
RBC.: 2.91 MIL/uL — ABNORMAL LOW (ref 3.87–5.11)
Retic Count, Absolute: 65.5 10*3/uL (ref 19.0–186.0)
Retic Ct Pct: 2.3 % (ref 0.4–3.1)

## 2023-05-13 LAB — COMPREHENSIVE METABOLIC PANEL
ALT: 43 U/L (ref 0–44)
AST: 54 U/L — ABNORMAL HIGH (ref 15–41)
Albumin: <1.5 g/dL — ABNORMAL LOW (ref 3.5–5.0)
Alkaline Phosphatase: 58 U/L (ref 38–126)
Anion gap: 6 (ref 5–15)
BUN: 10 mg/dL (ref 6–20)
CO2: 22 mmol/L (ref 22–32)
Calcium: 7.4 mg/dL — ABNORMAL LOW (ref 8.9–10.3)
Chloride: 104 mmol/L (ref 98–111)
Creatinine, Ser: 0.47 mg/dL (ref 0.44–1.00)
GFR, Estimated: >60 mL/min (ref >60)
Glucose, Bld: 72 mg/dL (ref 70–99)
Potassium: 4.1 mmol/L (ref 3.5–5.1)
Sodium: 132 mmol/L — ABNORMAL LOW (ref 135–145)
Total Bilirubin: 0.9 mg/dL (ref 0.0–1.2)
Total Protein: 5.8 g/dL — ABNORMAL LOW (ref 6.5–8.1)

## 2023-05-13 LAB — TSH: TSH: 8.062 u[IU]/mL — ABNORMAL HIGH (ref 0.350–4.500)

## 2023-05-13 LAB — T4, FREE: Free T4: 1.08 ng/dL (ref 0.61–1.12)

## 2023-05-13 LAB — TYPE AND SCREEN
ABO/RH(D): A POS
Antibody Screen: NEGATIVE

## 2023-05-13 LAB — CK: Total CK: 462 U/L — ABNORMAL HIGH (ref 38–234)

## 2023-05-13 LAB — MAGNESIUM: Magnesium: 1.7 mg/dL (ref 1.7–2.4)

## 2023-05-13 LAB — IRON AND TIBC: Iron: 38 ug/dL (ref 28–170)

## 2023-05-13 LAB — FERRITIN: Ferritin: 693 ng/mL — ABNORMAL HIGH (ref 11–307)

## 2023-05-13 LAB — HIV ANTIBODY (ROUTINE TESTING W REFLEX): HIV Screen 4th Generation wRfx: NONREACTIVE

## 2023-05-13 LAB — FOLATE: Folate: 12.1 ng/mL (ref >5.9)

## 2023-05-13 LAB — VITAMIN B12: Vitamin B-12: 749 pg/mL (ref 180–914)

## 2023-05-13 LAB — PHOSPHORUS: Phosphorus: 2.9 mg/dL (ref 2.5–4.6)

## 2023-05-13 MED ORDER — VITAMIN C 500 MG PO TABS
500.0000 mg | ORAL_TABLET | Freq: Two times a day (BID) | ORAL | Status: DC
  Administered 2023-05-13 – 2023-05-25 (×23): 500 mg via ORAL
  Filled 2023-05-13 (×24): qty 1

## 2023-05-13 MED ORDER — PANTOPRAZOLE SODIUM 40 MG IV SOLR
40.0000 mg | Freq: Two times a day (BID) | INTRAVENOUS | Status: DC
Start: 2023-05-13 — End: 2023-05-17
  Administered 2023-05-13 – 2023-05-17 (×9): 40 mg via INTRAVENOUS
  Filled 2023-05-13 (×10): qty 10

## 2023-05-13 MED ORDER — INFUVITE ADULT IV SOLN
Freq: Once | INTRAVENOUS | Status: AC
  Filled 2023-05-13: qty 10

## 2023-05-13 MED ORDER — FE FUM-VIT C-VIT B12-FA 460-60-0.01-1 MG PO CAPS
1.0000 | ORAL_CAPSULE | Freq: Two times a day (BID) | ORAL | Status: DC
  Administered 2023-05-13 – 2023-05-25 (×21): 1 via ORAL
  Filled 2023-05-13 (×25): qty 1

## 2023-05-13 MED ORDER — MIRTAZAPINE 15 MG PO TBDP
15.0000 mg | ORAL_TABLET | Freq: Every day | ORAL | Status: DC
  Administered 2023-05-13 – 2023-05-24 (×11): 15 mg via ORAL
  Filled 2023-05-13 (×12): qty 1

## 2023-05-13 MED ORDER — ENSURE ENLIVE PO LIQD
237.0000 mL | Freq: Two times a day (BID) | ORAL | Status: DC
  Administered 2023-05-13 – 2023-05-18 (×10): 237 mL via ORAL

## 2023-05-13 MED ORDER — ZINC SULFATE 220 (50 ZN) MG PO CAPS
220.0000 mg | ORAL_CAPSULE | Freq: Every day | ORAL | Status: DC
  Administered 2023-05-13 – 2023-05-25 (×12): 220 mg via ORAL
  Filled 2023-05-13 (×13): qty 1

## 2023-05-13 MED ORDER — ADULT MULTIVITAMIN W/MINERALS CH
1.0000 | ORAL_TABLET | Freq: Every day | ORAL | Status: DC
  Administered 2023-05-13 – 2023-05-25 (×12): 1 via ORAL
  Filled 2023-05-13 (×13): qty 1

## 2023-05-13 MED ORDER — ONDANSETRON HCL 4 MG/2ML IJ SOLN
4.0000 mg | Freq: Once | INTRAMUSCULAR | Status: AC
  Administered 2023-05-13: 4 mg via INTRAVENOUS
  Filled 2023-05-13: qty 2

## 2023-05-13 NOTE — ED Notes (Signed)
Pt assisted with bed pan.  Assisted with lunch tray set up.

## 2023-05-13 NOTE — ED Notes (Signed)
Confirmed pt being monitored by CCMD.

## 2023-05-13 NOTE — ED Notes (Signed)
Dietary contacted for ensure 

## 2023-05-13 NOTE — ED Notes (Signed)
Pt repositioned in bed, new blankets provided as requested. Call bell in reach.

## 2023-05-13 NOTE — ED Notes (Addendum)
Pt ate approx 40% of meal and drank ensure

## 2023-05-13 NOTE — ED Notes (Signed)
Pt eating breakfast tray at this time
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
## 2023-05-13 NOTE — Assessment & Plan Note (Addendum)
Respiratory panel positive for RSV, having some cough and upper respiratory symptoms.  Chest x-ray without any acute abnormality -Supportive care
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
## 2023-05-13 NOTE — ED Notes (Signed)
Lab contacted to collect labs.

## 2023-05-13 NOTE — Assessment & Plan Note (Signed)
Hemoglobin decreased to 8 today, all cell line decreased likely some dilutional effect.  Concern of malnutrition. Iron at 38 with elevated ferritin, TIBC was not done. Folate at 12 with B12 pending. -Also started on supplement
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
## 2023-05-14 DIAGNOSIS — T7611XA Adult physical abuse, suspected, initial encounter: Secondary | ICD-10-CM | POA: Diagnosis not present

## 2023-05-14 DIAGNOSIS — F32A Depression, unspecified: Secondary | ICD-10-CM | POA: Diagnosis not present

## 2023-05-14 DIAGNOSIS — Z87898 Personal history of other specified conditions: Secondary | ICD-10-CM | POA: Diagnosis not present

## 2023-05-14 DIAGNOSIS — B338 Other specified viral diseases: Secondary | ICD-10-CM | POA: Diagnosis not present

## 2023-05-14 DIAGNOSIS — E43 Unspecified severe protein-calorie malnutrition: Secondary | ICD-10-CM | POA: Diagnosis not present
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
## 2023-05-14 NOTE — Progress Notes (Signed)
   05/14/23 1345  Assess: MEWS Score  Temp 99.2 F (37.3 C)  BP (!) 99/56  MAP (mmHg) 69  Pulse Rate (!) 109  Resp 16  SpO2 100 %  Assess: MEWS Score  MEWS Temp 0  MEWS Systolic 1  MEWS Pulse 1  MEWS RR 0  MEWS LOC 0  MEWS Score 2  MEWS Score Color Yellow  Assess: if the MEWS score is Yellow or Red  Were vital signs accurate and taken at a resting state? Yes  Does the patient meet 2 or more of the SIRS criteria? No  Does the patient have a confirmed or suspected source of infection? Yes  MEWS guidelines implemented  No, previously yellow, continue vital signs every 4 hours  Assess: SIRS CRITERIA  SIRS Temperature  0  SIRS Respirations  0  SIRS Pulse 1  SIRS WBC 0  SIRS Score Sum  1

## 2023-05-14 NOTE — Assessment & Plan Note (Addendum)
APS report filed by CM in ed.  Conflicting statements, seems like more social and economic issues as daughter also does not work.  No source of income.  Daughter does not cook stating that she does not know how to cook, she is 58 year old and per daughter she is dealing with her own mental illness and keeps saying that if she come back home she will leave. They have applied for Medicaid 2 weeks ago-awaiting approval Per patient she was feeling very weak due to poor nutrition and unable to get any food for days so she cannot function anymore.  -Social worker consult -Psych consult was obtained and she does not qualify for inpatient behavioral health.  They are recommending continuation of Remeron and outpatient follow-up -PT/OT is recommending SNF

## 2023-05-14 NOTE — Plan of Care (Signed)
Patient alert and oriented. Patient encouraged to call for assistance when needed. Patient assisted with intake and output. No acute distress noted. Will continue to monitor.   Problem: Education: Goal: Knowledge of General Education information will improve Description: Including pain rating scale, medication(s)/side effects and non-pharmacologic comfort measures Outcome: Progressing   Problem: Health Behavior/Discharge Planning: Goal: Ability to manage health-related needs will improve Outcome: Progressing   Problem: Clinical Measurements: Goal: Ability to maintain clinical measurements within normal limits will improve Outcome: Progressing Goal: Diagnostic test results will improve Outcome: Progressing Goal: Respiratory complications will improve Outcome: Progressing   Problem: Coping: Goal: Level of anxiety will decrease Outcome: Progressing   Problem: Elimination: Goal: Will not experience complications related to bowel motility Outcome: Progressing   Problem: Pain Managment: Goal: General experience of comfort will improve and/or be controlled Outcome: Progressing   Problem: Safety: Goal: Ability to remain free from injury will improve Outcome: Progressing

## 2023-05-14 NOTE — Progress Notes (Signed)
OT Cancellation Note  Patient Details Name: Sheila Lang MRN: [id number] DOB: Aug 04, 1965   Cancelled Treatment:    Reason Eval/Treat Not Completed: Pain limiting ability to participate. . Per chart pt declining mobility attempts citing pain however refused pain medication. Will hold and initiate services as pt willing to participate.  Kathie Dike, M.S. OTR/L  05/14/23, 10:22 AM  ascom [phone]

## 2023-05-14 NOTE — Assessment & Plan Note (Addendum)
Hemoglobin decreased to6.7 today, no obvious bleeding Iron at 24 with elevated ferritin, TIBC was not done. Folate at 12 and B12 at 749. -Ordered 1 unit of PRBC -Also started on supplement
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
## 2023-05-14 NOTE — Progress Notes (Signed)
   05/14/23 0945  Assess: MEWS Score  Temp (!) 101 F (38.3 C)  BP 114/66  MAP (mmHg) 80  Pulse Rate (!) 117  Resp 18  SpO2 100 %  O2 Device Room Air  Assess: MEWS Score  MEWS Temp 1  MEWS Systolic 0  MEWS Pulse 2  MEWS RR 0  MEWS LOC 0  MEWS Score 3  MEWS Score Color Yellow  Assess: if the MEWS score is Yellow or Red  Were vital signs accurate and taken at a resting state? Yes  Does the patient meet 2 or more of the SIRS criteria? Yes  Does the patient have a confirmed or suspected source of infection? Yes  MEWS guidelines implemented  Yes, yellow  Treat  MEWS Interventions Considered administering scheduled or prn medications/treatments as ordered  Take Vital Signs  Increase Vital Sign Frequency  Yellow: Q2hr x1, continue Q4hrs until patient remains green for 12hrs  Escalate  MEWS: Escalate Yellow: Discuss with charge nurse and consider notifying provider and/or RRT  Notify: Charge Nurse/RN  Name of Charge Nurse/RN Notified Janie, RN  Provider Notification  Provider Name/Title Dr. Nelson Chimes  Date Provider Notified 05/14/23  Time Provider Notified 0945  Method of Notification Face-to-face  Notification Reason Other (Comment) (yellow MEWS score)  Provider response No new orders  Date of Provider Response 05/14/23  Time of Provider Response 0945  Assess: SIRS CRITERIA  SIRS Temperature  1  SIRS Respirations  0  SIRS Pulse 1  SIRS WBC 0  SIRS Score Sum  2
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
## 2023-05-14 NOTE — Assessment & Plan Note (Addendum)
Estimated body mass index is 19.85 kg/m as calculated from the following:   Height as of this encounter: 5' (1.524 m).   Weight as of this encounter: 46.1 kg.   Nutrition consult-supplements were added

## 2023-05-14 NOTE — Assessment & Plan Note (Signed)
Psych evaluated her.  Does not need inpatient behavioral health.  Patient feel very depressed and blaming daughter's behavior. -Remeron was started -Need outpatient psych follow-up and individual psychotherapy

## 2023-05-14 NOTE — Assessment & Plan Note (Signed)
2/2 to hypoalbuminemia. Neg for DVT on recent LE USG.

## 2023-05-14 NOTE — Plan of Care (Signed)

## 2023-05-14 NOTE — Progress Notes (Addendum)
PT Cancellation Note  Patient Details Name: Sheila Lang MRN: [id number] DOB: 10/18/1965  Addendum: Patient continues to have fever of 101. Will hold PT eval until tomorrow.     Cancelled Treatment:    Reason Eval/Treat Not Completed: Pain limiting ability to participate. Patient declines mobility at this time due to pain. RN in room giving medications. Will re-attempt mobility later today.    [name] 05/14/2023, 10:16 AM

## 2023-05-15 DIAGNOSIS — T7611XA Adult physical abuse, suspected, initial encounter: Secondary | ICD-10-CM | POA: Diagnosis not present

## 2023-05-15 DIAGNOSIS — B338 Other specified viral diseases: Secondary | ICD-10-CM | POA: Diagnosis not present

## 2023-05-15 DIAGNOSIS — E43 Unspecified severe protein-calorie malnutrition: Secondary | ICD-10-CM | POA: Diagnosis not present

## 2023-05-15 DIAGNOSIS — Z87898 Personal history of other specified conditions: Secondary | ICD-10-CM | POA: Diagnosis not present

## 2023-05-15 MED ORDER — STERILE WATER FOR INJECTION IJ SOLN
INTRAMUSCULAR | Status: AC
  Administered 2023-05-15: 10 mL
  Filled 2023-05-15: qty 10

## 2023-05-15 MED ORDER — ONDANSETRON HCL 4 MG/2ML IJ SOLN
4.0000 mg | Freq: Four times a day (QID) | INTRAMUSCULAR | Status: DC | PRN
  Administered 2023-05-15 (×2): 4 mg via INTRAVENOUS
  Filled 2023-05-15 (×2): qty 2

## 2023-05-15 MED ORDER — GUAIFENESIN 100 MG/5ML PO LIQD
5.0000 mL | ORAL | Status: DC | PRN
  Administered 2023-05-15 – 2023-05-25 (×15): 5 mL via ORAL
  Filled 2023-05-15 (×16): qty 10
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
## 2023-05-15 NOTE — Plan of Care (Signed)

## 2023-05-15 NOTE — TOC Progression Note (Signed)
Transition of Care [facility]) - Progression Note    Patient Details  Name: Sheila Lang MRN: [id number] Date of Birth: 02/01/1966  [facility]) CM/SW Contact  Marlowe Sax, RN Phone Number: 05/15/2023, 3:31 PM  Clinical Narrative:    Reached out to the daughter [name], she does not answer and their is no VM to leave a message, FL2, PASSR and bedsearch completed, will review bed offers once obtained        Expected Discharge Plan and Services                                               Social Determinants of Health (SDOH) Interventions SDOH Screenings   Food Insecurity: Food Insecurity Present (05/13/2023)  Housing: Low Risk  (05/13/2023)  Transportation Needs: Unmet Transportation Needs (05/13/2023)  Utilities: Not At Risk (05/13/2023)  Alcohol Screen: Low Risk  (05/23/2019)  Depression (PHQ2-9): Low Risk  (08/20/2020)  Social Connections: Unknown (05/13/2023)  Tobacco Use: Low Risk  (05/13/2023)    Readmission Risk Interventions     No data to display

## 2023-05-15 NOTE — Progress Notes (Addendum)
0800 patient alert x4 able to make all needs known on room air refusing mobility. Patient has concerns RA getting worse. Patient only has one IV in place not 2 LDA removed. 1000 patient able to feed self not eating much gave supplemental shake and PRN tylenol so patient can work with therapy.  1107 patient up to chair with therapy already asking to go to bed education on mobility for placement is important  1630 patient had extra large emesis eggs from breakfast and ensure drinks bath completed

## 2023-05-16 DIAGNOSIS — B338 Other specified viral diseases: Secondary | ICD-10-CM | POA: Diagnosis not present

## 2023-05-16 DIAGNOSIS — Z87898 Personal history of other specified conditions: Secondary | ICD-10-CM | POA: Diagnosis not present

## 2023-05-16 DIAGNOSIS — E43 Unspecified severe protein-calorie malnutrition: Secondary | ICD-10-CM | POA: Diagnosis not present

## 2023-05-16 DIAGNOSIS — T7611XA Adult physical abuse, suspected, initial encounter: Secondary | ICD-10-CM | POA: Diagnosis not present

## 2023-05-16 LAB — IRON AND TIBC: Iron: 24 ug/dL — ABNORMAL LOW (ref 28–170)

## 2023-05-16 LAB — CBC
HCT: 20 % — ABNORMAL LOW (ref 36.0–46.0)
Hemoglobin: 6.7 g/dL — ABNORMAL LOW (ref 12.0–15.0)
MCH: 26.8 pg (ref 26.0–34.0)
MCHC: 33.5 g/dL (ref 30.0–36.0)
MCV: 80 fL (ref 80.0–100.0)
Platelets: 344 10*3/uL (ref 150–400)
RBC: 2.5 MIL/uL — ABNORMAL LOW (ref 3.87–5.11)
RDW: 21.1 % — ABNORMAL HIGH (ref 11.5–15.5)
WBC: 6.6 10*3/uL (ref 4.0–10.5)
nRBC: 0 % (ref 0.0–0.2)

## 2023-05-16 LAB — BASIC METABOLIC PANEL
Anion gap: 5 (ref 5–15)
BUN: 11 mg/dL (ref 6–20)
CO2: 26 mmol/L (ref 22–32)
Calcium: 7.2 mg/dL — ABNORMAL LOW (ref 8.9–10.3)
Chloride: 103 mmol/L (ref 98–111)
Creatinine, Ser: 0.46 mg/dL (ref 0.44–1.00)
GFR, Estimated: >60 mL/min (ref >60)
Glucose, Bld: 68 mg/dL — ABNORMAL LOW (ref 70–99)
Potassium: 3.6 mmol/L (ref 3.5–5.1)
Sodium: 134 mmol/L — ABNORMAL LOW (ref 135–145)

## 2023-05-16 LAB — PHOSPHORUS: Phosphorus: 3.1 mg/dL (ref 2.5–4.6)

## 2023-05-16 LAB — MAGNESIUM: Magnesium: 1.8 mg/dL (ref 1.7–2.4)

## 2023-05-16 LAB — PREPARE RBC (CROSSMATCH)

## 2023-05-16 MED ORDER — CALCIUM CARBONATE 1250 (500 CA) MG PO TABS
1250.0000 mg | ORAL_TABLET | Freq: Every day | ORAL | Status: DC
  Administered 2023-05-17 – 2023-05-25 (×9): 1250 mg via ORAL
  Filled 2023-05-16 (×9): qty 1

## 2023-05-16 MED ORDER — TRAMADOL HCL 50 MG PO TABS
50.0000 mg | ORAL_TABLET | Freq: Two times a day (BID) | ORAL | Status: DC | PRN
  Administered 2023-05-17 – 2023-05-24 (×10): 50 mg via ORAL
  Filled 2023-05-16 (×10): qty 1

## 2023-05-16 MED ORDER — SODIUM CHLORIDE 0.9% IV SOLUTION: Freq: Once | INTRAVENOUS | Status: AC

## 2023-05-16 MED ORDER — ORAL CARE MOUTH RINSE: 15.0000 mL | OROMUCOSAL | Status: DC | PRN

## 2023-05-16 MED ORDER — MAGNESIUM GLUCONATE 500 MG PO TABS
500.0000 mg | ORAL_TABLET | Freq: Every day | ORAL | Status: DC
  Administered 2023-05-16 – 2023-05-25 (×9): 500 mg via ORAL
  Filled 2023-05-16 (×10): qty 1

## 2023-05-16 NOTE — Plan of Care (Signed)
Patient alert and oriented. Complained of nausea and dry cough. Medicated with positive effect. Patient took I broth and clear liquids this shift with minor abdominal cramps noted. Patient encouraged to slowly increase intake. Turned and repositioned throughout shift. All needs attended to. Personal, monetary belongings given to security, key placed in patient chart. Will continue to monitor.   Problem: Education: Goal: Knowledge of General Education information will improve Description: Including pain rating scale, medication(s)/side effects and non-pharmacologic comfort measures Outcome: Progressing   Problem: Health Behavior/Discharge Planning: Goal: Ability to manage health-related needs will improve Outcome: Progressing   Problem: Clinical Measurements: Goal: Ability to maintain clinical measurements within normal limits will improve Outcome: Progressing   Problem: Clinical Measurements: Goal: Will remain free from infection Outcome: Progressing   Problem: Clinical Measurements: Goal: Diagnostic test results will improve Outcome: Progressing   Problem: Clinical Measurements: Goal: Respiratory complications will improve Outcome: Progressing   Problem: Nutrition: Goal: Adequate nutrition will be maintained Outcome: Progressing   Problem: Coping: Goal: Level of anxiety will decrease Outcome: Progressing   Problem: Elimination: Goal: Will not experience complications related to bowel motility Outcome: Progressing   Problem: Elimination: Goal: Will not experience complications related to urinary retention Outcome: Progressing   Problem: Pain Managment: Goal: General experience of comfort will improve and/or be controlled Outcome: Progressing
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
## 2023-05-16 NOTE — Plan of Care (Signed)

## 2023-05-17 DIAGNOSIS — T7611XA Adult physical abuse, suspected, initial encounter: Secondary | ICD-10-CM | POA: Diagnosis not present

## 2023-05-17 DIAGNOSIS — B338 Other specified viral diseases: Secondary | ICD-10-CM | POA: Diagnosis not present

## 2023-05-17 DIAGNOSIS — Z87898 Personal history of other specified conditions: Secondary | ICD-10-CM | POA: Diagnosis not present

## 2023-05-17 DIAGNOSIS — E43 Unspecified severe protein-calorie malnutrition: Secondary | ICD-10-CM | POA: Diagnosis not present

## 2023-05-17 LAB — BASIC METABOLIC PANEL
Anion gap: 4 — ABNORMAL LOW (ref 5–15)
BUN: 11 mg/dL (ref 6–20)
CO2: 26 mmol/L (ref 22–32)
Calcium: 7.2 mg/dL — ABNORMAL LOW (ref 8.9–10.3)
Chloride: 105 mmol/L (ref 98–111)
Creatinine, Ser: 0.38 mg/dL — ABNORMAL LOW (ref 0.44–1.00)
GFR, Estimated: >60 mL/min (ref >60)
Glucose, Bld: 106 mg/dL — ABNORMAL HIGH (ref 70–99)
Potassium: 4 mmol/L (ref 3.5–5.1)
Sodium: 135 mmol/L (ref 135–145)

## 2023-05-17 LAB — CBC
HCT: 27.2 % — ABNORMAL LOW (ref 36.0–46.0)
Hemoglobin: 9.4 g/dL — ABNORMAL LOW (ref 12.0–15.0)
MCH: 28.7 pg (ref 26.0–34.0)
MCHC: 34.6 g/dL (ref 30.0–36.0)
MCV: 82.9 fL (ref 80.0–100.0)
Platelets: 398 10*3/uL (ref 150–400)
RBC: 3.28 MIL/uL — ABNORMAL LOW (ref 3.87–5.11)
RDW: 19.8 % — ABNORMAL HIGH (ref 11.5–15.5)
WBC: 7.6 10*3/uL (ref 4.0–10.5)
nRBC: 0.3 % — ABNORMAL HIGH (ref 0.0–0.2)

## 2023-05-17 MED ORDER — PANTOPRAZOLE SODIUM 40 MG PO TBEC
40.0000 mg | DELAYED_RELEASE_TABLET | Freq: Two times a day (BID) | ORAL | Status: DC
  Administered 2023-05-17 – 2023-05-25 (×16): 40 mg via ORAL
  Filled 2023-05-17 (×16): qty 1

## 2023-05-17 NOTE — Progress Notes (Signed)
Mobility Specialist - Progress Note     05/17/23 1506  Mobility  Activity Ambulated with assistance in room;Stood at bedside;Transferred from bed to chair  Level of Assistance +2 (takes two people)  Press photographer wheel walker  Distance Ambulated (ft) 3 ft  Range of Motion/Exercises Active  Activity Response Tolerated well  Mobility Referral Yes  Mobility visit 1 Mobility  Mobility Specialist Start Time (ACUTE ONLY) 1447  Mobility Specialist Stop Time (ACUTE ONLY) 1500  Mobility Specialist Time Calculation (min) (ACUTE ONLY) 13 min   Pt resting in bed on 2L upon entry. Pt STS +2 and took several steps toward window before turn pivot to recliner with RW. Pt left in recliner with needs in reach and chair alarm activated.   Johnathan Hausen Mobility Specialist 05/17/23, 3:14 PM
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
## 2023-05-18 DIAGNOSIS — T7611XA Adult physical abuse, suspected, initial encounter: Secondary | ICD-10-CM | POA: Diagnosis not present

## 2023-05-18 DIAGNOSIS — E43 Unspecified severe protein-calorie malnutrition: Secondary | ICD-10-CM | POA: Diagnosis not present

## 2023-05-18 DIAGNOSIS — Z87898 Personal history of other specified conditions: Secondary | ICD-10-CM | POA: Diagnosis not present

## 2023-05-18 DIAGNOSIS — B338 Other specified viral diseases: Secondary | ICD-10-CM | POA: Diagnosis not present

## 2023-05-18 MED ORDER — LACTULOSE 10 GM/15ML PO SOLN
30.0000 g | Freq: Every day | ORAL | Status: DC
  Administered 2023-05-18 – 2023-05-20 (×3): 30 g via ORAL
  Filled 2023-05-18 (×3): qty 60

## 2023-05-18 MED ORDER — ALUM & MAG HYDROXIDE-SIMETH 200-200-20 MG/5ML PO SUSP
30.0000 mL | Freq: Once | ORAL | Status: AC
  Administered 2023-05-18: 30 mL via ORAL
  Filled 2023-05-18: qty 30

## 2023-05-18 NOTE — Plan of Care (Signed)

## 2023-05-18 NOTE — Plan of Care (Signed)
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
## 2023-05-18 NOTE — Progress Notes (Signed)
Mobility Specialist - Progress Note   05/18/23 1600  Mobility  Activity Turned to right side;Turned to back - supine;Refused mobility  Level of Assistance Maximum assist, patient does 25-49%  Mobility visit 1 Mobility  Mobility Specialist Start Time (ACUTE ONLY) 1015  Mobility Specialist Stop Time (ACUTE ONLY) 1021  Mobility Specialist Time Calculation (min) (ACUTE ONLY) 6 min     Pt lying in bed upon arrival, utilizing RA. Pt declined OOB mobility at this time d/t pain. Pt reports stiffness and requests to be repositioned to R side. Pillow wedge support for back and LUE provided. Will attempt another date/time as appropriate.    Sheila Lang Mobility Specialist 05/18/23, 4:10 PM

## 2023-05-18 NOTE — Progress Notes (Signed)
PT Cancellation Note  Patient Details Name: Sheila Lang MRN: [id number] DOB: 1965-10-03   Cancelled Treatment:    Reason Eval/Treat Not Completed: Other (comment). Treatment attempted, however pt reports she has been up for 4 hours in the chair and just returned to bed and got comfortable. Will re-attempt another date.   [name] 05/18/2023, 11:47 AM Elizabeth Palau, PT, DPT, GCS [phone]

## 2023-05-19 DIAGNOSIS — B338 Other specified viral diseases: Secondary | ICD-10-CM | POA: Diagnosis not present

## 2023-05-19 DIAGNOSIS — E43 Unspecified severe protein-calorie malnutrition: Secondary | ICD-10-CM | POA: Diagnosis not present

## 2023-05-19 DIAGNOSIS — Z87898 Personal history of other specified conditions: Secondary | ICD-10-CM | POA: Diagnosis not present

## 2023-05-19 DIAGNOSIS — T7611XA Adult physical abuse, suspected, initial encounter: Secondary | ICD-10-CM | POA: Diagnosis not present

## 2023-05-19 MED ORDER — BOOST / RESOURCE BREEZE PO LIQD CUSTOM
1.0000 | Freq: Three times a day (TID) | ORAL | Status: DC
Start: 2023-05-19 — End: 2023-05-25
  Administered 2023-05-19 – 2023-05-25 (×16): 1 via ORAL

## 2023-05-19 MED ORDER — ENOXAPARIN SODIUM 30 MG/0.3ML IJ SOSY
30.0000 mg | PREFILLED_SYRINGE | INTRAMUSCULAR | Status: DC
  Filled 2023-05-19 (×4): qty 0.3

## 2023-05-19 NOTE — Plan of Care (Signed)
  Problem: Education: Goal: Knowledge of General Education information will improve Description: Including pain rating scale, medication(s)/side effects and non-pharmacologic comfort measures Outcome: Progressing   Problem: Clinical Measurements: Goal: Ability to maintain clinical measurements within normal limits will improve Outcome: Progressing   Problem: Activity: Goal: Risk for activity intolerance will decrease Outcome: Progressing   Problem: Nutrition: Goal: Adequate nutrition will be maintained Outcome: Progressing   Problem: Coping: Goal: Level of anxiety will decrease Outcome: Progressing   Problem: Safety: Goal: Ability to remain free from injury will improve Outcome: Progressing
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
## 2023-05-19 NOTE — Plan of Care (Signed)
Problem: Education: Goal: Knowledge of General Education information will improve Description: Including pain rating scale, medication(s)/side effects and non-pharmacologic comfort measures Outcome: Progressing   Problem: Activity: Goal: Risk for activity intolerance will decrease Outcome: Progressing   Problem: Nutrition: Goal: Adequate nutrition will be maintained Outcome: Progressing
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
## 2023-05-20 ENCOUNTER — Inpatient Hospital Stay: Payer: BLUE CROSS/BLUE SHIELD

## 2023-05-20 DIAGNOSIS — T7611XA Adult physical abuse, suspected, initial encounter: Secondary | ICD-10-CM | POA: Diagnosis not present

## 2023-05-20 LAB — CBC
HCT: 27.5 % — ABNORMAL LOW (ref 36.0–46.0)
Hemoglobin: 9.1 g/dL — ABNORMAL LOW (ref 12.0–15.0)
MCH: 27.9 pg (ref 26.0–34.0)
MCHC: 33.1 g/dL (ref 30.0–36.0)
MCV: 84.4 fL (ref 80.0–100.0)
Platelets: 478 10*3/uL — ABNORMAL HIGH (ref 150–400)
RBC: 3.26 MIL/uL — ABNORMAL LOW (ref 3.87–5.11)
RDW: 20.3 % — ABNORMAL HIGH (ref 11.5–15.5)
WBC: 7.2 10*3/uL (ref 4.0–10.5)
nRBC: 0 % (ref 0.0–0.2)

## 2023-05-20 LAB — RENAL FUNCTION PANEL
Albumin: <1.5 g/dL — ABNORMAL LOW (ref 3.5–5.0)
Anion gap: 7 (ref 5–15)
BUN: 10 mg/dL (ref 6–20)
CO2: 25 mmol/L (ref 22–32)
Calcium: 7.6 mg/dL — ABNORMAL LOW (ref 8.9–10.3)
Chloride: 97 mmol/L — ABNORMAL LOW (ref 98–111)
Creatinine, Ser: 0.46 mg/dL (ref 0.44–1.00)
GFR, Estimated: >60 mL/min (ref >60)
Glucose, Bld: 75 mg/dL (ref 70–99)
Phosphorus: 3.2 mg/dL (ref 2.5–4.6)
Potassium: 4.1 mmol/L (ref 3.5–5.1)
Sodium: 129 mmol/L — ABNORMAL LOW (ref 135–145)

## 2023-05-20 LAB — MAGNESIUM: Magnesium: 1.9 mg/dL (ref 1.7–2.4)

## 2023-05-20 LAB — TSH: TSH: 33.919 u[IU]/mL — ABNORMAL HIGH (ref 0.350–4.500)

## 2023-05-20 LAB — VITAMIN D 25 HYDROXY (VIT D DEFICIENCY, FRACTURES): Vit D, 25-Hydroxy: 38.23 ng/mL (ref 30–100)

## 2023-05-20 MED ORDER — BISACODYL 10 MG RE SUPP
10.0000 mg | Freq: Once | RECTAL | Status: AC
  Administered 2023-05-20: 10 mg via RECTAL
  Filled 2023-05-20: qty 1

## 2023-05-20 MED ORDER — MUSCLE RUB 10-15 % EX CREA
TOPICAL_CREAM | CUTANEOUS | Status: DC | PRN
  Filled 2023-05-20: qty 85

## 2023-05-20 MED ORDER — DILTIAZEM HCL 25 MG/5ML IV SOLN
5.0000 mg | Freq: Once | INTRAVENOUS | Status: DC
  Filled 2023-05-20: qty 5

## 2023-05-20 MED ORDER — BISACODYL 5 MG PO TBEC
10.0000 mg | DELAYED_RELEASE_TABLET | Freq: Every day | ORAL | Status: DC
  Administered 2023-05-20 – 2023-05-24 (×3): 10 mg via ORAL
  Filled 2023-05-20 (×3): qty 2

## 2023-05-20 MED ORDER — BISACODYL 5 MG PO TBEC
10.0000 mg | DELAYED_RELEASE_TABLET | Freq: Once | ORAL | Status: AC
  Administered 2023-05-20: 10 mg via ORAL
  Filled 2023-05-20: qty 2

## 2023-05-20 MED ORDER — SMOG ENEMA
400.0000 mL | Freq: Once | RECTAL | Status: AC
  Administered 2023-05-21: 400 mL via RECTAL
  Filled 2023-05-20: qty 960

## 2023-05-20 MED ORDER — BISACODYL 10 MG RE SUPP: 10.0000 mg | Freq: Every day | RECTAL | Status: DC | PRN

## 2023-05-20 MED ORDER — POLYETHYLENE GLYCOL 3350 17 G PO PACK
17.0000 g | PACK | Freq: Two times a day (BID) | ORAL | Status: DC
  Administered 2023-05-20 – 2023-05-25 (×7): 17 g via ORAL
  Filled 2023-05-20 (×7): qty 1

## 2023-05-20 NOTE — Progress Notes (Signed)
   05/20/23 1810  Assess: MEWS Score  Temp 98 F (36.7 C)  BP 115/64  MAP (mmHg) 78  Pulse Rate (!) 131  Resp 16  SpO2 100 %  O2 Device Room Air  Assess: MEWS Score  MEWS Temp 0  MEWS Systolic 0  MEWS Pulse 3  MEWS RR 0  MEWS LOC 0  MEWS Score 3  MEWS Score Color Yellow  Assess: if the MEWS score is Yellow or Red  Were vital signs accurate and taken at a resting state? Yes  Does the patient meet 2 or more of the SIRS criteria? No  Does the patient have a confirmed or suspected source of infection? Yes  MEWS guidelines implemented  Yes, yellow  Treat  MEWS Interventions Considered administering scheduled or prn medications/treatments as ordered  Take Vital Signs  Increase Vital Sign Frequency  Yellow: Q2hr x1, continue Q4hrs until patient remains green for 12hrs  Escalate  MEWS: Escalate Yellow: Discuss with charge nurse and consider notifying provider and/or RRT  Notify: Charge Nurse/RN  Name of Charge Nurse/RN Notified Bree, RN  Provider Notification  Provider Name/Title Gillis Santa, MD  Date Provider Notified 05/20/23  Time Provider Notified 1829  Method of Notification Page  Notification Reason Change in status  Provider response See new orders  Date of Provider Response 05/20/23  Time of Provider Response 1838  Assess: SIRS CRITERIA  SIRS Temperature  0  SIRS Respirations  0  SIRS Pulse 1  SIRS WBC 0  SIRS Score Sum  1
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
## 2023-05-20 NOTE — Plan of Care (Signed)
Problem: Clinical Measurements: Goal: Ability to maintain clinical measurements within normal limits will improve Outcome: Progressing Goal: Diagnostic test results will improve Outcome: Progressing   Problem: Activity: Goal: Risk for activity intolerance will decrease Outcome: Progressing   Problem: Nutrition: Goal: Adequate nutrition will be maintained Outcome: Progressing   Problem: Coping: Goal: Level of anxiety will decrease Outcome: Progressing

## 2023-05-20 NOTE — Progress Notes (Signed)
Pt HR 131. Gillis Santa, MD paged. See new order for diltiazem IV. Pt refusing RN to admin med at this time, stating she wants to "wait an hour". RN educated pt. Pt continues to refuse med at this time. Gillis Santa, MD aware.   Pt also continuing to refuse suppository at this time, requesting to "wait an hour" for this. Retimed in [facility]. Gillis Santa, MD aware.

## 2023-05-20 NOTE — TOC Progression Note (Signed)
Transition of Care [facility]) - Progression Note    Patient Details  Name: Sheila Lang MRN: [id number] Date of Birth: 1965/06/02  Transition of [facility]) CM/SW Contact  Marlowe Sax, RN Phone Number: 05/20/2023, 1:56 PM  Clinical Narrative:      Met with the patient and reviewed the bed offer in [facility], she stated that she used to work there and does not want to go there, she is agreeable to expanding the search . Bedsearch expanded      Expected Discharge Plan and Services                                               Social Determinants of Health (SDOH) Interventions SDOH Screenings   Food Insecurity: Food Insecurity Present (05/13/2023)  Housing: Low Risk  (05/13/2023)  Transportation Needs: Unmet Transportation Needs (05/13/2023)  Utilities: Not At Risk (05/13/2023)  Alcohol Screen: Low Risk  (05/23/2019)  Depression (PHQ2-9): Low Risk  (08/20/2020)  Social Connections: Unknown (05/13/2023)  Tobacco Use: Low Risk  (05/13/2023)    Readmission Risk Interventions     No data to display

## 2023-05-20 NOTE — Plan of Care (Signed)
  Problem: Activity: Goal: Risk for activity intolerance will decrease Outcome: Progressing   Problem: Pain Managment: Goal: General experience of comfort will improve and/or be controlled Outcome: Progressing   Problem: Safety: Goal: Ability to remain free from injury will improve Outcome: Progressing   Problem: Skin Integrity: Goal: Risk for impaired skin integrity will decrease Outcome: Progressing

## 2023-05-21 ENCOUNTER — Inpatient Hospital Stay (HOSPITAL_COMMUNITY)
Admit: 2023-05-21 | Discharge: 2023-05-21 | Disposition: A | Discharge status: Home / Self Care | Payer: BLUE CROSS/BLUE SHIELD | Attending: Student | Admitting: Student

## 2023-05-21 DIAGNOSIS — R9431 Abnormal electrocardiogram [ECG] [EKG]: Secondary | ICD-10-CM | POA: Diagnosis not present

## 2023-05-21 DIAGNOSIS — T7611XA Adult physical abuse, suspected, initial encounter: Secondary | ICD-10-CM | POA: Diagnosis not present

## 2023-05-21 LAB — ECHOCARDIOGRAM COMPLETE
AR max vel: 2.05 cm2
AV Area VTI: 2.04 cm2
AV Area mean vel: 1.95 cm2
AV Mean grad: 3 mm[Hg]
AV Peak grad: 7.2 mm[Hg]
Ao pk vel: 1.34 m/s
Area-P 1/2: 7.16 cm2
Calc EF: 56.1 %
Height: 60 in
MV VTI: 2.4 cm2
S' Lateral: 2 cm
Single Plane A2C EF: 58 %
Single Plane A4C EF: 53.7 %
Weight: 1289.6 [oz_av]

## 2023-05-21 LAB — T4, FREE: Free T4: 0.86 ng/dL (ref 0.61–1.12)

## 2023-05-21 MED ORDER — PREDNISONE 10 MG PO TABS
5.0000 mg | ORAL_TABLET | Freq: Every day | ORAL | Status: DC
  Administered 2023-05-21 – 2023-05-22 (×2): 5 mg via ORAL
  Filled 2023-05-21 (×2): qty 1
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
## 2023-05-21 NOTE — Plan of Care (Signed)
  Problem: Clinical Measurements: Goal: Diagnostic test results will improve Outcome: Progressing   Problem: Nutrition: Goal: Adequate nutrition will be maintained Outcome: Progressing   Problem: Coping: Goal: Level of anxiety will decrease Outcome: Progressing   Problem: Pain Managment: Goal: General experience of comfort will improve and/or be controlled Outcome: Progressing

## 2023-05-21 NOTE — Discharge Instructions (Signed)
[facility] offers a [employer] that anyone can call toll free at [phone]. The friendship line is available 24 hours a day  [facility] is a Program of All-inclusive Care for the Elderly (PACE). Their mission is to promote and sustain the independence of seniors wishing to remain in the community. They provide seniors with comprehensive long-term health, social, medical and dietary care. Their program is a safe alternative to nursing home care. [phone]  [facility] Physical Address [facility] [address] [place], [place] [address] Phone: [phone]. . Online zoom yoga class, connect with others without leaving your home [facility] offers [place] dance cardio sessions for individuals via Zoom. This program provides: - Dance fitness activities Please contact program for more information. Servinganyone in need adults 18+ hiv/aids individuals families Call [phone]  Email [email or web address] to get more info  [employer] offers an online [employer] to individuals where they can receive help to focus on their best health. Whether you're a [employer] member or not, the neighborhood center offers a... Main Serviceshealth education  exercise & fitness  community support services  recreation  virtual support Other Servicessupport groups Servinganyone in need adults young adults teens seniors individuals families [email or web address] to get more info  Schedule on their website  The [facility] offers an array of activities for adults age 27 and over. This program provides:- Fitness and health programs- Tech classes- Activity books Main Serviceshealth education  community support services  exercise & fitness  recreation  more education Servingseniors  Call [phone]    For more resources go online to [email or web address] and type in you zipcode

## 2023-05-21 NOTE — Plan of Care (Signed)

## 2023-05-21 NOTE — Progress Notes (Signed)
OT Cancellation Note  Patient Details Name: Corley Kohls MRN: [id number] DOB: [birth year]   Cancelled Treatment:    Reason Eval/Treat Not Completed: Patient at procedure or test/ unavailable. Chart reviewed. On arrival pt with ECHO at bed side, will hold and re-attempt as able.   Kathie Dike, M.S. OTR/L  05/21/23, 12:13 PM  ascom [phone]

## 2023-05-21 NOTE — Progress Notes (Signed)
PT Cancellation Note  Patient Details Name: Sheila Lang MRN: [id number] DOB: 1966/03/07   Cancelled Treatment:    Reason Eval/Treat Not Completed: Medical issues which prohibited therapy Patient has tachycardia. Will continue to follow.   Kwasi Joung 05/21/2023, 2:44 PM

## 2023-05-21 NOTE — TOC Progression Note (Addendum)
Transition of Care [facility]) - Progression Note    Patient Details  Name: Sheila Lang MRN: [id number] Date of Birth: 1965-11-30  Transition of Care [facility]) CM/SW Contact  Marlowe Sax, RN Phone Number: 05/21/2023, 11:34 AM  Clinical Narrative:    Met with the patient and reviewed each of the bed offers, she stated they are all too far away, she wants to go home with A RW and HH, I explained that [facility] only come a couple of times per week and they are not there for more than an hour, she stated that would be fine she does not have a preference of agency and is agreeable to me reaching out to see who can accept her I reached out to [place] at [place], [place] at [place], Velna Hatchet at [place], Mal Misty at [place], Adele Dan at [place] at [place]. Iantha Fallen is not in network, [facility] is not able to accept commercial at this time,  waiting to hear back from other agencies [facility] will deliver a RW to the bedside  Update [facility] and [facility] can not accept  Added resources to the discharge instructions  Expected Discharge Plan: Home w Home Health Services Barriers to Discharge: Barriers Resolved  Expected Discharge Plan and Services   Discharge Planning Services: CM Consult   Living arrangements for the past 2 months: Single Family Home                 DME Arranged: Walker rolling DME Agency: [facility] Date DME Agency Contacted: 05/21/23 Time DME Agency Contacted: 1128 Representative spoke with at DME Agency: Osvaldo Angst Arranged: PT, OT   Date HH Agency Contacted: 05/21/23 Time HH Agency Contacted: 1130     Social Determinants of Health (SDOH) Interventions SDOH Screenings   Food Insecurity: Food Insecurity Present (05/13/2023)  Housing: Low Risk  (05/13/2023)  Transportation Needs: Unmet Transportation Needs (05/13/2023)  Utilities: Not At Risk (05/13/2023)  Alcohol Screen: Low Risk  (05/23/2019)  Depression (PHQ2-9): Low Risk  (08/20/2020)  Social Connections: Unknown (05/13/2023)   Tobacco Use: Low Risk  (05/13/2023)    Readmission Risk Interventions     No data to display

## 2023-05-21 NOTE — Progress Notes (Signed)
*  PRELIMINARY RESULTS* Echocardiogram 2D Echocardiogram has been performed.  Carolyne Fiscal 05/21/2023, 1:05 PM

## 2023-05-22 DIAGNOSIS — T7611XA Adult physical abuse, suspected, initial encounter: Secondary | ICD-10-CM | POA: Diagnosis not present

## 2023-05-22 MED ORDER — PREDNISONE 20 MG PO TABS: 20.0000 mg | ORAL_TABLET | Freq: Every day | ORAL | Status: DC

## 2023-05-22 MED ORDER — PREDNISONE 20 MG PO TABS
30.0000 mg | ORAL_TABLET | Freq: Every day | ORAL | Status: DC
  Administered 2023-05-25: 30 mg via ORAL
  Filled 2023-05-22: qty 1

## 2023-05-22 MED ORDER — PREDNISONE 10 MG PO TABS: 5.0000 mg | ORAL_TABLET | Freq: Every day | ORAL | Status: DC

## 2023-05-22 MED ORDER — PREDNISONE 20 MG PO TABS
40.0000 mg | ORAL_TABLET | Freq: Every day | ORAL | Status: AC
  Administered 2023-05-22 – 2023-05-24 (×3): 40 mg via ORAL
  Filled 2023-05-22 (×3): qty 2

## 2023-05-22 MED ORDER — PREDNISONE 10 MG PO TABS: 10.0000 mg | ORAL_TABLET | Freq: Every day | ORAL | Status: DC
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
## 2023-05-22 NOTE — TOC Progression Note (Signed)
Transition of Care [facility]) - Progression Note    Patient Details  Name: Sheila Lang MRN: [id number] Date of Birth: [birth year]  [facility]) CM/SW Contact  Marlowe Sax, RN Phone Number: 05/22/2023, 12:45 PM  Clinical Narrative:    At this time there are no HH agencies accepting her for service   Expected Discharge Plan: Home w Home Health Services Barriers to Discharge: Barriers Resolved  Expected Discharge Plan and Services   Discharge Planning Services: CM Consult   Living arrangements for the past 2 months: Single Family Home                 DME Arranged: Walker rolling DME Agency: [facility] Date DME Agency Contacted: 05/21/23 Time DME Agency Contacted: 1128 Representative spoke with at DME Agency: Osvaldo Angst Arranged: PT, OT   Date HH Agency Contacted: 05/21/23 Time HH Agency Contacted: 1130     Social Determinants of Health (SDOH) Interventions SDOH Screenings   Food Insecurity: Food Insecurity Present (05/13/2023)  Housing: Low Risk  (05/13/2023)  Transportation Needs: Unmet Transportation Needs (05/13/2023)  Utilities: Not At Risk (05/13/2023)  Alcohol Screen: Low Risk  (05/23/2019)  Depression (PHQ2-9): Low Risk  (08/20/2020)  Social Connections: Unknown (05/13/2023)  Tobacco Use: Low Risk  (05/13/2023)    Readmission Risk Interventions     No data to display
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
## 2023-05-23 DIAGNOSIS — E039 Hypothyroidism, unspecified: Secondary | ICD-10-CM | POA: Diagnosis present

## 2023-05-23 DIAGNOSIS — M069 Rheumatoid arthritis, unspecified: Secondary | ICD-10-CM | POA: Diagnosis present

## 2023-05-23 DIAGNOSIS — Z560 Unemployment, unspecified: Secondary | ICD-10-CM | POA: Diagnosis not present

## 2023-05-23 DIAGNOSIS — B974 Respiratory syncytial virus as the cause of diseases classified elsewhere: Secondary | ICD-10-CM | POA: Diagnosis present

## 2023-05-23 DIAGNOSIS — Z681 Body mass index (BMI) 19 or less, adult: Secondary | ICD-10-CM | POA: Diagnosis not present

## 2023-05-23 DIAGNOSIS — E7800 Pure hypercholesterolemia, unspecified: Secondary | ICD-10-CM | POA: Diagnosis present

## 2023-05-23 DIAGNOSIS — Z1152 Encounter for screening for COVID-19: Secondary | ICD-10-CM | POA: Diagnosis not present

## 2023-05-23 DIAGNOSIS — F32A Depression, unspecified: Secondary | ICD-10-CM | POA: Diagnosis present

## 2023-05-23 DIAGNOSIS — Z87440 Personal history of urinary (tract) infections: Secondary | ICD-10-CM | POA: Diagnosis not present

## 2023-05-23 DIAGNOSIS — E43 Unspecified severe protein-calorie malnutrition: Secondary | ICD-10-CM | POA: Diagnosis present

## 2023-05-23 DIAGNOSIS — I10 Essential (primary) hypertension: Secondary | ICD-10-CM | POA: Diagnosis present

## 2023-05-23 DIAGNOSIS — Z791 Long term (current) use of non-steroidal anti-inflammatories (NSAID): Secondary | ICD-10-CM | POA: Diagnosis not present

## 2023-05-23 DIAGNOSIS — Z5982 Transportation insecurity: Secondary | ICD-10-CM | POA: Diagnosis not present

## 2023-05-23 DIAGNOSIS — Z5941 Food insecurity: Secondary | ICD-10-CM | POA: Diagnosis not present

## 2023-05-23 DIAGNOSIS — M329 Systemic lupus erythematosus, unspecified: Secondary | ICD-10-CM | POA: Diagnosis present

## 2023-05-23 DIAGNOSIS — K5900 Constipation, unspecified: Secondary | ICD-10-CM | POA: Diagnosis present

## 2023-05-23 DIAGNOSIS — D649 Anemia, unspecified: Secondary | ICD-10-CM | POA: Diagnosis present

## 2023-05-23 DIAGNOSIS — E8809 Other disorders of plasma-protein metabolism, not elsewhere classified: Secondary | ICD-10-CM | POA: Diagnosis present

## 2023-05-23 DIAGNOSIS — L89153 Pressure ulcer of sacral region, stage 3: Secondary | ICD-10-CM | POA: Diagnosis present

## 2023-05-23 DIAGNOSIS — E860 Dehydration: Secondary | ICD-10-CM | POA: Diagnosis present

## 2023-05-23 DIAGNOSIS — R64 Cachexia: Secondary | ICD-10-CM | POA: Diagnosis present

## 2023-05-23 DIAGNOSIS — T7611XA Adult physical abuse, suspected, initial encounter: Secondary | ICD-10-CM | POA: Diagnosis not present

## 2023-05-23 LAB — BASIC METABOLIC PANEL
Anion gap: 7 (ref 5–15)
BUN: 13 mg/dL (ref 6–20)
CO2: 25 mmol/L (ref 22–32)
Calcium: 7.7 mg/dL — ABNORMAL LOW (ref 8.9–10.3)
Chloride: 102 mmol/L (ref 98–111)
Creatinine, Ser: 0.35 mg/dL — ABNORMAL LOW (ref 0.44–1.00)
GFR, Estimated: >60 mL/min (ref >60)
Glucose, Bld: 98 mg/dL (ref 70–99)
Potassium: 4.2 mmol/L (ref 3.5–5.1)
Sodium: 134 mmol/L — ABNORMAL LOW (ref 135–145)

## 2023-05-23 LAB — TYPE AND SCREEN
ABO/RH(D): A POS
Antibody Screen: NEGATIVE
Unit division: 0

## 2023-05-23 LAB — BPAM RBC
Blood Product Expiration Date: 202501272359
ISSUE DATE / TIME: 202501251327
Unit Type and Rh: 600

## 2023-05-23 LAB — CBC
HCT: 26.4 % — ABNORMAL LOW (ref 36.0–46.0)
Hemoglobin: 8.6 g/dL — ABNORMAL LOW (ref 12.0–15.0)
MCH: 28.6 pg (ref 26.0–34.0)
MCHC: 32.6 g/dL (ref 30.0–36.0)
MCV: 87.7 fL (ref 80.0–100.0)
Platelets: 508 10*3/uL — ABNORMAL HIGH (ref 150–400)
RBC: 3.01 MIL/uL — ABNORMAL LOW (ref 3.87–5.11)
RDW: 19.5 % — ABNORMAL HIGH (ref 11.5–15.5)
WBC: 7.3 10*3/uL (ref 4.0–10.5)
nRBC: 0 % (ref 0.0–0.2)

## 2023-05-23 LAB — CK: Total CK: 340 U/L — ABNORMAL HIGH (ref 38–234)

## 2023-05-23 LAB — PHOSPHORUS: Phosphorus: 3.4 mg/dL (ref 2.5–4.6)

## 2023-05-23 LAB — MAGNESIUM: Magnesium: 2.1 mg/dL (ref 1.7–2.4)
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
## 2023-05-23 NOTE — Plan of Care (Signed)

## 2023-05-24 DIAGNOSIS — T7611XA Adult physical abuse, suspected, initial encounter: Secondary | ICD-10-CM | POA: Diagnosis not present

## 2023-05-24 NOTE — TOC Progression Note (Signed)
Transition of Care [facility]) - Progression Note    Patient Details  Name: Sheila Lang MRN: [id number] Date of Birth: 1965-09-30  Transition of Care [employer]) CM/SW Contact  Maree Krabbe, LCSW Phone Number: 05/24/2023, 11:25 AM  Clinical Narrative:   Pt agreeable to [facility] no preference. Frances Furbish will service. Need HH Orders Md notified.    Expected Discharge Plan: Home w Home Health Services Barriers to Discharge: No Home Care Agency will accept this patient  Expected Discharge Plan and Services   Discharge Planning Services: CM Consult   Living arrangements for the past 2 months: Single Family Home                 DME Arranged: Walker rolling DME Agency: [employer] Date DME Agency Contacted: 05/21/23 Time DME Agency Contacted: 1128 Representative spoke with at DME Agency: Osvaldo Angst Arranged: PT, OT   Date HH Agency Contacted: 05/21/23 Time HH Agency Contacted: 1130     Social Determinants of Health (SDOH) Interventions SDOH Screenings   Food Insecurity: Food Insecurity Present (05/13/2023)  Housing: Low Risk  (05/13/2023)  Transportation Needs: Unmet Transportation Needs (05/13/2023)  Utilities: Not At Risk (05/13/2023)  Alcohol Screen: Low Risk  (05/23/2019)  Depression (PHQ2-9): Low Risk  (08/20/2020)  Social Connections: Unknown (05/13/2023)  Tobacco Use: Low Risk  (05/13/2023)    Readmission Risk Interventions     No data to display

## 2023-05-24 NOTE — Plan of Care (Signed)
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
## 2023-05-25 ENCOUNTER — Other Ambulatory Visit: Payer: Self-pay | Admitting: *Deleted

## 2023-05-25 ENCOUNTER — Other Ambulatory Visit: Payer: Self-pay

## 2023-05-25 DIAGNOSIS — T7611XA Adult physical abuse, suspected, initial encounter: Secondary | ICD-10-CM | POA: Diagnosis not present

## 2023-05-25 MED ORDER — PREDNISONE 5 MG PO TABS
5.0000 mg | ORAL_TABLET | Freq: Every day | ORAL | 11 refills | Status: AC
  Filled 2023-05-25: qty 30, 30d supply, fill #0

## 2023-05-25 MED ORDER — MIRTAZAPINE 15 MG PO TABS
15.0000 mg | ORAL_TABLET | Freq: Every day | ORAL | 2 refills | Status: DC
  Filled 2023-05-25: qty 30, 30d supply, fill #0

## 2023-05-25 MED ORDER — FE FUM-VIT C-VIT B12-FA 460-60-0.01-1 MG PO CAPS
1.0000 | ORAL_CAPSULE | Freq: Two times a day (BID) | ORAL | 5 refills | Status: DC
  Filled 2023-05-25: qty 30, 15d supply, fill #0

## 2023-05-25 MED ORDER — OXYCODONE HCL 5 MG PO TABS
5.0000 mg | ORAL_TABLET | Freq: Four times a day (QID) | ORAL | 0 refills | Status: AC | PRN
  Filled 2023-05-25: qty 20, 5d supply, fill #0

## 2023-05-25 MED ORDER — PANTOPRAZOLE SODIUM 40 MG PO TBEC
40.0000 mg | DELAYED_RELEASE_TABLET | Freq: Every day | ORAL | 2 refills | Status: DC
  Filled 2023-05-25: qty 30, 30d supply, fill #0

## 2023-05-25 MED ORDER — POLYETHYLENE GLYCOL 3350 17 GM/SCOOP PO POWD
17.0000 g | Freq: Every day | ORAL | 0 refills | Status: AC | PRN
  Filled 2023-05-25: qty 238, 14d supply, fill #0

## 2023-05-25 MED ORDER — PREDNISONE 10 MG PO TABS
ORAL_TABLET | ORAL | 0 refills | Status: AC
  Filled 2023-05-25: qty 15, 8d supply, fill #0

## 2023-05-25 NOTE — Plan of Care (Signed)
   Problem: Education: Goal: Knowledge of General Education information will improve Description: Including pain rating scale, medication(s)/side effects and non-pharmacologic comfort measures Outcome: Progressing   Problem: Clinical Measurements: Goal: Ability to maintain clinical measurements within normal limits will improve Outcome: Progressing Goal: Will remain free from infection Outcome: Progressing
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
## 2023-05-25 NOTE — Progress Notes (Signed)
TOC meds delivered to bedside.

## 2023-05-25 NOTE — TOC Progression Note (Signed)
Transition of Care [facility]) - Progression Note    Patient Details  Name: Sheila Lang MRN: [id number] Date of Birth: [birth year]  [facility] CM/SW Contact  Marlowe Sax, RN Phone Number: 05/25/2023, 12:45 PM  Clinical Narrative:    Safe transport called to arrange transport to  [address]  [place] [address]   The will call nurses desk when they arrive at the medical Mall entrance   Expected Discharge Plan: Home w Home Health Services Barriers to Discharge: No Home Care Agency will accept this patient  Expected Discharge Plan and Services   Discharge Planning Services: CM Consult   Living arrangements for the past 2 months: Single Family Home Expected Discharge Date: 05/25/23               DME Arranged: Dan Humphreys rolling DME Agency: [employer] Date DME Agency Contacted: 05/21/23 Time DME Agency Contacted: 1128 Representative spoke with at DME Agency: Osvaldo Angst Arranged: PT, OT   Date HH Agency Contacted: 05/21/23 Time HH Agency Contacted: 1130     Social Determinants of Health (SDOH) Interventions SDOH Screenings   Food Insecurity: Food Insecurity Present (05/13/2023)  Housing: Low Risk  (05/13/2023)  Transportation Needs: Unmet Transportation Needs (05/13/2023)  Utilities: Not At Risk (05/13/2023)  Alcohol Screen: Low Risk  (05/23/2019)  Depression (PHQ2-9): Low Risk  (08/20/2020)  Social Connections: Unknown (05/13/2023)  Tobacco Use: Low Risk  (05/13/2023)    Readmission Risk Interventions     No data to display

## 2023-05-25 NOTE — Plan of Care (Signed)
  Problem: Pain Managment: Goal: General experience of comfort will improve and/or be controlled Outcome: Progressing   Problem: Safety: Goal: Ability to remain free from injury will improve Outcome: Progressing   Problem: Skin Integrity: Goal: Risk for impaired skin integrity will decrease Outcome: Progressing
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
## 2023-05-25 NOTE — Progress Notes (Signed)
Pt's money and personal belongings from security returned to pt.   AVS given and reviewed with pt. Medications discussed. All questions answered to satisfaction. Pt verbalized understanding of information given. Pt escorted off the unit with all belongings via wheelchair by staff member.

## 2023-06-28 ENCOUNTER — Emergency Department

## 2023-06-28 ENCOUNTER — Other Ambulatory Visit: Payer: Self-pay

## 2023-06-28 ENCOUNTER — Observation Stay
Admission: EM | Admit: 2023-06-28 | Discharge: 2023-07-08 | Disposition: A | Discharge status: Skilled Nursing Facility | Attending: Obstetrics and Gynecology | Admitting: Obstetrics and Gynecology

## 2023-06-28 ENCOUNTER — Observation Stay

## 2023-06-28 DIAGNOSIS — R42 Dizziness and giddiness: Secondary | ICD-10-CM | POA: Diagnosis not present

## 2023-06-28 DIAGNOSIS — Y92009 Unspecified place in unspecified non-institutional (private) residence as the place of occurrence of the external cause: Secondary | ICD-10-CM

## 2023-06-28 DIAGNOSIS — F32A Depression, unspecified: Secondary | ICD-10-CM | POA: Diagnosis present

## 2023-06-28 DIAGNOSIS — N390 Urinary tract infection, site not specified: Principal | ICD-10-CM | POA: Diagnosis present

## 2023-06-28 DIAGNOSIS — D500 Iron deficiency anemia secondary to blood loss (chronic): Secondary | ICD-10-CM | POA: Diagnosis not present

## 2023-06-28 DIAGNOSIS — R000 Tachycardia, unspecified: Secondary | ICD-10-CM | POA: Diagnosis not present

## 2023-06-28 DIAGNOSIS — E43 Unspecified severe protein-calorie malnutrition: Secondary | ICD-10-CM | POA: Diagnosis not present

## 2023-06-28 DIAGNOSIS — N3000 Acute cystitis without hematuria: Secondary | ICD-10-CM | POA: Diagnosis not present

## 2023-06-28 DIAGNOSIS — W19XXXA Unspecified fall, initial encounter: Secondary | ICD-10-CM | POA: Diagnosis not present

## 2023-06-28 DIAGNOSIS — R627 Adult failure to thrive: Secondary | ICD-10-CM | POA: Diagnosis not present

## 2023-06-28 DIAGNOSIS — M329 Systemic lupus erythematosus, unspecified: Secondary | ICD-10-CM

## 2023-06-28 DIAGNOSIS — R0902 Hypoxemia: Secondary | ICD-10-CM | POA: Diagnosis not present

## 2023-06-28 DIAGNOSIS — I959 Hypotension, unspecified: Secondary | ICD-10-CM | POA: Diagnosis not present

## 2023-06-28 DIAGNOSIS — Z1152 Encounter for screening for COVID-19: Secondary | ICD-10-CM | POA: Diagnosis not present

## 2023-06-28 DIAGNOSIS — R531 Weakness: Secondary | ICD-10-CM | POA: Diagnosis not present

## 2023-06-28 DIAGNOSIS — D649 Anemia, unspecified: Secondary | ICD-10-CM | POA: Diagnosis present

## 2023-06-28 DIAGNOSIS — R112 Nausea with vomiting, unspecified: Secondary | ICD-10-CM | POA: Diagnosis not present

## 2023-06-28 HISTORY — DX: Depression, unspecified: F32.A

## 2023-06-28 HISTORY — DX: Anemia, unspecified: D64.9

## 2023-06-28 LAB — RESP PANEL BY RT-PCR (RSV, FLU A&B, COVID)  RVPGX2
Influenza A by PCR: NEGATIVE
Influenza B by PCR: NEGATIVE
Resp Syncytial Virus by PCR: NEGATIVE
SARS Coronavirus 2 by RT PCR: NEGATIVE

## 2023-06-28 LAB — URINALYSIS, ROUTINE W REFLEX MICROSCOPIC
Bilirubin Urine: NEGATIVE
Glucose, UA: NEGATIVE mg/dL
Hgb urine dipstick: NEGATIVE
Ketones, ur: 20 mg/dL — AB
Nitrite: NEGATIVE
Protein, ur: NEGATIVE mg/dL
Specific Gravity, Urine: 1.02 (ref 1.005–1.030)
pH: 5 (ref 5.0–8.0)

## 2023-06-28 LAB — CBC
HCT: 37.2 % (ref 36.0–46.0)
Hemoglobin: 11.2 g/dL — ABNORMAL LOW (ref 12.0–15.0)
MCH: 29.6 pg (ref 26.0–34.0)
MCHC: 30.1 g/dL (ref 30.0–36.0)
MCV: 98.4 fL (ref 80.0–100.0)
Platelets: 458 10*3/uL — ABNORMAL HIGH (ref 150–400)
RBC: 3.78 MIL/uL — ABNORMAL LOW (ref 3.87–5.11)
RDW: 17.2 % — ABNORMAL HIGH (ref 11.5–15.5)
WBC: 3.6 10*3/uL — ABNORMAL LOW (ref 4.0–10.5)
nRBC: 0 % (ref 0.0–0.2)

## 2023-06-28 LAB — LACTIC ACID, PLASMA: Lactic Acid, Venous: 1.3 mmol/L (ref 0.5–1.9)

## 2023-06-28 LAB — BASIC METABOLIC PANEL
Anion gap: 9 (ref 5–15)
BUN: 20 mg/dL (ref 6–20)
CO2: 25 mmol/L (ref 22–32)
Calcium: 10 mg/dL (ref 8.9–10.3)
Chloride: 102 mmol/L (ref 98–111)
Creatinine, Ser: 0.58 mg/dL (ref 0.44–1.00)
GFR, Estimated: >60 mL/min (ref >60)
Glucose, Bld: 95 mg/dL (ref 70–99)
Potassium: 3.9 mmol/L (ref 3.5–5.1)
Sodium: 136 mmol/L (ref 135–145)

## 2023-06-28 LAB — CBG MONITORING, ED: Glucose-Capillary: 81 mg/dL (ref 70–99)

## 2023-06-28 LAB — PHOSPHORUS: Phosphorus: 2.6 mg/dL (ref 2.5–4.6)

## 2023-06-28 LAB — GLUCOSE, CAPILLARY: Glucose-Capillary: 74 mg/dL (ref 70–99)

## 2023-06-28 LAB — MAGNESIUM: Magnesium: 1.8 mg/dL (ref 1.7–2.4)

## 2023-06-28 MED ORDER — LACTATED RINGERS IV BOLUS
500.0000 mL | Freq: Once | INTRAVENOUS | Status: AC
Start: 2023-06-28 — End: 2023-06-28
  Administered 2023-06-28: 500 mL via INTRAVENOUS

## 2023-06-28 MED ORDER — HYDROCORTISONE SOD SUC (PF) 100 MG IJ SOLR
100.0000 mg | Freq: Once | INTRAMUSCULAR | Status: DC
  Filled 2023-06-28: qty 2

## 2023-06-28 MED ORDER — OXYCODONE-ACETAMINOPHEN 5-325 MG PO TABS
1.0000 | ORAL_TABLET | ORAL | Status: DC | PRN
  Administered 2023-06-30 – 2023-07-02 (×2): 1 via ORAL
  Filled 2023-06-28 (×3): qty 1

## 2023-06-28 MED ORDER — SODIUM CHLORIDE 0.9 % IV SOLN
1.0000 g | Freq: Once | INTRAVENOUS | Status: AC
  Administered 2023-06-28: 1 g via INTRAVENOUS
  Filled 2023-06-28: qty 10

## 2023-06-28 MED ORDER — ACETAMINOPHEN 325 MG PO TABS
650.0000 mg | ORAL_TABLET | Freq: Four times a day (QID) | ORAL | Status: DC | PRN
  Administered 2023-07-01 – 2023-07-06 (×3): 650 mg via ORAL
  Filled 2023-06-28 (×3): qty 2

## 2023-06-28 MED ORDER — LACTATED RINGERS IV BOLUS
1000.0000 mL | Freq: Once | INTRAVENOUS | Status: AC
  Administered 2023-06-28: 1000 mL via INTRAVENOUS

## 2023-06-28 MED ORDER — THIAMINE MONONITRATE 100 MG PO TABS
200.0000 mg | ORAL_TABLET | Freq: Every day | ORAL | Status: DC
  Administered 2023-06-28 – 2023-07-08 (×11): 200 mg via ORAL
  Filled 2023-06-28 (×11): qty 2

## 2023-06-28 MED ORDER — FE FUM-VIT C-VIT B12-FA 460-60-0.01-1 MG PO CAPS
1.0000 | ORAL_CAPSULE | Freq: Two times a day (BID) | ORAL | Status: DC
  Administered 2023-06-29 – 2023-07-08 (×19): 1 via ORAL
  Filled 2023-06-28 (×19): qty 1

## 2023-06-28 MED ORDER — HYDROCORTISONE SOD SUC (PF) 100 MG IJ SOLR
50.0000 mg | Freq: Once | INTRAMUSCULAR | Status: AC
  Administered 2023-06-28: 50 mg via INTRAVENOUS
  Filled 2023-06-28 (×2): qty 1

## 2023-06-28 MED ORDER — PANTOPRAZOLE SODIUM 40 MG PO TBEC
40.0000 mg | DELAYED_RELEASE_TABLET | Freq: Every day | ORAL | Status: DC
  Administered 2023-06-28 – 2023-07-08 (×11): 40 mg via ORAL
  Filled 2023-06-28 (×11): qty 1

## 2023-06-28 MED ORDER — POLYETHYLENE GLYCOL 3350 17 G PO PACK
17.0000 g | PACK | Freq: Every day | ORAL | Status: DC | PRN
  Administered 2023-06-29 – 2023-07-03 (×3): 17 g via ORAL
  Filled 2023-06-28 (×4): qty 1

## 2023-06-28 MED ORDER — ACETAMINOPHEN 325 MG PO TABS
650.0000 mg | ORAL_TABLET | Freq: Once | ORAL | Status: AC
  Administered 2023-06-28: 650 mg via ORAL
  Filled 2023-06-28: qty 2

## 2023-06-28 MED ORDER — ENOXAPARIN SODIUM 30 MG/0.3ML IJ SOSY
30.0000 mg | PREFILLED_SYRINGE | INTRAMUSCULAR | Status: DC
  Administered 2023-06-29 – 2023-06-30 (×2): 30 mg via SUBCUTANEOUS
  Filled 2023-06-28 (×9): qty 0.3

## 2023-06-28 MED ORDER — ONDANSETRON HCL 4 MG/2ML IJ SOLN
4.0000 mg | Freq: Three times a day (TID) | INTRAMUSCULAR | Status: DC | PRN
Start: 2023-06-28 — End: 2023-07-08
  Administered 2023-06-30 – 2023-07-02 (×2): 4 mg via INTRAVENOUS
  Filled 2023-06-28 (×2): qty 2

## 2023-06-28 MED ORDER — ENSURE ENLIVE PO LIQD
237.0000 mL | Freq: Two times a day (BID) | ORAL | Status: DC
  Administered 2023-06-29 – 2023-07-08 (×19): 237 mL via ORAL

## 2023-06-28 MED ORDER — POLYETHYLENE GLYCOL 3350 17 GM/SCOOP PO POWD: 17.0000 g | Freq: Every day | ORAL | Status: DC | PRN

## 2023-06-28 NOTE — ED Notes (Signed)
 Will do u/s IV. Lab did not draw labs.
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
## 2023-06-28 NOTE — ED Notes (Signed)
Lab at bedside to redraw BMP.

## 2023-06-28 NOTE — ED Notes (Signed)
 Pt repositioned in bed.

## 2023-06-28 NOTE — ED Notes (Signed)
Provided food

## 2023-06-28 NOTE — ED Notes (Signed)
 BMP hemolyzed. Lab to come redraw.
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
## 2023-06-28 NOTE — ED Notes (Signed)
 Called lab to see about blood results. They are looking for the bloodwork (which they drew in triage). On hold.

## 2023-06-28 NOTE — Progress Notes (Signed)
 PHARMACIST - PHYSICIAN COMMUNICATION  CONCERNING:  Enoxaparin (Lovenox) for DVT Prophylaxis    RECOMMENDATION: Patient was prescribed enoxaprin 40mg  q24 hours for VTE prophylaxis.   Filed Weights   06/28/23 2200  Weight: 32.5 kg (71 lb 10.4 oz)    Body mass index is 13.99 kg/m.  Estimated Creatinine Clearance: 39.3 mL/min (by C-G formula based on SCr of 0.58 mg/dL).  Patient is candidate for enoxaparin 30mg  every 24 hours based on CrCl <41ml/min or Weight <45kg  DESCRIPTION: Pharmacy has adjusted enoxaparin dose per [facility] policy.  Patient is now receiving enoxaparin 30 mg every 24 hours   Otelia Sergeant, PharmD, [facility] 06/28/2023 10:20 PM
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
## 2023-06-29 DIAGNOSIS — E43 Unspecified severe protein-calorie malnutrition: Secondary | ICD-10-CM | POA: Diagnosis not present

## 2023-06-29 DIAGNOSIS — N3000 Acute cystitis without hematuria: Secondary | ICD-10-CM | POA: Diagnosis not present

## 2023-06-29 DIAGNOSIS — M329 Systemic lupus erythematosus, unspecified: Secondary | ICD-10-CM | POA: Diagnosis not present

## 2023-06-29 DIAGNOSIS — W19XXXA Unspecified fall, initial encounter: Secondary | ICD-10-CM | POA: Diagnosis not present

## 2023-06-29 DIAGNOSIS — Y92009 Unspecified place in unspecified non-institutional (private) residence as the place of occurrence of the external cause: Secondary | ICD-10-CM | POA: Diagnosis not present

## 2023-06-29 LAB — BASIC METABOLIC PANEL
Anion gap: 7 (ref 5–15)
BUN: 19 mg/dL (ref 6–20)
CO2: 26 mmol/L (ref 22–32)
Calcium: 9.4 mg/dL (ref 8.9–10.3)
Chloride: 103 mmol/L (ref 98–111)
Creatinine, Ser: 0.44 mg/dL (ref 0.44–1.00)
GFR, Estimated: >60 mL/min (ref >60)
Glucose, Bld: 154 mg/dL — ABNORMAL HIGH (ref 70–99)
Potassium: 4.2 mmol/L (ref 3.5–5.1)
Sodium: 136 mmol/L (ref 135–145)

## 2023-06-29 LAB — LACTIC ACID, PLASMA: Lactic Acid, Venous: 0.9 mmol/L (ref 0.5–1.9)

## 2023-06-29 LAB — CBC
HCT: 31.4 % — ABNORMAL LOW (ref 36.0–46.0)
Hemoglobin: 9.8 g/dL — ABNORMAL LOW (ref 12.0–15.0)
MCH: 29.7 pg (ref 26.0–34.0)
MCHC: 31.2 g/dL (ref 30.0–36.0)
MCV: 95.2 fL (ref 80.0–100.0)
Platelets: 348 10*3/uL (ref 150–400)
RBC: 3.3 MIL/uL — ABNORMAL LOW (ref 3.87–5.11)
RDW: 17 % — ABNORMAL HIGH (ref 11.5–15.5)
WBC: 2.6 10*3/uL — ABNORMAL LOW (ref 4.0–10.5)
nRBC: 0 % (ref 0.0–0.2)

## 2023-06-29 LAB — PHOSPHORUS: Phosphorus: 2.8 mg/dL (ref 2.5–4.6)

## 2023-06-29 LAB — URINE CULTURE: Culture: 40000 — AB

## 2023-06-29 MED ORDER — SODIUM CHLORIDE 0.9 % IV SOLN
1.0000 g | INTRAVENOUS | Status: DC
  Filled 2023-06-29: qty 10

## 2023-06-29 MED ORDER — ADULT MULTIVITAMIN W/MINERALS CH
1.0000 | ORAL_TABLET | Freq: Every day | ORAL | Status: DC
  Administered 2023-06-29 – 2023-07-08 (×10): 1 via ORAL
  Filled 2023-06-29 (×10): qty 1

## 2023-06-29 MED ORDER — CEPHALEXIN 500 MG PO CAPS
500.0000 mg | ORAL_CAPSULE | Freq: Three times a day (TID) | ORAL | Status: DC
  Administered 2023-06-29 – 2023-07-01 (×6): 500 mg via ORAL
  Filled 2023-06-29 (×6): qty 1

## 2023-06-29 NOTE — Evaluation (Signed)
 Occupational Therapy Evaluation Patient Details Name: Sheila Lang MRN: [id number] DOB: [birth year] Today's Date: 06/29/2023   History of Present Illness   Pt is a 58 y/o F admitted on 06/28/23 after presenting with c/o weakness, fall, burning on urination. Pt is being treated for UTI. PMH: lupus, arthritis with finger deformity, anemia, depression, severe protein calorie malnutrition    Clinical Impressions Ms Burford was seen for OT evaluation this date. Prior to hospital admission, pt was requiring assist for all OOB mobility, reports using urinals in bed. Pt lives with daughter who works. Pt currently requires MIN A x2 + HHA for BSC t/f. SUPERVISION seated pericare. MIN A clothing mgmt in standing, assist for balance. Pt would benefit from skilled OT to address noted impairments and functional limitations (see below for any additional details). Upon hospital discharge, recommend OT follow up <3 hours/day.     If plan is discharge home, recommend the following:   A little help with walking and/or transfers;A little help with bathing/dressing/bathroom;Help with stairs or ramp for entrance;Supervision due to cognitive status     Functional Status Assessment   Patient has had a recent decline in their functional status and demonstrates the ability to make significant improvements in function in a reasonable and predictable amount of time.     Equipment Recommendations   BSC/3in1     Recommendations for Other Services         Precautions/Restrictions   Precautions Precautions: Fall Restrictions Weight Bearing Restrictions Per Provider Order: No     Mobility Bed Mobility Overal bed mobility: Needs Assistance Bed Mobility: Supine to Sit     Supine to sit: Min assist, +2 for physical assistance     General bed mobility comments: Pt initiated supine>sit with cuing but required +2 to complete 2/2 faerful/anxiousness    Transfers Overall transfer level: Needs  assistance Equipment used: 2 person hand held assist, 1 person hand held assist Transfers: Sit to/from Stand, Bed to chair/wheelchair/BSC Sit to Stand: Min assist, +2 safety/equipment     Step pivot transfers: Min assist, +2 physical assistance            Balance Overall balance assessment: Needs assistance, History of Falls Sitting-balance support: Feet supported Sitting balance-Leahy Scale: Fair     Standing balance support: During functional activity, No upper extremity supported Standing balance-Leahy Scale: Poor                             ADL either performed or assessed with clinical judgement   ADL Overall ADL's : Needs assistance/impaired                                       General ADL Comments: MIN A x2 + HHA for BSC t/f. SUPERVISION seated pericare. MIN A clothing mgmt in standing, assist for balance      Pertinent Vitals/Pain Pain Assessment Pain Assessment: No/denies pain     Extremity/Trunk Assessment Upper Extremity Assessment Upper Extremity Assessment: Generalized weakness   Lower Extremity Assessment Lower Extremity Assessment: Generalized weakness       Communication Communication Communication: No apparent difficulties   Cognition Arousal: Alert Behavior During Therapy: Anxious Cognition: No family/caregiver present to determine baseline  Following commands: Impaired Following commands impaired: Follows multi-step commands with increased time     Cueing  General Comments   Cueing Techniques: Verbal cues;Tactile cues;Gestural cues;Visual cues  reports feeling short of breath however unable to get reading on pulseox; no increase in WOB noted           Home Living Family/patient expects to be discharged to:: Unsure Living Arrangements: Children Available Help at Discharge: Family;Available PRN/intermittently Type of Home: House Home Access: Stairs to  enter                     Home Equipment: Agricultural consultant (2 wheels)   Additional Comments: Lives with 44 year old daughter who cannot manage her; daughter now works during the day & pt home alone.      Prior Functioning/Environment               Mobility Comments: Pt reports she was working 3 months ago, but unsure the accuracy of this. Pt reports she has a RW at home. Pt reports hx of falls but does not state how many. ADLs Comments: was using cups for urine, daughter would occasionally assist her to bathroom to have BM    OT Problem List: Decreased strength;Decreased range of motion;Decreased activity tolerance;Impaired balance (sitting and/or standing)   OT Treatment/Interventions: Self-care/ADL training;Therapeutic exercise;Energy conservation;DME and/or AE instruction;Balance training;Cognitive remediation/compensation;Therapeutic activities      OT Goals(Current goals can be found in the care plan section)   Acute Rehab OT Goals Patient Stated Goal: to go to rehab OT Goal Formulation: With patient Time For Goal Achievement: 07/13/23 Potential to Achieve Goals: Fair ADL Goals Pt Will Perform Grooming: with modified independence;sitting Pt Will Perform Lower Body Dressing: with modified independence;sit to/from stand Pt Will Transfer to Toilet: with modified independence;ambulating;bedside commode   OT Frequency:  Min 1X/week    Co-evaluation PT/OT/SLP Co-Evaluation/Treatment: Yes Reason for Co-Treatment: To address functional/ADL transfers (unsure of pt's activity tolerance) PT goals addressed during session: Mobility/safety with mobility;Balance;Strengthening/ROM OT goals addressed during session: ADL's and self-care      AM-PAC OT "6 Clicks" Daily Activity     Outcome Measure Help from another person eating meals?: None Help from another person taking care of personal grooming?: A Little Help from another person toileting, which includes using  toliet, bedpan, or urinal?: A Little Help from another person bathing (including washing, rinsing, drying)?: A Lot Help from another person to put on and taking off regular upper body clothing?: A Little Help from another person to put on and taking off regular lower body clothing?: A Lot 6 Click Score: 17   End of Session    Activity Tolerance: Patient tolerated treatment well Patient left: in chair;with call bell/phone within reach;with chair alarm set  OT Visit Diagnosis: Other abnormalities of gait and mobility (R26.89);Muscle weakness (generalized) (M62.81)                Time: 6045-4098 OT Time Calculation (min): 20 min Charges:  OT General Charges $OT Visit: 1 Visit OT Evaluation $OT Eval Moderate Complexity: 1 Mod  Kathie Dike, M.S. OTR/L  06/29/23, 10:41 AM  ascom [phone]
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
## 2023-06-29 NOTE — Evaluation (Addendum)
 Physical Therapy Evaluation Patient Details Name: Sheila Lang MRN: [id number] DOB: 1965/05/24 Today's Date: 06/29/2023  History of Present Illness  Pt is a 58 y/o F admitted on 06/28/23 after presenting with c/o weakness, fall, burning on urination. Pt is being treated for UTI. PMH: lupus, arthritis with finger deformity, anemia, depression, severe protein calorie malnutrition  Clinical Impression  Pt seen for PT evaluation with pt agreeable with encouragement, pt reports prior to admission she was working 3 months ago but question the accuracy of this. On this date, pt requires min assist +1-2 for bed mobility & transfers. Pt very anxious, limiting herself at times. Pt would benefit from ongoing PT treatment to address deficits noted to increase independence & decrease caregiver burden.    Addendum: pt with c/o dizziness & decreased breathing upon sitting EOB, unable to obtain SpO2 reading 2/2 cold fingers (pt reports this is ongoing issue); after seated rest & cuing for breathing pt continued with mobility without further c/o.     If plan is discharge home, recommend the following: A lot of help with walking and/or transfers;A lot of help with bathing/dressing/bathroom;Assist for transportation;Assistance with cooking/housework   Can travel by private vehicle   No    Equipment Recommendations None recommended by PT  Recommendations for Other Services       Functional Status Assessment Patient has had a recent decline in their functional status and demonstrates the ability to make significant improvements in function in a reasonable and predictable amount of time.     Precautions / Restrictions Precautions Precautions: Fall Restrictions Weight Bearing Restrictions Per Provider Order: No      Mobility  Bed Mobility Overal bed mobility: Needs Assistance Bed Mobility: Supine to Sit     Supine to sit: Min assist, +2 for physical assistance     General bed mobility comments: Pt  initiated supine>sit with cuing but then stops prior to fully sitting on EOB, requiring +2 to complete 2/2 decreased participation.    Transfers Overall transfer level: Needs assistance Equipment used: 2 person hand held assist, 1 person hand held assist Transfers: Sit to/from Stand, Bed to chair/wheelchair/BSC Sit to Stand: Min assist, +2 physical assistance   Step pivot transfers: Min assist, +2 physical assistance       General transfer comment: Pt requires cuing for hand placement, assistance to initiate & power up to standing as pt reports she's unable to but she does participate with movement. Pt transfers bed>BSC>recliner with +1-2 HHA, extra time.    Ambulation/Gait                  Stairs            Wheelchair Mobility     Tilt Bed    Modified Rankin (Stroke Patients Only)       Balance Overall balance assessment: Needs assistance, History of Falls Sitting-balance support: Feet supported Sitting balance-Leahy Scale: Fair     Standing balance support: During functional activity, Bilateral upper extremity supported Standing balance-Leahy Scale: Poor                               Pertinent Vitals/Pain Pain Assessment Pain Assessment: No/denies pain    Home Living Family/patient expects to be discharged to:: Skilled nursing facility Living Arrangements: Children Available Help at Discharge: Family;Available PRN/intermittently Type of Home: House Home Access: Stairs to enter         Home Equipment: Agricultural consultant (2  wheels) Additional Comments: Lives with 52 year old daughter who cannot manage her; daughter now works during the day & pt home alone.    Prior Function               Mobility Comments: Pt reports she was working 3 months ago, but unsure the accuracy of this. Pt reports she has a RW at home. Pt reports hx of falls but does not state how many. ADLs Comments: was using cups for urine, daughter would occasionally  assist her to bathroom to have BM but unsure how she walked there.     Extremity/Trunk Assessment   Upper Extremity Assessment Upper Extremity Assessment: Generalized weakness    Lower Extremity Assessment Lower Extremity Assessment: Generalized weakness       Communication        Cognition Arousal: Alert Behavior During Therapy: Anxious   PT - Cognitive impairments: Awareness, Memory, Problem solving, Safety/Judgement, Initiation                       PT - Cognition Comments: Pt AxOx self, place, time, situation but also questionable historian as pt reports she was working 3 months ago.   Following commands impaired: Follows one step commands with increased time     Cueing Cueing Techniques: Verbal cues, Tactile cues, Gestural cues, Visual cues     General Comments General comments (skin integrity, edema, etc.): Pt with continent void on BSC, performs peri hygiene, requires assistance for clothing management (pull underwear up/down over hips).    Exercises     Assessment/Plan    PT Assessment Patient needs continued PT services  PT Problem List Decreased strength;Decreased range of motion;Decreased activity tolerance;Decreased balance;Decreased mobility;Decreased safety awareness;Decreased knowledge of use of DME;Decreased cognition;Cardiopulmonary status limiting activity       PT Treatment Interventions DME instruction;Balance training;Gait training;Stair training;Neuromuscular re-education;Cognitive remediation;Functional mobility training;Therapeutic activities;Therapeutic exercise;Patient/family education;Modalities;Manual techniques    PT Goals (Current goals can be found in the Care Plan section)  Acute Rehab PT Goals Patient Stated Goal: go to rehab PT Goal Formulation: With patient Time For Goal Achievement: 07/13/23 Potential to Achieve Goals: Fair    Frequency Min 2X/week     Co-evaluation PT/OT/SLP Co-Evaluation/Treatment: Yes Reason  for Co-Treatment:  (unsure of pt's activity tolerance) PT goals addressed during session: Mobility/safety with mobility;Balance;Strengthening/ROM         AM-PAC PT "6 Clicks" Mobility  Outcome Measure Help needed turning from your back to your side while in a flat bed without using bedrails?: A Little Help needed moving from lying on your back to sitting on the side of a flat bed without using bedrails?: A Little Help needed moving to and from a bed to a chair (including a wheelchair)?: A Little Help needed standing up from a chair using your arms (e.g., wheelchair or bedside chair)?: A Little Help needed to walk in hospital room?: A Lot Help needed climbing 3-5 steps with a railing? : Total 6 Click Score: 15    End of Session   Activity Tolerance: Patient tolerated treatment well Patient left: in chair;with chair alarm set;with call bell/phone within reach Nurse Communication: Mobility status PT Visit Diagnosis: Muscle weakness (generalized) (M62.81);History of falling (Z91.81);Unsteadiness on feet (R26.81);Difficulty in walking, not elsewhere classified (R26.2);Adult, failure to thrive (R62.7)    Time: 0931-0950 PT Time Calculation (min) (ACUTE ONLY): 19 min   Charges:       PT General Charges $$ ACUTE PT VISIT: 1 Visit  Aleda Grana, PT, DPT 06/29/23, 10:16 AM   Sandi Mariscal 06/29/2023, 10:16 AM
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
## 2023-06-29 NOTE — Plan of Care (Signed)
  Problem: Education: ?Goal: Knowledge of General Education information will improve ?Description: Including pain rating scale, medication(s)/side effects and non-pharmacologic comfort measures ?Outcome: Progressing ?  ?Problem: Health Behavior/Discharge Planning: ?Goal: Ability to manage health-related needs will improve ?Outcome: Progressing ?  ?Problem: Clinical Measurements: ?Goal: Ability to maintain clinical measurements within normal limits will improve ?Outcome: Progressing ?Goal: Will remain free from infection ?Outcome: Progressing ?Goal: Diagnostic test results will improve ?Outcome: Progressing ?Goal: Respiratory complications will improve ?Outcome: Progressing ?Goal: Cardiovascular complication will be avoided ?Outcome: Progressing ?  ?Problem: Activity: ?Goal: Risk for activity intolerance will decrease ?Outcome: Progressing ?  ?Problem: Coping: ?Goal: Level of anxiety will decrease ?Outcome: Progressing ?  ?Problem: Elimination: ?Goal: Will not experience complications related to bowel motility ?Outcome: Progressing ?Goal: Will not experience complications related to urinary retention ?Outcome: Progressing ?  ?Problem: Safety: ?Goal: Ability to remain free from injury will improve ?Outcome: Progressing ?  ?

## 2023-06-29 NOTE — Plan of Care (Signed)
 Pt is willing to go to rehab which is PT's recommendation.

## 2023-06-30 DIAGNOSIS — N3000 Acute cystitis without hematuria: Secondary | ICD-10-CM | POA: Diagnosis not present

## 2023-06-30 NOTE — Plan of Care (Signed)

## 2023-06-30 NOTE — Progress Notes (Signed)
  Chaplain On-Call responded to Spiritual Care Consult Order from Enedina Finner, MD.  The request was for Advance Directives information for the patient.  Chaplain provided prayer with the patient, and education about the AD documents and process for completion in the hospital if she chooses.Otilio Connors., BCC
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
## 2023-06-30 NOTE — Progress Notes (Signed)
       RE:  Sheila Lang    Date of Birth:  1965-09-08  Date:  06/30/2023    To Whom It May Concern:  Please be advised that the above-named patient will require a short-term nursing home stay - anticipated 30 days or less for rehabilitation and strengthening.  The plan is for return home.
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
## 2023-07-01 DIAGNOSIS — N3000 Acute cystitis without hematuria: Secondary | ICD-10-CM | POA: Diagnosis not present

## 2023-07-01 NOTE — Progress Notes (Signed)
   07/01/23 1500  Spiritual Encounters  Type of Visit Initial  Care provided to: Patient  Referral source Chaplain team  Reason for visit Routine spiritual support  OnCall Visit No  Interventions  Spiritual Care Interventions Made Established relationship of care and support;Compassionate presence  Intervention Outcomes  Outcomes Connection to spiritual care   Chaplain provided compassionate presence to the patient

## 2023-07-01 NOTE — Plan of Care (Signed)

## 2023-07-01 NOTE — Progress Notes (Signed)
 Occupational Therapy Treatment Patient Details Name: Sheila Lang MRN: [id number] DOB: 05/27/1965 Today's Date: 07/01/2023   History of present illness Pt is a 58 y/o F admitted on 06/28/23 after presenting with c/o weakness, fall, burning on urination. Pt is being treated for UTI. PMH: lupus, arthritis with finger deformity, anemia, depression, severe protein calorie malnutrition   OT comments  Pt seen for OT treatment on this date. Upon arrival to room pt supine in bed, agreeable to tx. Pt requires MOD A amb to BSC + RW, pt amb improved throughout session. Requiring CGA + RW during amb within hallway ~28ft. Pt retired in bed with all needs within reach. Pt making good progress toward goals, will continue to follow POC. Discharge recommendation remains appropriate.        If plan is discharge home, recommend the following:  A little help with walking and/or transfers;A little help with bathing/dressing/bathroom;Help with stairs or ramp for entrance;Supervision due to cognitive status   Equipment Recommendations  BSC/3in1    Recommendations for Other Services      Precautions / Restrictions Precautions Precautions: Fall Recall of Precautions/Restrictions: Intact Restrictions Weight Bearing Restrictions Per Provider Order: No       Mobility Bed Mobility Overal bed mobility: Needs Assistance Bed Mobility: Supine to Sit, Sit to Supine     Supine to sit: Min assist, HOB elevated, Used rails Sit to supine: Min assist, HOB elevated   General bed mobility comments: MINA for LE and raised bed height    Transfers Overall transfer level: Needs assistance Equipment used: Rolling walker (2 wheels) Transfers: Sit to/from Stand, Bed to chair/wheelchair/BSC Sit to Stand: Mod assist           General transfer comment: Pt states she needs assist for lift off and extra time to gain her balance before amb.     Balance Overall balance assessment: Needs assistance Sitting-balance  support: Feet supported Sitting balance-Leahy Scale: Good     Standing balance support: Bilateral upper extremity supported, During functional activity, Reliant on assistive device for balance Standing balance-Leahy Scale: Fair                             ADL either performed or assessed with clinical judgement   ADL Overall ADL's : Needs assistance/impaired                         Toilet Transfer: Moderate assistance;Ambulation;BSC/3in1;Rolling walker (2 wheels)   Toileting- Clothing Manipulation and Hygiene: Modified independent       Functional mobility during ADLs: Contact guard assist;Rolling walker (2 wheels);Cueing for safety General ADL Comments: MOD A amb to BSC + RW    Extremity/Trunk Assessment              Vision       Perception     Praxis     Communication Communication Communication: No apparent difficulties   Cognition Arousal: Alert Behavior During Therapy: WFL for tasks assessed/performed Cognition: No family/caregiver present to determine baseline             OT - Cognition Comments: a/o x4                 Following commands: Intact Following commands impaired: Follows multi-step commands with increased time      Cueing   Cueing Techniques: Verbal cues  Exercises Exercises: Other exercises Other Exercises Other Exercises: Edu: safe ADL completion, dressing  techniques    Shoulder Instructions       General Comments Pt expressed she is feeling good this afternoon and very hopful of future    Pertinent Vitals/ Pain       Pain Assessment Pain Assessment: Faces Faces Pain Scale: Hurts little more Pain Intervention(s): Premedicated before session, Limited activity within patient's tolerance, Monitored during session  Home Living                                          Prior Functioning/Environment              Frequency  Min 1X/week        Progress Toward Goals  OT  Goals(current goals can now be found in the care plan section)  Progress towards OT goals: Progressing toward goals  Acute Rehab OT Goals Patient Stated Goal: go to rehab OT Goal Formulation: With patient Time For Goal Achievement: 07/13/23 Potential to Achieve Goals: Fair ADL Goals Pt Will Perform Grooming: with modified independence;sitting Pt Will Perform Lower Body Dressing: with modified independence;sit to/from stand Pt Will Transfer to Toilet: with modified independence;ambulating;bedside commode  Plan      Co-evaluation                 AM-PAC OT "6 Clicks" Daily Activity     Outcome Measure   Help from another person eating meals?: None Help from another person taking care of personal grooming?: A Little Help from another person toileting, which includes using toliet, bedpan, or urinal?: A Little Help from another person bathing (including washing, rinsing, drying)?: A Lot Help from another person to put on and taking off regular upper body clothing?: A Little Help from another person to put on and taking off regular lower body clothing?: A Lot 6 Click Score: 17    End of Session Equipment Utilized During Treatment: Gait belt;Rolling walker (2 wheels)  OT Visit Diagnosis: Other abnormalities of gait and mobility (R26.89);Muscle weakness (generalized) (M62.81)   Activity Tolerance Patient tolerated treatment well   Patient Left in bed;with call bell/phone within reach;with bed alarm set   Nurse Communication Mobility status        Time: 1610-9604 OT Time Calculation (min): 26 min  Charges: OT General Charges $OT Visit: 1 Visit OT Treatments $Self Care/Home Management : 8-22 mins $Therapeutic Activity: 8-22 mins  Glenard Haring M.S. OTR/L  07/01/23, 4:44 PM
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
## 2023-07-01 NOTE — Progress Notes (Signed)
 Physical Therapy Treatment Patient Details Name: Sheila Lang MRN: [id number] DOB: March 07, 1966 Today's Date: 07/01/2023   History of Present Illness Pt is a 58 y/o F admitted on 06/28/23 after presenting with c/o weakness, fall, burning on urination. Pt is being treated for UTI. PMH: lupus, arthritis with finger deformity, anemia, depression, severe protein calorie malnutrition    PT Comments  Patient received in bed, she is agreeable to PT session. Requests use of BSC prior to ambulation. Patient requires min/mod A with trunk elevation in bed. Min/mod A for sit to stand from bed and commode. Patient is able to ambulate with RW 125 feet with cga. Decreased pace and reports B LE pain during mobility. Patient is making good progress and is very appreciative of the assistance. She will continue to benefit from skilled PT to improve functional independence, strength and safety.       If plan is discharge home, recommend the following: A little help with walking and/or transfers;A little help with bathing/dressing/bathroom;Assist for transportation;Assistance with cooking/housework;Direct supervision/assist for medications management;Assistance with feeding   Can travel by private vehicle     Yes  Equipment Recommendations  None recommended by PT    Recommendations for Other Services       Precautions / Restrictions Precautions Precautions: Fall Recall of Precautions/Restrictions: Intact Restrictions Weight Bearing Restrictions Per Provider Order: No     Mobility  Bed Mobility Overal bed mobility: Needs Assistance Bed Mobility: Supine to Sit     Supine to sit: Min assist, HOB elevated, Used rails     General bed mobility comments: Min A to raise trunk    Transfers Overall transfer level: Needs assistance Equipment used: Rolling walker (2 wheels) Transfers: Sit to/from Stand, Bed to chair/wheelchair/BSC Sit to Stand: Min assist   Step pivot transfers: Min assist        General transfer comment: Requires assist to power up and get balance. Cues for hand placement. Assist needed to lower down safely.    Ambulation/Gait Ambulation/Gait assistance: Contact guard assist Gait Distance (Feet): 125 Feet Assistive device: Rolling walker (2 wheels) Gait Pattern/deviations: Step-through pattern, Decreased step length - right, Decreased step length - left Gait velocity: Decr     General Gait Details: patient ambulated to nurses desk and back to room. No lob, reports B LE pain with ambulation.   Stairs             Wheelchair Mobility     Tilt Bed    Modified Rankin (Stroke Patients Only)       Balance Overall balance assessment: Needs assistance Sitting-balance support: Feet supported Sitting balance-Leahy Scale: Good     Standing balance support: Bilateral upper extremity supported, During functional activity, Reliant on assistive device for balance Standing balance-Leahy Scale: Fair                              Hotel manager: No apparent difficulties  Cognition Arousal: Alert Behavior During Therapy: WFL for tasks assessed/performed   PT - Cognitive impairments: No apparent impairments                         Following commands: Intact Following commands impaired: Follows multi-step commands with increased time    Cueing Cueing Techniques: Verbal cues  Exercises      General Comments        Pertinent Vitals/Pain Pain Assessment Pain Assessment: Faces Faces Pain Scale: Hurts  a little bit Pain Location: backs of legs Pain Descriptors / Indicators: Discomfort, Sore Pain Intervention(s): Monitored during session, Repositioned    Home Living                          Prior Function            PT Goals (current goals can now be found in the care plan section) Acute Rehab PT Goals Patient Stated Goal: go to rehab PT Goal Formulation: With patient Time For  Goal Achievement: 07/13/23 Potential to Achieve Goals: Good Progress towards PT goals: Progressing toward goals    Frequency    Min 2X/week      PT Plan      Co-evaluation              AM-PAC PT "6 Clicks" Mobility   Outcome Measure  Help needed turning from your back to your side while in a flat bed without using bedrails?: A Lot Help needed moving from lying on your back to sitting on the side of a flat bed without using bedrails?: A Lot Help needed moving to and from a bed to a chair (including a wheelchair)?: A Lot Help needed standing up from a chair using your arms (e.g., wheelchair or bedside chair)?: A Lot Help needed to walk in hospital room?: A Little Help needed climbing 3-5 steps with a railing? : Total 6 Click Score: 12    End of Session Equipment Utilized During Treatment: Gait belt Activity Tolerance: Patient tolerated treatment well Patient left: in chair;with call bell/phone within reach;with chair alarm set Nurse Communication: Mobility status PT Visit Diagnosis: Muscle weakness (generalized) (M62.81);History of falling (Z91.81);Unsteadiness on feet (R26.81);Difficulty in walking, not elsewhere classified (R26.2);Adult, failure to thrive (R62.7);Other abnormalities of gait and mobility (R26.89);Pain Pain - Right/Left:  (B) Pain - part of body: Leg     Time: 1610-9604 PT Time Calculation (min) (ACUTE ONLY): 18 min  Charges:    $Gait Training: 8-22 mins PT General Charges $$ ACUTE PT VISIT: 1 Visit                     Tiesha Marich, PT, GCS 07/01/23,11:41 AM
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
## 2023-07-02 DIAGNOSIS — N3000 Acute cystitis without hematuria: Secondary | ICD-10-CM | POA: Diagnosis not present

## 2023-07-02 MED ORDER — NAPROXEN 250 MG PO TABS
250.0000 mg | ORAL_TABLET | Freq: Two times a day (BID) | ORAL | Status: DC | PRN
  Administered 2023-07-02 – 2023-07-06 (×6): 250 mg via ORAL
  Filled 2023-07-02 (×7): qty 1

## 2023-07-02 NOTE — TOC Progression Note (Addendum)
 Transition of Care [facility]) - Progression Note    Patient Details  Name: Sheila Lang MRN: [id number] Date of Birth: October 22, 1965  [facility]) CM/SW Contact  Erin Sons, [place] Phone Number: 07/02/2023, 11:01 AM  Clinical Narrative:     SNF offers and medicare star ratings provided to pt. She is on the phone and requests CSW come back later. CSW to follow up with SNF choice.   1510: CSW met with pt for SNF choice. She states she would like [facility]. CSW awaiting confirmation from [facility] and requested that they start auth.  Pt also provides medicaid pending# [id number]  Expected Discharge Plan: Skilled Nursing Facility Barriers to Discharge: Insurance Authorization, SNF Pending bed offer  Expected Discharge Plan and Services       Living arrangements for the past 2 months: Single Family Home                                       Social Determinants of Health (SDOH) Interventions SDOH Screenings   Food Insecurity: No Food Insecurity (06/28/2023)  Recent Concern: Food Insecurity - Food Insecurity Present (05/13/2023)  Housing: Low Risk  (06/28/2023)  Transportation Needs: No Transportation Needs (06/28/2023)  Recent Concern: Transportation Needs - Unmet Transportation Needs (05/13/2023)  Utilities: Not At Risk (06/28/2023)  Alcohol Screen: Low Risk  (05/23/2019)  Depression (PHQ2-9): Low Risk  (08/20/2020)  Social Connections: Moderately Integrated (06/28/2023)  Tobacco Use: Low Risk  (06/28/2023)    Readmission Risk Interventions     No data to display

## 2023-07-02 NOTE — Progress Notes (Signed)
 OT Cancellation Note  Patient Details Name: Sheila Lang MRN: [id number] DOB: 05/04/1965   Cancelled Treatment:    Reason Eval/Treat Not Completed: Other (comment); Pt had requested instruction in LB dressing AE.  OT offered this, but pt declined today reporting that she was too cold, despite being under a blanket, tired, and would not be able to pay attention to education.  Pt requested return another day.  Will attempt later as time allows.  Danelle Earthly, MS, OTR/L   Sheila Lang 07/02/2023, 4:44 PM

## 2023-07-02 NOTE — Progress Notes (Signed)
 Patient request sacral foam to be removed.  Patient educated foam purpose to decrease skin breakdown with poor understanding.  "I don't want it on there anymore."

## 2023-07-02 NOTE — Progress Notes (Signed)
 Mobility Specialist - Progress Note   07/02/23 1500  Mobility  Activity Transferred to/from [facility];Ambulated with assistance in hallway  Level of Assistance Contact guard assist, steadying assist  Assistive Device Front wheel walker  Distance Ambulated (ft) 200 ft  Activity Response Tolerated well  Mobility visit 1 Mobility     Pt sitting in recliner upon arrival, utilizing RA. Pt motivated for activity. Power up assist to stand from recliner. CGA to ambulate in hallway. No LOB. Does veer very slightly to L. No complaints. Pt returned to chair with alarm set, needs in reach.    Filiberto Pinks Mobility Specialist 07/02/23, 3:47 PM

## 2023-07-02 NOTE — Progress Notes (Signed)
 Nutrition Follow-up  DOCUMENTATION CODES:   Underweight, Severe malnutrition in context of social or environmental circumstances  INTERVENTION:   -Continue regular diet -Continue MVI with minerals daily -Continue Ensure Enlive po BID, each supplement provides 350 kcal and 20 grams of protein -Last documented BM on 06/29/23; discussed with RN and MD about starting bowel regimen  NUTRITION DIAGNOSIS:   Severe Malnutrition related to social / environmental circumstances as evidenced by moderate fat depletion, severe fat depletion, moderate muscle depletion, severe muscle depletion.  Ongoing  GOAL:   Patient will meet greater than or equal to 90% of their needs  Progressing   MONITOR:   PO intake, Supplement acceptance  REASON FOR ASSESSMENT:   Consult Assessment of nutrition requirement/status  ASSESSMENT:   Pt with medical history significant of lupus, arthritis with finger deformity, anemia, depression, severe protein calorie malnutrition, who presents with weakness, fall, burning on urination.  Reviewed I/O's: -60 ml x 24 hours and +1.3 L since admission  UOP: 300 ml x 24 hours   Pt sleeping soundly at time of visit. Pt remains with good appetite, noted meal completions 100%. Pt also consuming Ensure supplements.   Noted last documented BM was on 06/29/23. Case discussed with MD and RN; recommended bowel regimen.   Per TOC notes, plan for SNF placement. PASRR pending.   No new wt since last visit.   Medications reviewed and include protonix and thiamine.   Labs reviewed: CBGS: 74-81 (inpatient orders for glycemic control are none).    Diet Order:   Diet Order             Diet regular Room service appropriate? Yes; Fluid consistency: Thin  Diet effective now                   EDUCATION NEEDS:   Not appropriate for education at this time  Skin:  Skin Assessment: Reviewed RN Assessment  Last BM:  06/29/23  Height:   Ht Readings from Last 1  Encounters:  05/12/23 5' (1.524 m)    Weight:   Wt Readings from Last 1 Encounters:  06/28/23 32.5 kg    Ideal Body Weight:  45.5 kg  BMI:  Body mass index is 13.99 kg/m.  Estimated Nutritional Needs:   Kcal:  1300-1500  Protein:  65-80 grams  Fluid:  > 1.3 L    Levada Schilling, RD, LDN, CDCES Registered Dietitian III Certified Diabetes Care and Education Specialist If unable to reach this RD, please use "RD Inpatient" group chat on secure chat between hours of 8am-4 pm daily
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
## 2023-07-03 DIAGNOSIS — N3000 Acute cystitis without hematuria: Secondary | ICD-10-CM | POA: Diagnosis not present

## 2023-07-03 DIAGNOSIS — Y92009 Unspecified place in unspecified non-institutional (private) residence as the place of occurrence of the external cause: Secondary | ICD-10-CM | POA: Diagnosis not present

## 2023-07-03 DIAGNOSIS — E43 Unspecified severe protein-calorie malnutrition: Secondary | ICD-10-CM | POA: Diagnosis not present

## 2023-07-03 DIAGNOSIS — W19XXXA Unspecified fall, initial encounter: Secondary | ICD-10-CM | POA: Diagnosis not present

## 2023-07-03 NOTE — Progress Notes (Signed)
 Physical Therapy Treatment Patient Details Name: Sheila Lang MRN: [id number] DOB: 04-19-1966 Today's Date: 07/03/2023   History of Present Illness Pt is a 58 y/o F admitted on 06/28/23 after presenting with c/o weakness, fall, burning on urination. Pt is being treated for UTI. PMH: lupus, arthritis with finger deformity, anemia, depression, severe protein calorie malnutrition    PT Comments  Patient received in bed, she is agreeable to PT session. Motivated to walk. Patient requires assist to don socks, min A to elevate trunk to seated position. Cga/min A to stand from bed and BSC. Patient was able to ambulate 125 feet with RW and cga. No lob, limited by fatigue and leg/foot pain. Patient will continue to benefit from skilled PT to improve strength and functional independence.     If plan is discharge home, recommend the following: A little help with walking and/or transfers;A little help with bathing/dressing/bathroom;Assist for transportation;Assistance with cooking/housework;Direct supervision/assist for medications management;Assistance with feeding   Can travel by private vehicle     Yes  Equipment Recommendations  None recommended by PT    Recommendations for Other Services       Precautions / Restrictions Precautions Precautions: Fall Recall of Precautions/Restrictions: Intact Restrictions Weight Bearing Restrictions Per Provider Order: No     Mobility  Bed Mobility Overal bed mobility: Needs Assistance Bed Mobility: Supine to Sit     Supine to sit: Min assist, HOB elevated, Used rails     General bed mobility comments: min A to raise trunk    Transfers Overall transfer level: Needs assistance Equipment used: Rolling walker (2 wheels) Transfers: Sit to/from Stand Sit to Stand: Contact guard assist           General transfer comment: patient requiring decreased assist for sit to stand this session. Cga/minA.    Ambulation/Gait Ambulation/Gait assistance:  Contact guard assist Gait Distance (Feet): 125 Feet Assistive device: Rolling walker (2 wheels) Gait Pattern/deviations: Step-through pattern, Decreased step length - right, Decreased step length - left, Decreased stride length Gait velocity: Decr     General Gait Details: patient ambulated to nurses desk and back to room. No lob, reports B LE/foot pain with ambulation.   Stairs             Wheelchair Mobility     Tilt Bed    Modified Rankin (Stroke Patients Only)       Balance Overall balance assessment: Needs assistance Sitting-balance support: Feet supported Sitting balance-Leahy Scale: Good     Standing balance support: Bilateral upper extremity supported, During functional activity, Reliant on assistive device for balance Standing balance-Leahy Scale: Good                              Communication Communication Communication: No apparent difficulties  Cognition Arousal: Alert Behavior During Therapy: WFL for tasks assessed/performed   PT - Cognitive impairments: No apparent impairments                         Following commands: Intact Following commands impaired: Follows multi-step commands with increased time    Cueing Cueing Techniques: Verbal cues  Exercises      General Comments        Pertinent Vitals/Pain Pain Assessment Pain Assessment: Faces Faces Pain Scale: Hurts a little bit Pain Location: legs, feet Pain Descriptors / Indicators: Discomfort Pain Intervention(s): Monitored during session, Repositioned    Home Living  Prior Function            PT Goals (current goals can now be found in the care plan section) Acute Rehab PT Goals Patient Stated Goal: go to rehab PT Goal Formulation: With patient Time For Goal Achievement: 07/13/23 Potential to Achieve Goals: Good Progress towards PT goals: Progressing toward goals    Frequency    Min 2X/week      PT Plan       Co-evaluation              AM-PAC PT "6 Clicks" Mobility   Outcome Measure  Help needed turning from your back to your side while in a flat bed without using bedrails?: A Little Help needed moving from lying on your back to sitting on the side of a flat bed without using bedrails?: A Little Help needed moving to and from a bed to a chair (including a wheelchair)?: A Little Help needed standing up from a chair using your arms (e.g., wheelchair or bedside chair)?: A Little Help needed to walk in hospital room?: A Little Help needed climbing 3-5 steps with a railing? : A Lot 6 Click Score: 17    End of Session Equipment Utilized During Treatment: Gait belt Activity Tolerance: Patient tolerated treatment well;Patient limited by fatigue Patient left: in chair;with call bell/phone within reach;with chair alarm set Nurse Communication: Mobility status PT Visit Diagnosis: Muscle weakness (generalized) (M62.81);History of falling (Z91.81);Unsteadiness on feet (R26.81);Difficulty in walking, not elsewhere classified (R26.2);Adult, failure to thrive (R62.7);Other abnormalities of gait and mobility (R26.89);Pain Pain - Right/Left:  (Bilateral) Pain - part of body: Leg;Ankle and joints of foot     Time: 1610-9604 PT Time Calculation (min) (ACUTE ONLY): 15 min  Charges:    $Gait Training: 8-22 mins PT General Charges $$ ACUTE PT VISIT: 1 Visit                     Slayden Mennenga, PT, GCS 07/03/23,10:24 AM

## 2023-07-03 NOTE — TOC Progression Note (Addendum)
 Transition of Care [facility]) - Progression Note    Patient Details  Name: Kimmi Acocella MRN: [id number] Date of Birth: 1965/09/24  [facility]) CM/SW Contact  Erin Sons, [place] Phone Number: 07/03/2023, 8:59 AM  Clinical Narrative:     [facility] has rescinded bed offer. Notified pt. She is agreeable to [facility].   CSW is awaiting confirmation from [facility] and requested they start auth.  1200: [facility] confirmed bed offer and are initiating SNF authorization  Expected Discharge Plan: Skilled Nursing Facility Barriers to Discharge: English as a second language teacher, SNF Pending bed offer  Expected Discharge Plan and Services       Living arrangements for the past 2 months: Single Family Home                                       Social Determinants of Health (SDOH) Interventions SDOH Screenings   Food Insecurity: No Food Insecurity (06/28/2023)  Recent Concern: Food Insecurity - Food Insecurity Present (05/13/2023)  Housing: Low Risk  (06/28/2023)  Transportation Needs: No Transportation Needs (06/28/2023)  Recent Concern: Transportation Needs - Unmet Transportation Needs (05/13/2023)  Utilities: Not At Risk (06/28/2023)  Alcohol Screen: Low Risk  (05/23/2019)  Depression (PHQ2-9): Low Risk  (08/20/2020)  Social Connections: Moderately Integrated (06/28/2023)  Tobacco Use: Low Risk  (06/28/2023)    Readmission Risk Interventions     No data to display

## 2023-07-03 NOTE — Progress Notes (Addendum)
 Mobility Specialist - Progress Note   07/03/23 1100  Mobility  Activity Ambulated with assistance in hallway  Level of Assistance Contact guard assist, steadying assist  Assistive Device Front wheel walker  Distance Ambulated (ft) 200 ft  Activity Response Tolerated well  Mobility visit 1 Mobility     Pt sitting in recliner upon arrival, utilizing RA. Pt agreeable to activity. STS with power up assist. VC for hand placement. Ambulated in hallway with CGA. Noted pt swaying, but was corrected with cueing. No complaints. She does report "tightness in calves" with ambulation, but denies pain and soreness. Pt left in chair with alarm set, needs in reach.    Filiberto Pinks Mobility Specialist 07/03/23, 11:53 AM

## 2023-07-03 NOTE — Plan of Care (Signed)
   Problem: Elimination: Goal: Will not experience complications related to urinary retention Outcome: Progressing   Problem: Safety: Goal: Ability to remain free from injury will improve Outcome: Progressing
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
## 2023-07-04 DIAGNOSIS — W19XXXA Unspecified fall, initial encounter: Secondary | ICD-10-CM | POA: Diagnosis not present

## 2023-07-04 DIAGNOSIS — Y92009 Unspecified place in unspecified non-institutional (private) residence as the place of occurrence of the external cause: Secondary | ICD-10-CM | POA: Diagnosis not present

## 2023-07-04 DIAGNOSIS — E43 Unspecified severe protein-calorie malnutrition: Secondary | ICD-10-CM | POA: Diagnosis not present

## 2023-07-04 DIAGNOSIS — N3000 Acute cystitis without hematuria: Secondary | ICD-10-CM | POA: Diagnosis not present

## 2023-07-04 MED ORDER — DICLOFENAC SODIUM 1 % EX GEL
2.0000 g | Freq: Two times a day (BID) | CUTANEOUS | Status: DC | PRN
  Administered 2023-07-05 – 2023-07-07 (×2): 2 g via TOPICAL
  Filled 2023-07-04: qty 100

## 2023-07-04 NOTE — Plan of Care (Signed)

## 2023-07-04 NOTE — Progress Notes (Signed)
   07/04/23 1630  Assess: MEWS Score  Temp 98.1 F (36.7 C)  BP (!) 96/54  MAP (mmHg) 66  Pulse Rate (!) 109  Resp 16  SpO2 100 %  O2 Device Room Air  Assess: MEWS Score  MEWS Temp 0  MEWS Systolic 1  MEWS Pulse 1  MEWS RR 0  MEWS LOC 0  MEWS Score 2  MEWS Score Color Yellow  Assess: if the MEWS score is Yellow or Red  Were vital signs accurate and taken at a resting state? Yes  Does the patient meet 2 or more of the SIRS criteria? No  MEWS guidelines implemented  Yes, yellow  Treat  MEWS Interventions Considered administering scheduled or prn medications/treatments as ordered  Take Vital Signs  Increase Vital Sign Frequency  Yellow: Q2hr x1, continue Q4hrs until patient remains green for 12hrs  Escalate  MEWS: Escalate Yellow: Discuss with charge nurse and consider notifying provider and/or RRT  Notify: Charge Nurse/RN  Name of Charge Nurse/RN Notified Brandi RN  Provider Notification  Provider Name/Title Dr Ashok Pall  Date Provider Notified 07/04/23  Time Provider Notified [phone]  Notification Reason Change in status  Provider response No new orders  Date of Provider Response 07/04/23  Time of Provider Response 1633  Assess: SIRS CRITERIA  SIRS Temperature  0  SIRS Respirations  0  SIRS Pulse 1  SIRS WBC 0  SIRS Score Sum  1
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
## 2023-07-05 DIAGNOSIS — Y92009 Unspecified place in unspecified non-institutional (private) residence as the place of occurrence of the external cause: Secondary | ICD-10-CM | POA: Diagnosis not present

## 2023-07-05 DIAGNOSIS — E43 Unspecified severe protein-calorie malnutrition: Secondary | ICD-10-CM | POA: Diagnosis not present

## 2023-07-05 DIAGNOSIS — W19XXXA Unspecified fall, initial encounter: Secondary | ICD-10-CM | POA: Diagnosis not present

## 2023-07-05 DIAGNOSIS — N3000 Acute cystitis without hematuria: Secondary | ICD-10-CM | POA: Diagnosis not present
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
## 2023-07-05 NOTE — Plan of Care (Signed)

## 2023-07-05 NOTE — Plan of Care (Signed)
  Problem: Education: Goal: Knowledge of General Education information will improve Description: Including pain rating scale, medication(s)/side effects and non-pharmacologic comfort measures Outcome: Progressing   Problem: Health Behavior/Discharge Planning: Goal: Ability to manage health-related needs will improve Outcome: Progressing   Problem: Clinical Measurements: Goal: Ability to maintain clinical measurements within normal limits will improve Outcome: Progressing Goal: Will remain free from infection Outcome: Progressing Goal: Diagnostic test results will improve Outcome: Progressing Goal: Respiratory complications will improve Outcome: Progressing Goal: Cardiovascular complication will be avoided Outcome: Progressing   Problem: Nutrition: Goal: Adequate nutrition will be maintained Outcome: Progressing   Problem: Coping: Goal: Level of anxiety will decrease Outcome: Progressing   Problem: Elimination: Goal: Will not experience complications related to bowel motility Outcome: Progressing Goal: Will not experience complications related to urinary retention Outcome: Progressing   Problem: Skin Integrity: Goal: Risk for impaired skin integrity will decrease Outcome: Progressing   

## 2023-07-06 DIAGNOSIS — E43 Unspecified severe protein-calorie malnutrition: Secondary | ICD-10-CM | POA: Diagnosis not present

## 2023-07-06 DIAGNOSIS — Y92009 Unspecified place in unspecified non-institutional (private) residence as the place of occurrence of the external cause: Secondary | ICD-10-CM | POA: Diagnosis not present

## 2023-07-06 DIAGNOSIS — W19XXXA Unspecified fall, initial encounter: Secondary | ICD-10-CM | POA: Diagnosis not present

## 2023-07-06 DIAGNOSIS — N3000 Acute cystitis without hematuria: Secondary | ICD-10-CM | POA: Diagnosis not present

## 2023-07-06 NOTE — TOC Progression Note (Signed)
 Transition of Care [facility]) - Progression Note    Patient Details  Name: Sheila Lang MRN: [id number] Date of Birth: 06-Sep-1965  Transition of Care [facility]) CM/SW Contact  Chapman Fitch, RN Phone Number: 07/06/2023, 12:21 PM  Clinical Narrative:    Message sent to Kenney Houseman at [facility] for update on status of insurance auth   Expected Discharge Plan: Skilled Nursing Facility Barriers to Discharge: Insurance Authorization, SNF Pending bed offer  Expected Discharge Plan and Services       Living arrangements for the past 2 months: Single Family Home                                       Social Determinants of Health (SDOH) Interventions SDOH Screenings   Food Insecurity: No Food Insecurity (06/28/2023)  Recent Concern: Food Insecurity - Food Insecurity Present (05/13/2023)  Housing: Low Risk  (06/28/2023)  Transportation Needs: No Transportation Needs (06/28/2023)  Recent Concern: Transportation Needs - Unmet Transportation Needs (05/13/2023)  Utilities: Not At Risk (06/28/2023)  Alcohol Screen: Low Risk  (05/23/2019)  Depression (PHQ2-9): Low Risk  (08/20/2020)  Social Connections: Moderately Integrated (06/28/2023)  Tobacco Use: Low Risk  (06/28/2023)    Readmission Risk Interventions     No data to display

## 2023-07-06 NOTE — Plan of Care (Signed)

## 2023-07-06 NOTE — Progress Notes (Signed)
 Physical Therapy Treatment Patient Details Name: Sheila Lang MRN: [id number] DOB: 12/12/1965 Today's Date: 07/06/2023   History of Present Illness Pt is a 58 y/o F admitted on 06/28/23 after presenting with c/o weakness, fall, burning on urination. Pt is being treated for UTI. PMH: lupus, arthritis with finger deformity, anemia, depression, severe protein calorie malnutrition    PT Comments  Patient in recliner upon arrival, agreeable to session. Request to use bathroom. Patient require Mod A to scoot up in recliner as slumped down, prior to lowering LE's to stand. Able to stand from recliner with CGA. Patient able to ambulate to/from bathroom, and additional gait distance in hallway approx 100 ft total prior to handoff to OT. Patient overall demo good safety awareness, but slow gait speed and mobility overall. Patient's BP soft but stable throughout session. D/c recommendation remains appropriate. Will continue to follow acutely.     If plan is discharge home, recommend the following: A little help with walking and/or transfers;A little help with bathing/dressing/bathroom;Assist for transportation;Assistance with cooking/housework;Direct supervision/assist for medications management;Assistance with feeding   Can travel by private vehicle     Yes  Equipment Recommendations  None recommended by PT    Recommendations for Other Services       Precautions / Restrictions Precautions Precautions: Fall Recall of Precautions/Restrictions: Intact Restrictions Weight Bearing Restrictions Per Provider Order: No     Mobility  Bed Mobility               General bed mobility comments: recieved in recliner; patient slumped in recliner require Mod A to scoot up prior to lowering LE's.    Transfers Overall transfer level: Needs assistance Equipment used: Rolling walker (2 wheels) Transfers: Sit to/from Stand Sit to Stand: Contact guard assist, Min assist           General transfer  comment: CGA to stand from recliner. Cues for hand placement to RW. BP soft: 93/63 prior to mobility, but asymptomatic.    Ambulation/Gait Ambulation/Gait assistance: Contact guard assist Gait Distance (Feet): 100 Feet Assistive device: Rolling walker (2 wheels) Gait Pattern/deviations: Step-through pattern, Decreased step length - right, Decreased step length - left, Decreased stride length Gait velocity: Decreased     General Gait Details: Patient ambulated from recliner to bathroom. Then after using bathroom, agreeable to additional ambulation. Patient completed 100 ft with PT prior to handoff completed to OT to finish additional ambulation. CGA throughout. Slow Gait Pattern.   Stairs             Wheelchair Mobility     Tilt Bed    Modified Rankin (Stroke Patients Only)       Balance Overall balance assessment: Needs assistance Sitting-balance support: Feet supported Sitting balance-Leahy Scale: Good     Standing balance support: Bilateral upper extremity supported, During functional activity, Reliant on assistive device for balance Standing balance-Leahy Scale: Good                              Communication Communication Communication: No apparent difficulties  Cognition Arousal: Alert Behavior During Therapy: WFL for tasks assessed/performed                             Following commands: Intact      Cueing Cueing Techniques: Verbal cues  Exercises      General Comments General comments (skin integrity, edema, etc.): Pt reporting feeling SOB  with VSS on RA      Pertinent Vitals/Pain Pain Assessment Pain Assessment: No/denies pain    Home Living                          Prior Function            PT Goals (current goals can now be found in the care plan section) Acute Rehab PT Goals PT Goal Formulation: With patient Time For Goal Achievement: 07/13/23 Potential to Achieve Goals: Good Progress towards PT  goals: Progressing toward goals    Frequency    Min 2X/week      PT Plan      Co-evaluation              AM-PAC PT "6 Clicks" Mobility   Outcome Measure  Help needed turning from your back to your side while in a flat bed without using bedrails?: A Little Help needed moving from lying on your back to sitting on the side of a flat bed without using bedrails?: A Little Help needed moving to and from a bed to a chair (including a wheelchair)?: A Little Help needed standing up from a chair using your arms (e.g., wheelchair or bedside chair)?: A Little Help needed to walk in hospital room?: A Little Help needed climbing 3-5 steps with a railing? : A Lot 6 Click Score: 17    End of Session Equipment Utilized During Treatment: Gait belt Activity Tolerance: Patient tolerated treatment well;Patient limited by fatigue Patient left: in chair;with call bell/phone within reach;with chair alarm set Nurse Communication: Mobility status PT Visit Diagnosis: Muscle weakness (generalized) (M62.81);History of falling (Z91.81);Unsteadiness on feet (R26.81);Difficulty in walking, not elsewhere classified (R26.2);Adult, failure to thrive (R62.7);Other abnormalities of gait and mobility (R26.89);Pain Pain - part of body: Leg;Ankle and joints of foot     Time: 9147-8295 PT Time Calculation (min) (ACUTE ONLY): 18 min  Charges:    $Gait Training: 8-22 mins PT General Charges $$ ACUTE PT VISIT: 1 Visit                     Howie Ill, PT, DPT 07/06/23 3:36 PM

## 2023-07-06 NOTE — TOC Progression Note (Signed)
 Transition of Care [facility]) - Progression Note    Patient Details  Name: Sheila Lang MRN: [id number] Date of Birth: 22-Oct-1965  [facility]) CM/SW Contact  Chapman Fitch, RN Phone Number: 07/06/2023, 4:26 PM  Clinical Narrative:    Kenney Houseman at [facility] unable to check status of auth at this time and states she will have to update [facility] tomorrow    Expected Discharge Plan: Skilled Nursing Facility Barriers to Discharge: Insurance Authorization, SNF Pending bed offer  Expected Discharge Plan and Services       Living arrangements for the past 2 months: Single Family Home                                       Social Determinants of Health (SDOH) Interventions SDOH Screenings   Food Insecurity: No Food Insecurity (06/28/2023)  Recent Concern: Food Insecurity - Food Insecurity Present (05/13/2023)  Housing: Low Risk  (06/28/2023)  Transportation Needs: No Transportation Needs (06/28/2023)  Recent Concern: Transportation Needs - Unmet Transportation Needs (05/13/2023)  Utilities: Not At Risk (06/28/2023)  Alcohol Screen: Low Risk  (05/23/2019)  Depression (PHQ2-9): Low Risk  (08/20/2020)  Social Connections: Moderately Integrated (06/28/2023)  Tobacco Use: Low Risk  (06/28/2023)    Readmission Risk Interventions     No data to display
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
## 2023-07-06 NOTE — Progress Notes (Signed)
 Occupational Therapy Treatment Patient Details Name: Mayukha Symmonds MRN: [id number] DOB: Jul 02, 1965 Today's Date: 07/06/2023   History of present illness Pt is a 58 y/o F admitted on 06/28/23 after presenting with c/o weakness, fall, burning on urination. Pt is being treated for UTI. PMH: lupus, arthritis with finger deformity, anemia, depression, severe protein calorie malnutrition   OT comments  Pt seen for OT tx session this date focusing on mobility and LB dressing strategies. Pt received as handoff from PT in hallway, completes half a lap on unit with RW and CGA for safety. OT provides education and visual/verbal demo of use on AE for independence in LB dressing. After edu provided by OT, pt able to complete donning underwear from sit<>stands with verbal cues for technique, no physical assist. Pt with good efforts on attempting to use sock aide, but limitations in [facility] with impaired DIP/PIP flexion bilaterally caused difficulties with pt politely requesting to defer task to later time due to frustration. OT assists with helping pt text daughter as pt currently unable to type. Pt making good progress towards goals. Sent message to mobility team and alerted RN to pt's requests to walk again in hallway later. OT will continue to follow. Discharge recommendation appropriate.       If plan is discharge home, recommend the following:  A little help with walking and/or transfers;A little help with bathing/dressing/bathroom;Help with stairs or ramp for entrance;Supervision due to cognitive status   Equipment Recommendations  BSC/3in1       Precautions / Restrictions Precautions Precautions: Fall Recall of Precautions/Restrictions: Intact Restrictions Weight Bearing Restrictions Per Provider Order: No       Mobility Bed Mobility Overal bed mobility: Needs Assistance Bed Mobility: Sit to Supine       Sit to supine: Min assist, HOB elevated        Transfers Overall transfer level:  Needs assistance Equipment used: Rolling walker (2 wheels) Transfers: Sit to/from Stand Sit to Stand: Contact guard assist           General transfer comment: CGA to power up from seated EOB to don underwear     Balance Overall balance assessment: Needs assistance Sitting-balance support: Feet supported Sitting balance-Leahy Scale: Good     Standing balance support: Bilateral upper extremity supported, During functional activity, Reliant on assistive device for balance Standing balance-Leahy Scale: Good                             ADL either performed or assessed with clinical judgement   ADL Overall ADL's : Needs assistance/impaired                     Lower Body Dressing: Sit to/from stand;With adaptive equipment;Minimal assistance Lower Body Dressing Details (indicate cue type and reason): edu on sock aide, reacher. Pt able to complete with min vcs - has difficulties using sock aide due to bilateral coordination impairments with decreased DIP/PIP flexion. able to don underwear with vcs for use of reacher and CGA for sit<>stand             Functional mobility during ADLs: Contact guard assist;Rolling walker (2 wheels);Cueing for safety       Communication Communication Communication: No apparent difficulties   Cognition Arousal: Alert Behavior During Therapy: [facility] for tasks assessed/performed  Following commands: Intact        Cueing   Cueing Techniques: Verbal cues        General Comments Pt reporting feeling SOB with VSS on RA    Pertinent Vitals/ Pain       Pain Assessment Pain Assessment: No/denies pain   Frequency  Min 1X/week        Progress Toward Goals  OT Goals(current goals can now be found in the care plan section)  Progress towards OT goals: Progressing toward goals  Acute Rehab OT Goals OT Goal Formulation: With patient Time For Goal Achievement:  07/13/23 Potential to Achieve Goals: Fair ADL Goals Pt Will Perform Grooming: with modified independence;sitting Pt Will Perform Lower Body Dressing: with modified independence;sit to/from stand Pt Will Transfer to Toilet: with modified independence;ambulating;bedside commode  Plan         AM-PAC OT "6 Clicks" Daily Activity     Outcome Measure   Help from another person eating meals?: None Help from another person taking care of personal grooming?: A Little Help from another person toileting, which includes using toliet, bedpan, or urinal?: A Little Help from another person bathing (including washing, rinsing, drying)?: A Lot Help from another person to put on and taking off regular upper body clothing?: A Little Help from another person to put on and taking off regular lower body clothing?: A Lot 6 Click Score: 17    End of Session Equipment Utilized During Treatment: Gait belt;Rolling walker (2 wheels) (sock aide and reacher)  OT Visit Diagnosis: Other abnormalities of gait and mobility (R26.89);Muscle weakness (generalized) (M62.81)   Activity Tolerance Patient tolerated treatment well   Patient Left in bed;with call bell/phone within reach;with bed alarm set   Nurse Communication Mobility status        Time: 1347-1420 OT Time Calculation (min): 33 min  Charges: OT General Charges $OT Visit: 1 Visit OT Treatments $Self Care/Home Management : 23-37 mins  Melania Kirks L. Jaislyn Blinn, OTR/L  07/06/23, 2:54 PM

## 2023-07-06 NOTE — Progress Notes (Signed)
 Mobility Specialist - Progress Note   07/06/23 1451  Mobility  Activity Refused mobility     Pt declined mobility; reports she just finished ambulating hallway. Will attempt another date/time.    Filiberto Pinks Mobility Specialist 07/06/23, 2:51 PM

## 2023-07-06 NOTE — Plan of Care (Signed)
  Problem: Education: Goal: Knowledge of General Education information will improve Description: Including pain rating scale, medication(s)/side effects and non-pharmacologic comfort measures Outcome: Progressing   Problem: Health Behavior/Discharge Planning: Goal: Ability to manage health-related needs will improve Outcome: Progressing   Problem: Clinical Measurements: Goal: Ability to maintain clinical measurements within normal limits will improve Outcome: Progressing Goal: Will remain free from infection Outcome: Progressing   Problem: Activity: Goal: Risk for activity intolerance will decrease Outcome: Progressing   Problem: Nutrition: Goal: Adequate nutrition will be maintained Outcome: Progressing   Problem: Pain Managment: Goal: General experience of comfort will improve and/or be controlled Outcome: Progressing   Problem: Safety: Goal: Ability to remain free from injury will improve Outcome: Progressing

## 2023-07-07 DIAGNOSIS — N3000 Acute cystitis without hematuria: Secondary | ICD-10-CM | POA: Diagnosis not present

## 2023-07-07 DIAGNOSIS — E43 Unspecified severe protein-calorie malnutrition: Secondary | ICD-10-CM | POA: Diagnosis not present

## 2023-07-07 DIAGNOSIS — W19XXXA Unspecified fall, initial encounter: Secondary | ICD-10-CM | POA: Diagnosis not present

## 2023-07-07 DIAGNOSIS — Y92009 Unspecified place in unspecified non-institutional (private) residence as the place of occurrence of the external cause: Secondary | ICD-10-CM | POA: Diagnosis not present

## 2023-07-07 NOTE — Plan of Care (Signed)

## 2023-07-07 NOTE — Plan of Care (Signed)
  Problem: Education: Goal: Knowledge of General Education information will improve Description: Including pain rating scale, medication(s)/side effects and non-pharmacologic comfort measures 07/07/2023 0310 by Ike Bene, RN Outcome: Progressing 07/07/2023 0310 by Ike Bene, RN Outcome: Progressing   Problem: Clinical Measurements: Goal: Will remain free from infection 07/07/2023 0310 by Ike Bene, RN Outcome: Progressing 07/07/2023 0310 by Ike Bene, RN Outcome: Progressing   Problem: Activity: Goal: Risk for activity intolerance will decrease 07/07/2023 0310 by Ike Bene, RN Outcome: Progressing 07/07/2023 0310 by Ike Bene, RN Outcome: Progressing   Problem: Nutrition: Goal: Adequate nutrition will be maintained 07/07/2023 0310 by Ike Bene, RN Outcome: Progressing 07/07/2023 0310 by Ike Bene, RN Outcome: Progressing   Problem: Coping: Goal: Level of anxiety will decrease 07/07/2023 0310 by Ike Bene, RN Outcome: Progressing 07/07/2023 0310 by Ike Bene, RN Outcome: Progressing   Problem: Elimination: Goal: Will not experience complications related to bowel motility 07/07/2023 0310 by Ike Bene, RN Outcome: Progressing 07/07/2023 0310 by Ike Bene, RN Outcome: Progressing Goal: Will not experience complications related to urinary retention 07/07/2023 0310 by Ike Bene, RN Outcome: Progressing 07/07/2023 0310 by Ike Bene, RN Outcome: Progressing   Problem: Pain Managment: Goal: General experience of comfort will improve and/or be controlled 07/07/2023 0310 by Ike Bene, RN Outcome: Progressing 07/07/2023 0310 by Ike Bene, RN Outcome: Progressing   Problem: Safety: Goal: Ability to remain free from injury will improve 07/07/2023 0310 by Ike Bene, RN Outcome: Progressing 07/07/2023 0310 by Ike Bene, RN Outcome: Progressing   Problem: Skin Integrity: Goal: Risk for impaired skin integrity will decrease 07/07/2023  0310 by Ike Bene, RN Outcome: Progressing 07/07/2023 0310 by Ike Bene, RN Outcome: Progressing

## 2023-07-07 NOTE — Progress Notes (Signed)
 Author returned as requested after earlier session. Pt was agreeable to ambulation in hallway. She tolerated ambulation ~ 200 ft. With RW + CGA. Pt remains overall very weak and will continue to benefit from skilled PT to maximize her independence and safety while assisting pt to PLOF.

## 2023-07-07 NOTE — Progress Notes (Signed)
 Physical Therapy Treatment Patient Details Name: Sheila Lang MRN: [id number] DOB: [birth year] Today's Date: 07/07/2023   History of Present Illness Pt is a 58 y/o F admitted on 06/28/23 after presenting with c/o weakness, fall, burning on urination. Pt is being treated for UTI. PMH: lupus, arthritis with finger deformity, anemia, depression, severe protein calorie malnutrition    PT Comments  Pt was asleep upon arrival with blankets pulled over her head. She easily awakes and remains pleasant and cooperative however requested not to ambulate out into hallway until after breakfast. Author assisted with breakfast order prior to pt getting OOB with min assist of one. She stood EOB 2 x prior to ambulating to recliner. Pt very slow moving and endorsing lack of sleep previous night. Pt states she has pain all over and its due to per OA. Overall pt is progressing but will continue to benefit from skilled PT to maximize her independence and safety with all ADLs. DC recs remain appropriate.  Pt requested Thereasa Parkin return later this morning to further ambulate.    If plan is discharge home, recommend the following: A little help with walking and/or transfers;A little help with bathing/dressing/bathroom;Assist for transportation;Assistance with cooking/housework;Direct supervision/assist for medications management;Assistance with feeding     Equipment Recommendations  None recommended by PT       Precautions / Restrictions Precautions Precautions: Fall Recall of Precautions/Restrictions: Intact Restrictions Weight Bearing Restrictions Per Provider Order: No     Mobility  Bed Mobility Overal bed mobility: Needs Assistance Bed Mobility: Supine to Sit  Supine to sit: Min assist, Used rails  General bed mobility comments: increased time required. Vcs for impoprved sequencing/ pt is very slow moving and she reports iots due to OA.    Transfers Overall transfer level: Needs assistance Equipment used:  Rolling walker (2 wheels) Transfers: Sit to/from Stand Sit to Stand: Contact guard assist, Min assist   Ambulation/Gait Ambulation/Gait assistance: Contact guard assist Gait Distance (Feet): 8 Feet Assistive device: Rolling walker (2 wheels) Gait Pattern/deviations: Step-through pattern Gait velocity: Decreased  General Gait Details: pt requested to ambulate further distances later this date. " I want to eat breakfast before I walk."    Balance Overall balance assessment: Needs assistance Sitting-balance support: Feet supported Sitting balance-Leahy Scale: Good  Standing balance support: Bilateral upper extremity supported, During functional activity Standing balance-Leahy Scale: Good       Communication Communication Communication: No apparent difficulties  Cognition Arousal: Alert Behavior During Therapy: WFL for tasks assessed/performed   PT - Cognitive impairments: No apparent impairments   Following commands: Intact      Cueing Cueing Techniques: Verbal cues         Pertinent Vitals/Pain Pain Assessment Pain Assessment: 0-10 Pain Score: 3  Pain Location: all over Pain Descriptors / Indicators: Discomfort Pain Intervention(s): Limited activity within patient's tolerance, Monitored during session, Repositioned     PT Goals (current goals can now be found in the care plan section) Acute Rehab PT Goals Patient Stated Goal: go to rehab Progress towards PT goals: Progressing toward goals    Frequency    Min 2X/week           Co-evaluation     PT goals addressed during session: Mobility/safety with mobility;Balance;Proper use of DME;Strengthening/ROM        AM-PAC PT "6 Clicks" Mobility   Outcome Measure  Help needed turning from your back to your side while in a flat bed without using bedrails?: A Little Help needed moving from lying on  your back to sitting on the side of a flat bed without using bedrails?: A Little Help needed moving to and from  a bed to a chair (including a wheelchair)?: A Little Help needed standing up from a chair using your arms (e.g., wheelchair or bedside chair)?: A Little Help needed to walk in hospital room?: A Little Help needed climbing 3-5 steps with a railing? : A Lot 6 Click Score: 17    End of Session   Activity Tolerance: Patient tolerated treatment well Patient left: in chair;with call bell/phone within reach;with chair alarm set Nurse Communication: Mobility status PT Visit Diagnosis: Muscle weakness (generalized) (M62.81);History of falling (Z91.81);Unsteadiness on feet (R26.81);Difficulty in walking, not elsewhere classified (R26.2);Adult, failure to thrive (R62.7);Other abnormalities of gait and mobility (R26.89);Pain Pain - part of body: Leg;Ankle and joints of foot     Time: 1610-9604 PT Time Calculation (min) (ACUTE ONLY): 17 min  Charges:    $Therapeutic Activity: 8-22 mins PT General Charges $$ ACUTE PT VISIT: 1 Visit                     Jetta Lout PTA 07/07/23, 8:59 AM

## 2023-07-07 NOTE — Progress Notes (Signed)
 Occupational Therapy Treatment Patient Details Name: Sheila Lang MRN: [id number] DOB: 1965/08/10 Today's Date: 07/07/2023   History of present illness Pt is a 58 y/o F admitted on 06/28/23 after presenting with c/o weakness, fall, burning on urination. Pt is being treated for UTI. PMH: lupus, arthritis with finger deformity, anemia, depression, severe protein calorie malnutrition   OT comments  Pt seen for OT session focused on bathing and dressing. Pt tolerated well, requiring grossly MOD A for UB and LB bathing and dressing during session. VC for hand placement during transfers with improvement noted in assist with 2nd transfer. Pt's arthritis in both hands makes it difficult for her to complete [facility] ADL and IADL tasks. Pt required set up for bathing and lunch afterwards. Pt progressing, continues to benefit.       If plan is discharge home, recommend the following:  A little help with walking and/or transfers;Help with stairs or ramp for entrance;A lot of help with bathing/dressing/bathroom;Assistance with cooking/housework;Assist for transportation   Equipment Recommendations  BSC/3in1    Recommendations for Other Services      Precautions / Restrictions Precautions Precautions: Fall Recall of Precautions/Restrictions: Intact Restrictions Weight Bearing Restrictions Per Provider Order: No       Mobility Bed Mobility               General bed mobility comments: Pt was in recliner pre/post session    Transfers Overall transfer level: Needs assistance Equipment used: Rolling walker (2 wheels) Transfers: Sit to/from Stand Sit to Stand: Contact guard assist, Min assist           General transfer comment: MIN A from recliner with VC for hand placement and improved to CGA from shower chair with VC for hand placement     Balance Overall balance assessment: Needs assistance Sitting-balance support: Feet supported Sitting balance-Leahy Scale: Good     Standing  balance support: Bilateral upper extremity supported, During functional activity, Reliant on assistive device for balance Standing balance-Leahy Scale: Good                             ADL either performed or assessed with clinical judgement   ADL Overall ADL's : Needs assistance/impaired         Upper Body Bathing: Sitting;Moderate assistance   Lower Body Bathing: Moderate assistance;Minimal assistance;Sitting/lateral leans   Upper Body Dressing : Sitting;Minimal assistance   Lower Body Dressing: Sitting/lateral leans;Maximal assistance Lower Body Dressing Details (indicate cue type and reason): MAX A to doff socks and don new ones               General ADL Comments: CGA with RW to amb to the bathroom, CGA for transfers to/from shower chair    Extremity/Trunk Assessment              Vision       Perception     Praxis     Communication Communication Communication: No apparent difficulties   Cognition Arousal: Alert Behavior During Therapy: WFL for tasks assessed/performed Cognition: No apparent impairments                               Following commands: Intact Following commands impaired: Follows multi-step commands with increased time      Cueing   Cueing Techniques: Verbal cues  Exercises Other Exercises Other Exercises: Pt educated in AE and adaptive strategies for bathing  and dressing while completing seated shower.    Shoulder Instructions       General Comments      Pertinent Vitals/ Pain       Pain Assessment Pain Assessment: No/denies pain  Home Living                                          Prior Functioning/Environment              Frequency  Min 1X/week        Progress Toward Goals  OT Goals(current goals can now be found in the care plan section)  Progress towards OT goals: Progressing toward goals  Acute Rehab OT Goals Patient Stated Goal: go to rehab OT Goal  Formulation: With patient Time For Goal Achievement: 07/13/23 Potential to Achieve Goals: Good  Plan      Co-evaluation        PT goals addressed during session: Mobility/safety with mobility;Balance;Proper use of DME;Strengthening/ROM        AM-PAC OT "6 Clicks" Daily Activity     Outcome Measure   Help from another person eating meals?: None (needs set up) Help from another person taking care of personal grooming?: A Little Help from another person toileting, which includes using toliet, bedpan, or urinal?: A Little Help from another person bathing (including washing, rinsing, drying)?: A Lot Help from another person to put on and taking off regular upper body clothing?: A Little Help from another person to put on and taking off regular lower body clothing?: A Lot 6 Click Score: 17    End of Session Equipment Utilized During Treatment: Rolling walker (2 wheels)  OT Visit Diagnosis: Other abnormalities of gait and mobility (R26.89);Muscle weakness (generalized) (M62.81)   Activity Tolerance Patient tolerated treatment well   Patient Left in chair;with call bell/phone within reach;with chair alarm set   Nurse Communication Other (comment) (unit secretary - pt requesting nurse tech assist with applying lotion later)        Time: 1444-1530 OT Time Calculation (min): 46 min  Charges: OT General Charges $OT Visit: 1 Visit OT Treatments $Self Care/Home Management : 38-52 mins  Arman Filter., MPH, MS, OTR/L ascom [phone] 07/07/23, 3:57 PM
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
## 2023-07-08 DIAGNOSIS — R627 Adult failure to thrive: Secondary | ICD-10-CM | POA: Diagnosis not present

## 2023-07-08 DIAGNOSIS — F32A Depression, unspecified: Secondary | ICD-10-CM | POA: Diagnosis not present

## 2023-07-08 DIAGNOSIS — M79642 Pain in left hand: Secondary | ICD-10-CM | POA: Diagnosis not present

## 2023-07-08 DIAGNOSIS — M129 Arthropathy, unspecified: Secondary | ICD-10-CM | POA: Diagnosis not present

## 2023-07-08 DIAGNOSIS — E43 Unspecified severe protein-calorie malnutrition: Secondary | ICD-10-CM | POA: Diagnosis not present

## 2023-07-08 DIAGNOSIS — R41841 Cognitive communication deficit: Secondary | ICD-10-CM | POA: Diagnosis not present

## 2023-07-08 DIAGNOSIS — Y92009 Unspecified place in unspecified non-institutional (private) residence as the place of occurrence of the external cause: Secondary | ICD-10-CM | POA: Diagnosis not present

## 2023-07-08 DIAGNOSIS — M329 Systemic lupus erythematosus, unspecified: Secondary | ICD-10-CM | POA: Diagnosis not present

## 2023-07-08 DIAGNOSIS — M6281 Muscle weakness (generalized): Secondary | ICD-10-CM | POA: Diagnosis not present

## 2023-07-08 DIAGNOSIS — D649 Anemia, unspecified: Secondary | ICD-10-CM | POA: Diagnosis not present

## 2023-07-08 DIAGNOSIS — N390 Urinary tract infection, site not specified: Secondary | ICD-10-CM | POA: Diagnosis not present

## 2023-07-08 DIAGNOSIS — R64 Cachexia: Secondary | ICD-10-CM | POA: Diagnosis not present

## 2023-07-08 DIAGNOSIS — Z9181 History of falling: Secondary | ICD-10-CM | POA: Diagnosis not present

## 2023-07-08 DIAGNOSIS — M20099 Other deformity of finger(s), unspecified finger(s): Secondary | ICD-10-CM | POA: Diagnosis not present

## 2023-07-08 DIAGNOSIS — W19XXXA Unspecified fall, initial encounter: Secondary | ICD-10-CM | POA: Diagnosis not present

## 2023-07-08 DIAGNOSIS — N3000 Acute cystitis without hematuria: Secondary | ICD-10-CM | POA: Diagnosis not present

## 2023-07-08 DIAGNOSIS — M79641 Pain in right hand: Secondary | ICD-10-CM | POA: Diagnosis not present
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
## 2023-07-08 NOTE — TOC Transition Note (Signed)
 Transition of Care [facility]) - Discharge Note   Patient Details  Name: Sheila Lang MRN: [id number] Date of Birth: 04/28/1965  [facility]) CM/SW Contact:  Cherre Blanc, RN Phone Number: 07/08/2023, 11:26 AM   Clinical Narrative:    Patient is medically clear to discharge to [facility] for inpatient rehab. TOC spoke with the patient's daughter [name] and she is in agreement with the discharge plan. Transportation to [facility] arranged via [employer]  Nurse to call report to [phone]  Final next level of care: Skilled Nursing Facility Barriers to Discharge: Insurance Authorization, SNF Pending bed offer   Patient Goals and CMS Choice            Discharge Placement                       Discharge Plan and Services Additional resources added to the After Visit Summary for                                       Social Drivers of Health (SDOH) Interventions SDOH Screenings   Food Insecurity: No Food Insecurity (06/28/2023)  Recent Concern: Food Insecurity - Food Insecurity Present (05/13/2023)  Housing: Low Risk  (06/28/2023)  Transportation Needs: No Transportation Needs (06/28/2023)  Recent Concern: Transportation Needs - Unmet Transportation Needs (05/13/2023)  Utilities: Not At Risk (06/28/2023)  Alcohol Screen: Low Risk  (05/23/2019)  Depression (PHQ2-9): Low Risk  (08/20/2020)  Social Connections: Moderately Integrated (06/28/2023)  Tobacco Use: Low Risk  (06/28/2023)     Readmission Risk Interventions     No data to display

## 2023-07-08 NOTE — Progress Notes (Signed)
 MEWS Progress Note  Patient Details Name: Sheila Lang MRN: [id number] DOB: October 14, 1965 Today's Date: 07/08/2023   MEWS Flowsheet Documentation:  Assess: MEWS Score Temp: 98.3 F (36.8 C) BP: 90/61 MAP (mmHg): 72 Pulse Rate: (!) 111 Resp: 17 Level of Consciousness: Alert SpO2: 100 % O2 Device: Room Air Assess: MEWS Score MEWS Temp: 0 MEWS Systolic: 1 MEWS Pulse: 2 MEWS RR: 0 MEWS LOC: 0 MEWS Score: 3 MEWS Score Color: Yellow Assess: SIRS CRITERIA SIRS Temperature : 0 SIRS Respirations : 0 SIRS Pulse: 1 SIRS WBC: 0 SIRS Score Sum : 1 SIRS Temperature : 0 SIRS Pulse: 1 SIRS Respirations : 0 SIRS WBC: 0 SIRS Score Sum : 1 Assess: if the MEWS score is Yellow or Red Were vital signs accurate and taken at a resting state?: Yes Does the patient meet 2 or more of the SIRS criteria?: No MEWS guidelines implemented : Yes, yellow Treat MEWS Interventions: Considered administering scheduled or prn medications/treatments as ordered Take Vital Signs Increase Vital Sign Frequency : Yellow: Q2hr x1, continue Q4hrs until patient remains green for 12hrs Escalate MEWS: Escalate: Yellow: Discuss with charge nurse and consider notifying provider and/or RRT Notify: Charge Nurse/RN Name of Charge Nurse/RN Notified: Tanda Rockers 07/08/2023, 5:24 AM

## 2023-07-08 NOTE — Plan of Care (Signed)
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
## 2023-07-21 DIAGNOSIS — Z9181 History of falling: Secondary | ICD-10-CM | POA: Diagnosis not present

## 2023-07-21 DIAGNOSIS — M79642 Pain in left hand: Secondary | ICD-10-CM | POA: Diagnosis not present

## 2023-07-21 DIAGNOSIS — D649 Anemia, unspecified: Secondary | ICD-10-CM | POA: Diagnosis not present

## 2023-07-21 DIAGNOSIS — E43 Unspecified severe protein-calorie malnutrition: Secondary | ICD-10-CM | POA: Diagnosis not present

## 2023-07-21 DIAGNOSIS — M79641 Pain in right hand: Secondary | ICD-10-CM | POA: Diagnosis not present

## 2023-07-21 DIAGNOSIS — F32A Depression, unspecified: Secondary | ICD-10-CM | POA: Diagnosis not present

## 2023-07-21 DIAGNOSIS — R64 Cachexia: Secondary | ICD-10-CM | POA: Diagnosis not present

## 2023-07-21 DIAGNOSIS — R627 Adult failure to thrive: Secondary | ICD-10-CM | POA: Diagnosis not present

## 2023-07-21 DIAGNOSIS — N390 Urinary tract infection, site not specified: Secondary | ICD-10-CM | POA: Diagnosis not present

## 2023-07-21 DIAGNOSIS — M329 Systemic lupus erythematosus, unspecified: Secondary | ICD-10-CM | POA: Diagnosis not present

## 2023-07-21 DIAGNOSIS — R41841 Cognitive communication deficit: Secondary | ICD-10-CM | POA: Diagnosis not present

## 2023-07-21 DIAGNOSIS — M20099 Other deformity of finger(s), unspecified finger(s): Secondary | ICD-10-CM | POA: Diagnosis not present

## 2023-07-21 DIAGNOSIS — M6281 Muscle weakness (generalized): Secondary | ICD-10-CM | POA: Diagnosis not present

## 2023-07-21 DIAGNOSIS — M129 Arthropathy, unspecified: Secondary | ICD-10-CM | POA: Diagnosis not present

## 2023-08-11 ENCOUNTER — Inpatient Hospital Stay
Admission: EM | Admit: 2023-08-11 | Discharge: 2023-08-24 | DRG: 149 | Disposition: A | Discharge status: Home Health | Attending: Family Medicine | Admitting: Family Medicine

## 2023-08-11 ENCOUNTER — Observation Stay

## 2023-08-11 ENCOUNTER — Other Ambulatory Visit: Payer: Self-pay

## 2023-08-11 ENCOUNTER — Emergency Department

## 2023-08-11 DIAGNOSIS — Z681 Body mass index (BMI) 19 or less, adult: Secondary | ICD-10-CM

## 2023-08-11 DIAGNOSIS — I959 Hypotension, unspecified: Secondary | ICD-10-CM | POA: Diagnosis not present

## 2023-08-11 DIAGNOSIS — R531 Weakness: Secondary | ICD-10-CM | POA: Diagnosis not present

## 2023-08-11 DIAGNOSIS — M0609 Rheumatoid arthritis without rheumatoid factor, multiple sites: Secondary | ICD-10-CM | POA: Diagnosis present

## 2023-08-11 DIAGNOSIS — E43 Unspecified severe protein-calorie malnutrition: Secondary | ICD-10-CM | POA: Diagnosis present

## 2023-08-11 DIAGNOSIS — E162 Hypoglycemia, unspecified: Secondary | ICD-10-CM | POA: Diagnosis not present

## 2023-08-11 DIAGNOSIS — H6993 Unspecified Eustachian tube disorder, bilateral: Secondary | ICD-10-CM | POA: Diagnosis present

## 2023-08-11 DIAGNOSIS — E039 Hypothyroidism, unspecified: Secondary | ICD-10-CM | POA: Diagnosis present

## 2023-08-11 DIAGNOSIS — Z5889 Other problems related to physical environment: Secondary | ICD-10-CM

## 2023-08-11 DIAGNOSIS — Z560 Unemployment, unspecified: Secondary | ICD-10-CM

## 2023-08-11 DIAGNOSIS — J479 Bronchiectasis, uncomplicated: Secondary | ICD-10-CM | POA: Diagnosis present

## 2023-08-11 DIAGNOSIS — J329 Chronic sinusitis, unspecified: Secondary | ICD-10-CM | POA: Diagnosis not present

## 2023-08-11 DIAGNOSIS — N3001 Acute cystitis with hematuria: Secondary | ICD-10-CM

## 2023-08-11 DIAGNOSIS — R29818 Other symptoms and signs involving the nervous system: Secondary | ICD-10-CM | POA: Diagnosis not present

## 2023-08-11 DIAGNOSIS — Z5948 Other specified lack of adequate food: Secondary | ICD-10-CM

## 2023-08-11 DIAGNOSIS — Z596 Low income: Secondary | ICD-10-CM

## 2023-08-11 DIAGNOSIS — Z8261 Family history of arthritis: Secondary | ICD-10-CM

## 2023-08-11 DIAGNOSIS — F32A Depression, unspecified: Secondary | ICD-10-CM | POA: Diagnosis present

## 2023-08-11 DIAGNOSIS — R42 Dizziness and giddiness: Secondary | ICD-10-CM | POA: Diagnosis not present

## 2023-08-11 DIAGNOSIS — Z79890 Hormone replacement therapy (postmenopausal): Secondary | ICD-10-CM

## 2023-08-11 DIAGNOSIS — H8113 Benign paroxysmal vertigo, bilateral: Principal | ICD-10-CM | POA: Diagnosis present

## 2023-08-11 DIAGNOSIS — M329 Systemic lupus erythematosus, unspecified: Secondary | ICD-10-CM | POA: Diagnosis present

## 2023-08-11 DIAGNOSIS — Z609 Problem related to social environment, unspecified: Secondary | ICD-10-CM | POA: Diagnosis present

## 2023-08-11 DIAGNOSIS — R64 Cachexia: Secondary | ICD-10-CM | POA: Diagnosis present

## 2023-08-11 DIAGNOSIS — L89151 Pressure ulcer of sacral region, stage 1: Secondary | ICD-10-CM | POA: Diagnosis present

## 2023-08-11 DIAGNOSIS — Z5941 Food insecurity: Secondary | ICD-10-CM

## 2023-08-11 DIAGNOSIS — F4329 Adjustment disorder with other symptoms: Secondary | ICD-10-CM | POA: Diagnosis present

## 2023-08-11 DIAGNOSIS — R627 Adult failure to thrive: Secondary | ICD-10-CM | POA: Diagnosis present

## 2023-08-11 DIAGNOSIS — Z79899 Other long term (current) drug therapy: Secondary | ICD-10-CM

## 2023-08-11 DIAGNOSIS — E11649 Type 2 diabetes mellitus with hypoglycemia without coma: Secondary | ICD-10-CM | POA: Diagnosis not present

## 2023-08-11 DIAGNOSIS — R54 Age-related physical debility: Secondary | ICD-10-CM | POA: Diagnosis present

## 2023-08-11 DIAGNOSIS — Z7952 Long term (current) use of systemic steroids: Secondary | ICD-10-CM

## 2023-08-11 LAB — CBC WITH DIFFERENTIAL/PLATELET
Abs Immature Granulocytes: 0.02 10*3/uL (ref 0.00–0.07)
Basophils Absolute: 0 10*3/uL (ref 0.0–0.1)
Basophils Relative: 0 %
Eosinophils Absolute: 0.2 10*3/uL (ref 0.0–0.5)
Eosinophils Relative: 5 %
HCT: 34.3 % — ABNORMAL LOW (ref 36.0–46.0)
Hemoglobin: 10.3 g/dL — ABNORMAL LOW (ref 12.0–15.0)
Immature Granulocytes: 1 %
Lymphocytes Relative: 29 %
Lymphs Abs: 0.9 10*3/uL (ref 0.7–4.0)
MCH: 29 pg (ref 26.0–34.0)
MCHC: 30 g/dL (ref 30.0–36.0)
MCV: 96.6 fL (ref 80.0–100.0)
Monocytes Absolute: 0.3 10*3/uL (ref 0.1–1.0)
Monocytes Relative: 11 %
Neutro Abs: 1.7 10*3/uL (ref 1.7–7.7)
Neutrophils Relative %: 54 %
Platelets: 301 10*3/uL (ref 150–400)
RBC: 3.55 MIL/uL — ABNORMAL LOW (ref 3.87–5.11)
RDW: 14.4 % (ref 11.5–15.5)
WBC: 3.1 10*3/uL — ABNORMAL LOW (ref 4.0–10.5)
nRBC: 0 % (ref 0.0–0.2)

## 2023-08-11 LAB — CBG MONITORING, ED
Glucose-Capillary: 33 mg/dL — CL (ref 70–99)
Glucose-Capillary: 45 mg/dL — ABNORMAL LOW (ref 70–99)
Glucose-Capillary: 92 mg/dL (ref 70–99)
Glucose-Capillary: 79 mg/dL (ref 70–99)

## 2023-08-11 LAB — COMPREHENSIVE METABOLIC PANEL WITH GFR
ALT: 21 U/L (ref 0–44)
AST: 61 U/L — ABNORMAL HIGH (ref 15–41)
Albumin: 2.5 g/dL — ABNORMAL LOW (ref 3.5–5.0)
Alkaline Phosphatase: 46 U/L (ref 38–126)
Anion gap: 9 (ref 5–15)
BUN: 24 mg/dL — ABNORMAL HIGH (ref 6–20)
CO2: 24 mmol/L (ref 22–32)
Calcium: 9.6 mg/dL (ref 8.9–10.3)
Chloride: 104 mmol/L (ref 98–111)
Creatinine, Ser: 0.73 mg/dL (ref 0.44–1.00)
GFR, Estimated: >60 mL/min (ref >60)
Glucose, Bld: 58 mg/dL — ABNORMAL LOW (ref 70–99)
Potassium: 4.2 mmol/L (ref 3.5–5.1)
Sodium: 137 mmol/L (ref 135–145)
Total Bilirubin: 0.7 mg/dL (ref 0.0–1.2)
Total Protein: 8 g/dL (ref 6.5–8.1)

## 2023-08-11 LAB — BASIC METABOLIC PANEL WITH GFR
Anion gap: 12 (ref 5–15)
BUN: 22 mg/dL — ABNORMAL HIGH (ref 6–20)
CO2: 22 mmol/L (ref 22–32)
Calcium: 9.8 mg/dL (ref 8.9–10.3)
Chloride: 103 mmol/L (ref 98–111)
Creatinine, Ser: 0.61 mg/dL (ref 0.44–1.00)
GFR, Estimated: >60 mL/min (ref >60)
Glucose, Bld: 79 mg/dL (ref 70–99)
Potassium: 4.3 mmol/L (ref 3.5–5.1)
Sodium: 137 mmol/L (ref 135–145)

## 2023-08-11 LAB — URINALYSIS, COMPLETE (UACMP) WITH MICROSCOPIC
Bacteria, UA: NONE SEEN
Bilirubin Urine: NEGATIVE
Glucose, UA: NEGATIVE mg/dL
Hgb urine dipstick: NEGATIVE
Ketones, ur: 5 mg/dL — AB
Nitrite: NEGATIVE
Protein, ur: NEGATIVE mg/dL
Specific Gravity, Urine: 1.017 (ref 1.005–1.030)
pH: 5 (ref 5.0–8.0)

## 2023-08-11 LAB — VITAMIN D 25 HYDROXY (VIT D DEFICIENCY, FRACTURES): Vit D, 25-Hydroxy: 44.44 ng/mL (ref 30–100)

## 2023-08-11 LAB — FOLATE: Folate: >40 ng/mL (ref >5.9)

## 2023-08-11 LAB — TROPONIN I (HIGH SENSITIVITY)
Troponin I (High Sensitivity): 16 ng/L (ref <18)
Troponin I (High Sensitivity): 18 ng/L — ABNORMAL HIGH (ref <18)

## 2023-08-11 LAB — MAGNESIUM: Magnesium: 1.9 mg/dL (ref 1.7–2.4)

## 2023-08-11 MED ORDER — LACTATED RINGERS IV SOLN: INTRAVENOUS | Status: AC

## 2023-08-11 MED ORDER — ENOXAPARIN SODIUM 30 MG/0.3ML IJ SOSY
30.0000 mg | PREFILLED_SYRINGE | INTRAMUSCULAR | Status: DC
  Administered 2023-08-12 – 2023-08-18 (×6): 30 mg via SUBCUTANEOUS
  Filled 2023-08-11 (×11): qty 0.3

## 2023-08-11 MED ORDER — SODIUM CHLORIDE 0.9% FLUSH
3.0000 mL | Freq: Two times a day (BID) | INTRAVENOUS | Status: DC
  Administered 2023-08-12 – 2023-08-24 (×24): 3 mL via INTRAVENOUS

## 2023-08-11 MED ORDER — ACETAMINOPHEN 650 MG RE SUPP: 650.0000 mg | Freq: Four times a day (QID) | RECTAL | Status: DC | PRN

## 2023-08-11 MED ORDER — ALLOPURINOL 100 MG PO TABS
100.0000 mg | ORAL_TABLET | Freq: Every day | ORAL | Status: DC
Start: 2023-08-12 — End: 2023-08-24
  Administered 2023-08-13 – 2023-08-24 (×10): 100 mg via ORAL
  Filled 2023-08-11 (×14): qty 1

## 2023-08-11 MED ORDER — FOLIC ACID 1 MG PO TABS
1.0000 mg | ORAL_TABLET | Freq: Every day | ORAL | Status: DC
  Administered 2023-08-11 – 2023-08-24 (×14): 1 mg via ORAL
  Filled 2023-08-11 (×14): qty 1

## 2023-08-11 MED ORDER — DEXTROSE 10 % IV SOLN
INTRAVENOUS | Status: DC
  Filled 2023-08-11: qty 1000

## 2023-08-11 MED ORDER — ONDANSETRON HCL 4 MG/2ML IJ SOLN: 4.0000 mg | Freq: Four times a day (QID) | INTRAMUSCULAR | Status: DC | PRN

## 2023-08-11 MED ORDER — SODIUM CHLORIDE 0.9 % IV BOLUS
500.0000 mL | Freq: Once | INTRAVENOUS | Status: AC
  Administered 2023-08-11: 500 mL via INTRAVENOUS

## 2023-08-11 MED ORDER — DEXTROSE 50 % IV SOLN: 1.0000 | Freq: Once | INTRAVENOUS | Status: DC

## 2023-08-11 MED ORDER — VITAMIN B-12 1000 MCG PO TABS
1000.0000 ug | ORAL_TABLET | Freq: Every day | ORAL | Status: DC
Start: 2023-08-11 — End: 2023-08-24
  Administered 2023-08-11 – 2023-08-24 (×14): 1000 ug via ORAL
  Filled 2023-08-11: qty 1
  Filled 2023-08-11 (×2): qty 2
  Filled 2023-08-11 (×11): qty 1

## 2023-08-11 MED ORDER — ADULT MULTIVITAMIN W/MINERALS CH
1.0000 | ORAL_TABLET | Freq: Every day | ORAL | Status: DC
  Administered 2023-08-12 – 2023-08-24 (×13): 1 via ORAL
  Filled 2023-08-11 (×14): qty 1

## 2023-08-11 MED ORDER — ONDANSETRON HCL 4 MG/2ML IJ SOLN
4.0000 mg | Freq: Once | INTRAMUSCULAR | Status: AC
  Administered 2023-08-11: 4 mg via INTRAVENOUS
  Filled 2023-08-11: qty 2

## 2023-08-11 MED ORDER — ONDANSETRON HCL 4 MG PO TABS
4.0000 mg | ORAL_TABLET | Freq: Four times a day (QID) | ORAL | Status: DC | PRN
  Administered 2023-08-17: 4 mg via ORAL
  Filled 2023-08-11: qty 1

## 2023-08-11 MED ORDER — ACETAMINOPHEN 325 MG PO TABS
650.0000 mg | ORAL_TABLET | Freq: Four times a day (QID) | ORAL | Status: DC | PRN
  Administered 2023-08-12 – 2023-08-18 (×8): 650 mg via ORAL
  Filled 2023-08-11 (×11): qty 2

## 2023-08-11 MED ORDER — PANTOPRAZOLE SODIUM 40 MG PO TBEC
40.0000 mg | DELAYED_RELEASE_TABLET | Freq: Every day | ORAL | Status: DC
  Administered 2023-08-13 – 2023-08-24 (×10): 40 mg via ORAL
  Filled 2023-08-11 (×13): qty 1

## 2023-08-11 MED ORDER — MECLIZINE HCL 25 MG PO TABS
25.0000 mg | ORAL_TABLET | Freq: Once | ORAL | Status: AC
  Administered 2023-08-11: 25 mg via ORAL
  Filled 2023-08-11: qty 1

## 2023-08-11 MED ORDER — THIAMINE MONONITRATE 100 MG PO TABS
100.0000 mg | ORAL_TABLET | Freq: Every day | ORAL | Status: DC
  Administered 2023-08-11 – 2023-08-24 (×15): 100 mg via ORAL
  Filled 2023-08-11 (×14): qty 1

## 2023-08-11 MED ORDER — PREDNISONE 10 MG PO TABS
5.0000 mg | ORAL_TABLET | Freq: Every day | ORAL | Status: DC
  Administered 2023-08-12 – 2023-08-24 (×13): 5 mg via ORAL
  Filled 2023-08-11 (×13): qty 1

## 2023-08-11 MED ORDER — SODIUM CHLORIDE 0.9 % IV SOLN: 1.0000 g | Freq: Once | INTRAVENOUS | Status: DC

## 2023-08-11 MED ORDER — SENNOSIDES-DOCUSATE SODIUM 8.6-50 MG PO TABS: 1.0000 | ORAL_TABLET | Freq: Every evening | ORAL | Status: DC | PRN

## 2023-08-11 NOTE — ED Notes (Signed)
 Pt able to tolerate boost without n/v.

## 2023-08-11 NOTE — ED Notes (Addendum)
 Pt transported to MRI at this time. All of the pts belongings were moved to room 32, along with the MRI wheelchair, since pt was uncomfortable sitting up.
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
## 2023-08-11 NOTE — Assessment & Plan Note (Signed)
Continue home prednisone 5 mg daily.

## 2023-08-11 NOTE — ED Notes (Signed)
 Pt given dinner tray.
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
## 2023-08-11 NOTE — Assessment & Plan Note (Signed)
 Patient is presenting with 1 day history of dizziness that is worsened with movement.  Differential includes benign positional vertigo, versus severe dehydration.  Lower on the differential is a posterior CVA.  - Telemetry monitoring - PT/OT - MRI without contrast - IV fluids

## 2023-08-11 NOTE — ED Notes (Signed)
 Patient transported to CT

## 2023-08-11 NOTE — Assessment & Plan Note (Signed)
 Likely due to poor p.o. intake, with essentially no reserve.  Blood sugar has improved with oral intake.  - CBG every 4 hours for 24 hours
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
## 2023-08-12 ENCOUNTER — Observation Stay

## 2023-08-12 ENCOUNTER — Encounter: Payer: Self-pay | Admitting: Internal Medicine

## 2023-08-12 DIAGNOSIS — Z596 Low income: Secondary | ICD-10-CM | POA: Diagnosis not present

## 2023-08-12 DIAGNOSIS — R64 Cachexia: Secondary | ICD-10-CM | POA: Diagnosis not present

## 2023-08-12 DIAGNOSIS — E039 Hypothyroidism, unspecified: Secondary | ICD-10-CM | POA: Diagnosis not present

## 2023-08-12 DIAGNOSIS — H6993 Unspecified Eustachian tube disorder, bilateral: Secondary | ICD-10-CM | POA: Diagnosis not present

## 2023-08-12 DIAGNOSIS — F32A Depression, unspecified: Secondary | ICD-10-CM | POA: Diagnosis not present

## 2023-08-12 DIAGNOSIS — E162 Hypoglycemia, unspecified: Secondary | ICD-10-CM | POA: Diagnosis not present

## 2023-08-12 DIAGNOSIS — R627 Adult failure to thrive: Secondary | ICD-10-CM | POA: Diagnosis not present

## 2023-08-12 DIAGNOSIS — L89151 Pressure ulcer of sacral region, stage 1: Secondary | ICD-10-CM | POA: Diagnosis not present

## 2023-08-12 DIAGNOSIS — M329 Systemic lupus erythematosus, unspecified: Secondary | ICD-10-CM | POA: Diagnosis not present

## 2023-08-12 DIAGNOSIS — E43 Unspecified severe protein-calorie malnutrition: Secondary | ICD-10-CM | POA: Diagnosis not present

## 2023-08-12 DIAGNOSIS — H8113 Benign paroxysmal vertigo, bilateral: Secondary | ICD-10-CM | POA: Diagnosis not present

## 2023-08-12 DIAGNOSIS — I3139 Other pericardial effusion (noninflammatory): Secondary | ICD-10-CM | POA: Diagnosis not present

## 2023-08-12 DIAGNOSIS — Z79890 Hormone replacement therapy (postmenopausal): Secondary | ICD-10-CM | POA: Diagnosis not present

## 2023-08-12 DIAGNOSIS — M0609 Rheumatoid arthritis without rheumatoid factor, multiple sites: Secondary | ICD-10-CM | POA: Diagnosis not present

## 2023-08-12 DIAGNOSIS — Z8261 Family history of arthritis: Secondary | ICD-10-CM | POA: Diagnosis not present

## 2023-08-12 DIAGNOSIS — J984 Other disorders of lung: Secondary | ICD-10-CM | POA: Diagnosis not present

## 2023-08-12 DIAGNOSIS — Z609 Problem related to social environment, unspecified: Secondary | ICD-10-CM | POA: Diagnosis not present

## 2023-08-12 DIAGNOSIS — Z5941 Food insecurity: Secondary | ICD-10-CM | POA: Diagnosis not present

## 2023-08-12 DIAGNOSIS — Z7952 Long term (current) use of systemic steroids: Secondary | ICD-10-CM | POA: Diagnosis not present

## 2023-08-12 DIAGNOSIS — J479 Bronchiectasis, uncomplicated: Secondary | ICD-10-CM | POA: Diagnosis not present

## 2023-08-12 DIAGNOSIS — Z560 Unemployment, unspecified: Secondary | ICD-10-CM | POA: Diagnosis not present

## 2023-08-12 DIAGNOSIS — E11649 Type 2 diabetes mellitus with hypoglycemia without coma: Secondary | ICD-10-CM | POA: Diagnosis not present

## 2023-08-12 DIAGNOSIS — Z79899 Other long term (current) drug therapy: Secondary | ICD-10-CM | POA: Diagnosis not present

## 2023-08-12 DIAGNOSIS — Z681 Body mass index (BMI) 19 or less, adult: Secondary | ICD-10-CM | POA: Diagnosis not present

## 2023-08-12 DIAGNOSIS — Z5889 Other problems related to physical environment: Secondary | ICD-10-CM | POA: Diagnosis not present

## 2023-08-12 DIAGNOSIS — N3001 Acute cystitis with hematuria: Secondary | ICD-10-CM | POA: Diagnosis not present

## 2023-08-12 DIAGNOSIS — F4329 Adjustment disorder with other symptoms: Secondary | ICD-10-CM | POA: Diagnosis not present

## 2023-08-12 DIAGNOSIS — R42 Dizziness and giddiness: Secondary | ICD-10-CM | POA: Diagnosis not present

## 2023-08-12 DIAGNOSIS — Z5948 Other specified lack of adequate food: Secondary | ICD-10-CM | POA: Diagnosis not present

## 2023-08-12 DIAGNOSIS — R54 Age-related physical debility: Secondary | ICD-10-CM | POA: Diagnosis not present

## 2023-08-12 LAB — COMPREHENSIVE METABOLIC PANEL WITH GFR
ALT: 21 U/L (ref 0–44)
AST: 59 U/L — ABNORMAL HIGH (ref 15–41)
Albumin: 2.4 g/dL — ABNORMAL LOW (ref 3.5–5.0)
Alkaline Phosphatase: 46 U/L (ref 38–126)
Anion gap: 8 (ref 5–15)
BUN: 17 mg/dL (ref 6–20)
CO2: 26 mmol/L (ref 22–32)
Calcium: 9.9 mg/dL (ref 8.9–10.3)
Chloride: 100 mmol/L (ref 98–111)
Creatinine, Ser: 0.56 mg/dL (ref 0.44–1.00)
GFR, Estimated: >60 mL/min (ref >60)
Glucose, Bld: 89 mg/dL (ref 70–99)
Potassium: 4.1 mmol/L (ref 3.5–5.1)
Sodium: 134 mmol/L — ABNORMAL LOW (ref 135–145)
Total Bilirubin: 0.3 mg/dL (ref 0.0–1.2)
Total Protein: 7.7 g/dL (ref 6.5–8.1)

## 2023-08-12 LAB — BASIC METABOLIC PANEL WITH GFR
Anion gap: 5 (ref 5–15)
BUN: 19 mg/dL (ref 6–20)
CO2: 26 mmol/L (ref 22–32)
Calcium: 10 mg/dL (ref 8.9–10.3)
Chloride: 103 mmol/L (ref 98–111)
Creatinine, Ser: 0.71 mg/dL (ref 0.44–1.00)
GFR, Estimated: >60 mL/min (ref >60)
Glucose, Bld: 78 mg/dL (ref 70–99)
Potassium: 4.3 mmol/L (ref 3.5–5.1)
Sodium: 134 mmol/L — ABNORMAL LOW (ref 135–145)

## 2023-08-12 LAB — MAGNESIUM: Magnesium: 1.9 mg/dL (ref 1.7–2.4)

## 2023-08-12 LAB — GLUCOSE, CAPILLARY
Glucose-Capillary: 106 mg/dL — ABNORMAL HIGH (ref 70–99)
Glucose-Capillary: 129 mg/dL — ABNORMAL HIGH (ref 70–99)
Glucose-Capillary: 138 mg/dL — ABNORMAL HIGH (ref 70–99)
Glucose-Capillary: 78 mg/dL (ref 70–99)

## 2023-08-12 LAB — VITAMIN B12: Vitamin B-12: 304 pg/mL (ref 180–914)

## 2023-08-12 LAB — T4, FREE: Free T4: 1 ng/dL (ref 0.61–1.12)

## 2023-08-12 LAB — CBG MONITORING, ED: Glucose-Capillary: 72 mg/dL (ref 70–99)

## 2023-08-12 LAB — TSH: TSH: 16.29 u[IU]/mL — ABNORMAL HIGH (ref 0.350–4.500)

## 2023-08-12 MED ORDER — MECLIZINE HCL 25 MG PO TABS
25.0000 mg | ORAL_TABLET | Freq: Once | ORAL | Status: AC
  Administered 2023-08-12: 25 mg via ORAL
  Filled 2023-08-12: qty 1

## 2023-08-12 MED ORDER — MECLIZINE HCL 25 MG PO TABS
25.0000 mg | ORAL_TABLET | Freq: Three times a day (TID) | ORAL | Status: DC
  Administered 2023-08-12 – 2023-08-19 (×20): 25 mg via ORAL
  Filled 2023-08-12 (×22): qty 1

## 2023-08-12 MED ORDER — ENSURE ENLIVE PO LIQD
237.0000 mL | Freq: Two times a day (BID) | ORAL | Status: DC
  Administered 2023-08-12 – 2023-08-24 (×19): 237 mL via ORAL

## 2023-08-12 NOTE — ED Notes (Signed)
 Pt assisted in being placed on bed pan. Pt able to lift hips up but refrained from moving head.
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
## 2023-08-12 NOTE — ED Notes (Signed)
 Pt resting comfortably in bed at this time. ABCs intact. RR even and unlabored. Pt in NAD. Bed in lowest locked position. Call bell in reach.
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
## 2023-08-12 NOTE — ED Notes (Signed)
 Pt transported to room, awaiting inpatient room for continuation of workup. Pt states that recently she started to become very dizzy with the room spinning immensely around her with the slightest movement of her head. Pt at this time is afraid to move or reposition in bed on her own because of the spinning sensation she is experiencing. Otherwise, pt denies any further complaints. Pt ABCs intact. RR even and unlabored. Bed in lowest locked position. Call bell in reach. Denies needs at this time.    Past Medical History:  Diagnosis Date   Anemia    Arthritis    Depression    Lupus (systemic lupus erythematosus) (HCC)

## 2023-08-12 NOTE — Progress Notes (Signed)
 OT Cancellation Note  Patient Details Name: Sheila Lang MRN: [id number] DOB: 07/29/1965   Cancelled Treatment:    Reason Eval/Treat Not Completed: Other (comment). Pt politely requests to complete OT eval tomorrow due to continued onset of dizziness with slight positional changes. Lunch recently delivered to pt's room, OT opens containers for pt and assists with meal setup due to bilateral [facility] impairments. OT will re-attempt eval tomorrow as requested.   Beaumont Austad L. Kenzlie Disch, OTR/L  08/12/23, 2:08 PM

## 2023-08-12 NOTE — Plan of Care (Signed)
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
## 2023-08-12 NOTE — ED Notes (Signed)
 Pt ABCs intact. RR even and unlabored. Pt in NAD. Bed in lowest locked position. Call bell in reach. Pt feels like her symptoms are getting worse and requesting another dose of Antivert . Inpatient provider alerted for medication order.

## 2023-08-12 NOTE — Progress Notes (Signed)
   08/12/23 1500  Spiritual Encounters  Type of Visit Initial  Care provided to: Patient  Conversation partners present during encounter Nurse  Reason for visit Advance directives  OnCall Visit No   Chaplain responded to Consult for AD.  Chaplain went to the room and provided paperwork and explained it.  Chaplain told the patient to tell the Nurse when she's ready for a notary and witnesses and Nurse will contact the Chaplains.    Rev. Rana M. Nolon Baxter, M.Div. Chaplain Resident [facility]

## 2023-08-12 NOTE — ED Notes (Signed)
 Sacral pad placed on pt's buttocks to help prevent skin breakdown. Pt wanted to order breakfast. Pt provided phone and number so she could call.

## 2023-08-13 ENCOUNTER — Inpatient Hospital Stay

## 2023-08-13 DIAGNOSIS — R42 Dizziness and giddiness: Secondary | ICD-10-CM | POA: Diagnosis not present

## 2023-08-13 DIAGNOSIS — I3139 Other pericardial effusion (noninflammatory): Secondary | ICD-10-CM | POA: Diagnosis not present

## 2023-08-13 DIAGNOSIS — J984 Other disorders of lung: Secondary | ICD-10-CM | POA: Diagnosis not present

## 2023-08-13 LAB — CBC
HCT: 29.5 % — ABNORMAL LOW (ref 36.0–46.0)
Hemoglobin: 9.1 g/dL — ABNORMAL LOW (ref 12.0–15.0)
MCH: 28.7 pg (ref 26.0–34.0)
MCHC: 30.8 g/dL (ref 30.0–36.0)
MCV: 93.1 fL (ref 80.0–100.0)
Platelets: 322 10*3/uL (ref 150–400)
RBC: 3.17 MIL/uL — ABNORMAL LOW (ref 3.87–5.11)
RDW: 14.3 % (ref 11.5–15.5)
WBC: 3.8 10*3/uL — ABNORMAL LOW (ref 4.0–10.5)
nRBC: 0 % (ref 0.0–0.2)

## 2023-08-13 LAB — GLUCOSE, CAPILLARY
Glucose-Capillary: 109 mg/dL — ABNORMAL HIGH (ref 70–99)
Glucose-Capillary: 85 mg/dL (ref 70–99)
Glucose-Capillary: 97 mg/dL (ref 70–99)
Glucose-Capillary: 138 mg/dL — ABNORMAL HIGH (ref 70–99)

## 2023-08-13 LAB — PHOSPHORUS: Phosphorus: 4 mg/dL (ref 2.5–4.6)

## 2023-08-13 LAB — C-REACTIVE PROTEIN: CRP: 2 mg/dL — ABNORMAL HIGH (ref <1.0)

## 2023-08-13 LAB — SEDIMENTATION RATE: Sed Rate: 3 mm/h (ref 0–30)

## 2023-08-13 LAB — MAGNESIUM: Magnesium: 2 mg/dL (ref 1.7–2.4)

## 2023-08-13 MED ORDER — LEVOTHYROXINE SODIUM 50 MCG PO TABS
50.0000 ug | ORAL_TABLET | Freq: Every day | ORAL | Status: DC
  Administered 2023-08-14 – 2023-08-24 (×10): 50 ug via ORAL
  Filled 2023-08-13 (×11): qty 1

## 2023-08-13 MED ORDER — IOHEXOL 300 MG/ML  SOLN
75.0000 mL | Freq: Once | INTRAMUSCULAR | Status: AC | PRN
  Administered 2023-08-13: 75 mL via INTRAVENOUS
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
## 2023-08-14 DIAGNOSIS — R42 Dizziness and giddiness: Secondary | ICD-10-CM | POA: Diagnosis not present

## 2023-08-14 DIAGNOSIS — F32A Depression, unspecified: Secondary | ICD-10-CM | POA: Diagnosis not present

## 2023-08-14 DIAGNOSIS — M0609 Rheumatoid arthritis without rheumatoid factor, multiple sites: Secondary | ICD-10-CM | POA: Diagnosis not present

## 2023-08-14 DIAGNOSIS — E43 Unspecified severe protein-calorie malnutrition: Secondary | ICD-10-CM | POA: Diagnosis not present

## 2023-08-14 LAB — CBC
HCT: 34.1 % — ABNORMAL LOW (ref 36.0–46.0)
Hemoglobin: 10.4 g/dL — ABNORMAL LOW (ref 12.0–15.0)
MCH: 28.8 pg (ref 26.0–34.0)
MCHC: 30.5 g/dL (ref 30.0–36.0)
MCV: 94.5 fL (ref 80.0–100.0)
Platelets: 318 10*3/uL (ref 150–400)
RBC: 3.61 MIL/uL — ABNORMAL LOW (ref 3.87–5.11)
RDW: 14.3 % (ref 11.5–15.5)
WBC: 3.8 10*3/uL — ABNORMAL LOW (ref 4.0–10.5)
nRBC: 0 % (ref 0.0–0.2)

## 2023-08-14 LAB — COMPREHENSIVE METABOLIC PANEL WITH GFR
ALT: 23 U/L (ref 0–44)
AST: 47 U/L — ABNORMAL HIGH (ref 15–41)
Albumin: 2.7 g/dL — ABNORMAL LOW (ref 3.5–5.0)
Alkaline Phosphatase: 45 U/L (ref 38–126)
Anion gap: 7 (ref 5–15)
BUN: 23 mg/dL — ABNORMAL HIGH (ref 6–20)
CO2: 26 mmol/L (ref 22–32)
Calcium: 9.1 mg/dL (ref 8.9–10.3)
Chloride: 102 mmol/L (ref 98–111)
Creatinine, Ser: 0.5 mg/dL (ref 0.44–1.00)
GFR, Estimated: >60 mL/min (ref >60)
Glucose, Bld: 98 mg/dL (ref 70–99)
Potassium: 4.4 mmol/L (ref 3.5–5.1)
Sodium: 135 mmol/L (ref 135–145)
Total Bilirubin: 0.3 mg/dL (ref 0.0–1.2)
Total Protein: 8.3 g/dL — ABNORMAL HIGH (ref 6.5–8.1)

## 2023-08-14 LAB — PHOSPHORUS: Phosphorus: 3.6 mg/dL (ref 2.5–4.6)

## 2023-08-14 LAB — GLUCOSE, CAPILLARY
Glucose-Capillary: 129 mg/dL — ABNORMAL HIGH (ref 70–99)
Glucose-Capillary: 110 mg/dL — ABNORMAL HIGH (ref 70–99)
Glucose-Capillary: 96 mg/dL (ref 70–99)
Glucose-Capillary: 167 mg/dL — ABNORMAL HIGH (ref 70–99)

## 2023-08-14 LAB — VITAMIN B1: Vitamin B1 (Thiamine): 77 nmol/L (ref 66.5–200.0)

## 2023-08-14 LAB — MAGNESIUM: Magnesium: 2 mg/dL (ref 1.7–2.4)

## 2023-08-14 LAB — HCV AB W REFLEX TO QUANT PCR: HCV Ab: NONREACTIVE

## 2023-08-14 LAB — HCV INTERPRETATION
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
## 2023-08-15 DIAGNOSIS — R42 Dizziness and giddiness: Secondary | ICD-10-CM | POA: Diagnosis not present

## 2023-08-15 DIAGNOSIS — E43 Unspecified severe protein-calorie malnutrition: Secondary | ICD-10-CM | POA: Diagnosis not present

## 2023-08-15 DIAGNOSIS — M0609 Rheumatoid arthritis without rheumatoid factor, multiple sites: Secondary | ICD-10-CM | POA: Diagnosis not present

## 2023-08-15 DIAGNOSIS — F32A Depression, unspecified: Secondary | ICD-10-CM

## 2023-08-15 LAB — GLUCOSE, CAPILLARY
Glucose-Capillary: 80 mg/dL (ref 70–99)
Glucose-Capillary: 162 mg/dL — ABNORMAL HIGH (ref 70–99)
Glucose-Capillary: 120 mg/dL — ABNORMAL HIGH (ref 70–99)
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
## 2023-08-16 DIAGNOSIS — R42 Dizziness and giddiness: Secondary | ICD-10-CM | POA: Diagnosis not present

## 2023-08-16 DIAGNOSIS — M0609 Rheumatoid arthritis without rheumatoid factor, multiple sites: Secondary | ICD-10-CM | POA: Diagnosis not present

## 2023-08-16 DIAGNOSIS — E43 Unspecified severe protein-calorie malnutrition: Secondary | ICD-10-CM | POA: Diagnosis not present

## 2023-08-16 DIAGNOSIS — F32A Depression, unspecified: Secondary | ICD-10-CM | POA: Diagnosis not present

## 2023-08-16 LAB — GLUCOSE, CAPILLARY
Glucose-Capillary: 75 mg/dL (ref 70–99)
Glucose-Capillary: 91 mg/dL (ref 70–99)
Glucose-Capillary: 112 mg/dL — ABNORMAL HIGH (ref 70–99)
Glucose-Capillary: 125 mg/dL — ABNORMAL HIGH (ref 70–99)
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
## 2023-08-17 DIAGNOSIS — R42 Dizziness and giddiness: Secondary | ICD-10-CM | POA: Diagnosis not present

## 2023-08-17 LAB — GLUCOSE, CAPILLARY
Glucose-Capillary: 78 mg/dL (ref 70–99)
Glucose-Capillary: 112 mg/dL — ABNORMAL HIGH (ref 70–99)

## 2023-08-17 NOTE — Progress Notes (Signed)
 MEWS Progress Note  Patient Details Name: Sheila Lang MRN: [id number] DOB: 1965-08-27 Today's Date: 08/17/2023   MEWS Flowsheet Documentation:  Assess: MEWS Score Temp: 98.8 F (37.1 C) BP: (!) 98/57 MAP (mmHg): 69 Pulse Rate: (!) 108 ECG Heart Rate: (!) 112 Resp: 16 Level of Consciousness: Alert SpO2: 100 % O2 Device: Room Air Assess: MEWS Score MEWS Temp: 0 MEWS Systolic: 1 MEWS Pulse: 1 MEWS RR: 0 MEWS LOC: 0 MEWS Score: 2 MEWS Score Color: Yellow Assess: SIRS CRITERIA SIRS Temperature : 0 SIRS Respirations : 0 SIRS Pulse: 1 SIRS WBC: 0 SIRS Score Sum : 1 Assess: if the MEWS score is Yellow or Red Were vital signs accurate and taken at a resting state?: Yes Does the patient meet 2 or more of the SIRS criteria?: No MEWS guidelines implemented : Yes, yellow Treat MEWS Interventions: Considered administering scheduled or prn medications/treatments as ordered Take Vital Signs Increase Vital Sign Frequency : Yellow: Q2hr x1, continue Q4hrs until patient remains green for 12hrs Escalate MEWS: Escalate: Yellow: Discuss with charge nurse and consider notifying provider and/or RRT Notify: Charge Nurse/RN Name of Charge Nurse/RN Notified: Marni Sins 08/17/2023, 8:39 PM
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
## 2023-08-17 NOTE — Plan of Care (Signed)

## 2023-08-18 DIAGNOSIS — R42 Dizziness and giddiness: Secondary | ICD-10-CM | POA: Diagnosis not present

## 2023-08-18 LAB — COMPREHENSIVE METABOLIC PANEL WITH GFR
ALT: 39 U/L (ref 0–44)
AST: 63 U/L — ABNORMAL HIGH (ref 15–41)
Albumin: 2.7 g/dL — ABNORMAL LOW (ref 3.5–5.0)
Alkaline Phosphatase: 46 U/L (ref 38–126)
Anion gap: 10 (ref 5–15)
BUN: 29 mg/dL — ABNORMAL HIGH (ref 6–20)
CO2: 24 mmol/L (ref 22–32)
Calcium: 9 mg/dL (ref 8.9–10.3)
Chloride: 102 mmol/L (ref 98–111)
Creatinine, Ser: 0.58 mg/dL (ref 0.44–1.00)
GFR, Estimated: >60 mL/min (ref >60)
Glucose, Bld: 116 mg/dL — ABNORMAL HIGH (ref 70–99)
Potassium: 4.5 mmol/L (ref 3.5–5.1)
Sodium: 136 mmol/L (ref 135–145)
Total Bilirubin: 0.4 mg/dL (ref 0.0–1.2)
Total Protein: 8.1 g/dL (ref 6.5–8.1)

## 2023-08-18 LAB — MAGNESIUM: Magnesium: 2 mg/dL (ref 1.7–2.4)

## 2023-08-18 LAB — GLUCOSE, CAPILLARY
Glucose-Capillary: 66 mg/dL — ABNORMAL LOW (ref 70–99)
Glucose-Capillary: 115 mg/dL — ABNORMAL HIGH (ref 70–99)
Glucose-Capillary: 124 mg/dL — ABNORMAL HIGH (ref 70–99)

## 2023-08-18 LAB — PHOSPHORUS: Phosphorus: 4.4 mg/dL (ref 2.5–4.6)

## 2023-08-18 MED ORDER — IBUPROFEN 400 MG PO TABS
600.0000 mg | ORAL_TABLET | Freq: Once | ORAL | Status: AC
  Administered 2023-08-18: 600 mg via ORAL
  Filled 2023-08-18: qty 2

## 2023-08-18 NOTE — TOC Progression Note (Deleted)
 Transition of Care [facility]) - Progression Note    Patient Details  Name: Sheila Lang MRN: [id number] Date of Birth: [birth year]  Transition of Care [facility]) CM/SW Contact  Elsie Halo, RN Phone Number: 08/18/2023, 2:35 PM  Clinical Narrative:       Expected Discharge Plan: Home w Home Health Services Barriers to Discharge: Continued Medical Work up  Expected Discharge Plan and Services       Living arrangements for the past 2 months: Single Family Home                                       Social Determinants of Health (SDOH) Interventions SDOH Screenings   Food Insecurity: No Food Insecurity (08/12/2023)  Housing: Low Risk  (08/12/2023)  Transportation Needs: No Transportation Needs (08/12/2023)  Utilities: Not At Risk (08/12/2023)  Alcohol Screen: Low Risk  (05/23/2019)  Depression (PHQ2-9): Low Risk  (08/20/2020)  Social Connections: Moderately Integrated (06/28/2023)  Tobacco Use: Low Risk  (08/12/2023)    Readmission Risk Interventions     No data to display

## 2023-08-18 NOTE — Progress Notes (Signed)
   08/18/23 1330  Spiritual Encounters  Type of Visit Initial  Care provided to: Patient  Conversation partners present during encounter Nurse  Reason for visit Code  OnCall Visit No   Chaplain responded to an RRT to this room while rounding on the Unit.  No family present.  After the staff got patient stable, Chaplain went in to assess; however, patient declined spiritual care at this time.    Rev. Rana M. Nolon Baxter, M.Div. Chaplain Resident [facility]

## 2023-08-18 NOTE — Plan of Care (Signed)
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
## 2023-08-18 NOTE — Progress Notes (Signed)
 Mobility Specialist - Progress Note   08/18/23 1100  Mobility  Activity Refused mobility     Pt declined mobility; no reason specified. Will attempt another date/time.    Searcy Czech Mobility Specialist 08/18/23, 12:00 PM

## 2023-08-18 NOTE — Significant Event (Signed)
 Rapid Response Event Note   Reason for Call :  Near syncopal episode  Initial Focused Assessment:  Rapid response RN arrived in patient's room with patient lying in bed surrounded by MD, 1C RNs, 1C NT, and AC RN. 1C staff reports patient had been in chair after PT and reported to staff she felt poorly and like she might pass out. Staff reported she looked diaphoretic and checked her CBG which was 115. Staff then helped her back to bed.  Patient alert and reported feeling better in bed. BP 122/62 MAP 77, HR 76 regular. RR unlabored.   Interventions:  Tried pulse oximetry reading on patient's ear which showed 100% SpO2 on room air with a good pleth waveform.  Plan of Care:  Patient to remain on 1C for now. 1C staff to reach back out to rapid response team if any further needs. MD to review orders and make changes as needed and advised caution with further attempts at mobility.  Event Summary:   MD Notified: Dr. Sari Cunning Call Time: 13:49 Arrival Time: 13:51 End Time: 13:57  Damarco Keysor, Audie Bleacher, RN
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
## 2023-08-18 NOTE — TOC Progression Note (Addendum)
 Transition of Care [facility]) - Progression Note    Patient Details  Name: Sheila Lang MRN: [id number] Date of Birth: [birth year]  [facility] CM/SW Contact  Elsie Halo, RN Phone Number: 08/18/2023, 2:34 PM  Clinical Narrative:    Psychiatry deemed that a guardian is needed for medical decision making. TOC awaiting follow-up from APS.   TOC sent secure email to [email or web address] to request call to discuss guardianship.  TOC will continue to follow.   Expected Discharge Plan: Home w Home Health Services Barriers to Discharge: Continued Medical Work up  Expected Discharge Plan and Services       Living arrangements for the past 2 months: Single Family Home                                       Social Determinants of Health (SDOH) Interventions SDOH Screenings   Food Insecurity: No Food Insecurity (08/12/2023)  Housing: Low Risk  (08/12/2023)  Transportation Needs: No Transportation Needs (08/12/2023)  Utilities: Not At Risk (08/12/2023)  Alcohol Screen: Low Risk  (05/23/2019)  Depression (PHQ2-9): Low Risk  (08/20/2020)  Social Connections: Moderately Integrated (06/28/2023)  Tobacco Use: Low Risk  (08/12/2023)    Readmission Risk Interventions     No data to display

## 2023-08-19 DIAGNOSIS — F32A Depression, unspecified: Secondary | ICD-10-CM | POA: Diagnosis not present

## 2023-08-19 DIAGNOSIS — E43 Unspecified severe protein-calorie malnutrition: Secondary | ICD-10-CM | POA: Diagnosis not present

## 2023-08-19 DIAGNOSIS — M0609 Rheumatoid arthritis without rheumatoid factor, multiple sites: Secondary | ICD-10-CM | POA: Diagnosis not present

## 2023-08-19 DIAGNOSIS — R42 Dizziness and giddiness: Secondary | ICD-10-CM | POA: Diagnosis not present

## 2023-08-19 DIAGNOSIS — E162 Hypoglycemia, unspecified: Secondary | ICD-10-CM | POA: Diagnosis not present

## 2023-08-19 LAB — GLUCOSE, CAPILLARY
Glucose-Capillary: 85 mg/dL (ref 70–99)
Glucose-Capillary: 99 mg/dL (ref 70–99)
Glucose-Capillary: 105 mg/dL — ABNORMAL HIGH (ref 70–99)

## 2023-08-19 LAB — FECAL FAT, QUALITATIVE
Fat Qual Neutral, Stl: NORMAL
Fat Qual Total, Stl: NORMAL

## 2023-08-19 MED ORDER — FLUTICASONE PROPIONATE 50 MCG/ACT NA SUSP
1.0000 | Freq: Every day | NASAL | Status: DC
  Administered 2023-08-20 – 2023-08-24 (×5): 1 via NASAL
  Filled 2023-08-19: qty 16

## 2023-08-19 MED ORDER — SALINE SPRAY 0.65 % NA SOLN
1.0000 | NASAL | Status: DC | PRN
  Filled 2023-08-19: qty 44

## 2023-08-19 MED ORDER — LORATADINE 10 MG PO TABS
10.0000 mg | ORAL_TABLET | Freq: Every day | ORAL | Status: DC
  Administered 2023-08-19 – 2023-08-24 (×6): 10 mg via ORAL
  Filled 2023-08-19 (×6): qty 1

## 2023-08-19 NOTE — Plan of Care (Signed)
   Problem: Education: Goal: Knowledge of General Education information will improve Description Including pain rating scale, medication(s)/side effects and non-pharmacologic comfort measures Outcome: Progressing   Problem: Health Behavior/Discharge Planning: Goal: Ability to manage health-related needs will improve Outcome: Progressing

## 2023-08-19 NOTE — Progress Notes (Signed)
 Nutrition Follow-up  DOCUMENTATION CODES:   Underweight, Severe malnutrition in context of social or environmental circumstances  INTERVENTION:   -Continue Ensure Enlive po BID, each supplement provides 350 kcal and 20 grams of protein.  -Continue MVI with minerals daily -Continue 100 mg thiamine  daily -D/c Magic cup BID with meals, each supplement provides 290 kcal and 9 grams of protein  -Continue regular diet -Feeding assistance with meals (pt needs assistance with tray set up and opening containers)  NUTRITION DIAGNOSIS:   Severe Malnutrition related to social / environmental circumstances as evidenced by moderate fat depletion, severe fat depletion, moderate muscle depletion, severe muscle depletion.  Ongoing  GOAL:   Patient will meet greater than or equal to 90% of their needs  Progressing   MONITOR:   PO intake, Supplement acceptance  REASON FOR ASSESSMENT:   Consult Assessment of nutrition requirement/status  ASSESSMENT:   Pt with medical history significant of Severe protein calorie malnutrition, rheumatoid arthritis and lupus on chronic prednisone , who presents due to dizziness.  Reviewed I/O's: +240 ml x 24 hours and -533 ml since admission   Per psych notes, pt does not have capacity ot make medical decisions. Plan to obtain guardianship and potential for SNF placement.   Spoke with pt at bedside, who was pleasant and in good spirits today. Pt reports that she has a good appetite, but often eats the same thing everyday. Pt consumes rice and beans for most meals. She denies any difficulty chewing or swallowing. Noted meal completions 100%. Assisted pt with tray set up and opening packages- pt very appreciative.   Pt reports she is drinking Ensure supplements, but does not like Magic Cups, as they are "too sweet". Discussed importance of good meal and supplement intake to promote healing. RD will d/c Magic Cups due to poor acceptance. Pt amenable to continue  Ensure. She states "I'm trying to gain weight so I can get out of here". RD reviewed nutrition plan of care and how this will help her obtain her goals.   Wt has been stable since admission.   Medications reviewed and include vitamin B-12, folic acid , synthroid , protonix , prednisone , and thiamine .   Labs reviewed: CBGS: 66-125 (inpatient orders for glycemic control are none).    Diet Order:   Diet Order             Diet regular Room service appropriate? Yes; Fluid consistency: Thin  Diet effective now                   EDUCATION NEEDS:   No education needs have been identified at this time  Skin:  Skin Assessment: Skin Integrity Issues: Skin Integrity Issues:: Stage I Stage I: sacrum  Last BM:  08/17/23 (type 3)  Height:   Ht Readings from Last 1 Encounters:  08/12/23 5' (1.524 m)    Weight:   Wt Readings from Last 1 Encounters:  08/19/23 41 kg    Ideal Body Weight:  45.5 kg  BMI:  Body mass index is 17.65 kg/m.  Estimated Nutritional Needs:   Kcal:  1650-1850  Protein:  85-100 grams  Fluid:  > 1.6 L    Herschel Lords, RD, LDN, CDCES Registered Dietitian III Certified Diabetes Care and Education Specialist If unable to reach this RD, please use "RD Inpatient" group chat on secure chat between hours of 8am-4 pm daily
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
## 2023-08-20 DIAGNOSIS — M0609 Rheumatoid arthritis without rheumatoid factor, multiple sites: Secondary | ICD-10-CM | POA: Diagnosis not present

## 2023-08-20 DIAGNOSIS — R42 Dizziness and giddiness: Secondary | ICD-10-CM | POA: Diagnosis not present

## 2023-08-20 DIAGNOSIS — E162 Hypoglycemia, unspecified: Secondary | ICD-10-CM | POA: Diagnosis not present

## 2023-08-20 DIAGNOSIS — F32A Depression, unspecified: Secondary | ICD-10-CM | POA: Diagnosis not present

## 2023-08-20 DIAGNOSIS — E43 Unspecified severe protein-calorie malnutrition: Secondary | ICD-10-CM | POA: Diagnosis not present

## 2023-08-20 MED ORDER — IBUPROFEN 400 MG PO TABS
400.0000 mg | ORAL_TABLET | Freq: Four times a day (QID) | ORAL | Status: DC | PRN
  Administered 2023-08-20 – 2023-08-23 (×5): 400 mg via ORAL
  Filled 2023-08-20 (×5): qty 1

## 2023-08-20 NOTE — Progress Notes (Signed)
 Mobility Specialist - Progress Note    08/20/23 1200  Mobility  Activity Refused mobility    Pt performing hygiene care with NS at time of arrival; requests to return later this date for mobility. Will attempt another date/time.   Searcy Czech Mobility Specialist 08/20/23, 12:02 PM

## 2023-08-20 NOTE — Plan of Care (Signed)
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
## 2023-08-20 NOTE — Plan of Care (Signed)
 Alert and orient x 4.No complications this shift. Resp even and unlabored. Ambulates with walker and standby assist in room and to bathroom.  Appetite good, and Ensure given to supplement. She is free from infection with no skin breakdown noted.  Problem: Health Behavior/Discharge Planning: Goal: Ability to manage health-related needs will improve Outcome: Progressing   Problem: Clinical Measurements: Goal: Ability to maintain clinical measurements within normal limits will improve Outcome: Progressing Goal: Will remain free from infection Outcome: Progressing Goal: Diagnostic test results will improve Outcome: Progressing Goal: Respiratory complications will improve Outcome: Progressing Goal: Cardiovascular complication will be avoided Outcome: Progressing   Problem: Activity: Goal: Risk for activity intolerance will decrease Outcome: Progressing   Problem: Nutrition: Goal: Adequate nutrition will be maintained Outcome: Progressing   Problem: Coping: Goal: Level of anxiety will decrease Outcome: Progressing   Problem: Safety: Goal: Ability to remain free from injury will improve Outcome: Progressing   Problem: Skin Integrity: Goal: Risk for impaired skin integrity will decrease Outcome: Progressing   Problem: Health Behavior/Discharge Planning: Goal: Ability to manage health-related needs will improve Outcome: Progressing   Problem: Clinical Measurements: Goal: Ability to maintain clinical measurements within normal limits will improve Outcome: Progressing Goal: Will remain free from infection Outcome: Progressing Goal: Diagnostic test results will improve Outcome: Progressing Goal: Respiratory complications will improve Outcome: Progressing Goal: Cardiovascular complication will be avoided Outcome: Progressing   Problem: Activity: Goal: Risk for activity intolerance will decrease Outcome: Progressing   Problem: Nutrition: Goal: Adequate nutrition will be  maintained Outcome: Progressing   Problem: Coping: Goal: Level of anxiety will decrease Outcome: Progressing   Problem: Safety: Goal: Ability to remain free from injury will improve Outcome: Progressing   Problem: Skin Integrity: Goal: Risk for impaired skin integrity will decrease Outcome: Progressing   Problem: Health Behavior/Discharge Planning: Goal: Ability to manage health-related needs will improve Outcome: Progressing   Problem: Clinical Measurements: Goal: Ability to maintain clinical measurements within normal limits will improve Outcome: Progressing Goal: Will remain free from infection Outcome: Progressing Goal: Diagnostic test results will improve Outcome: Progressing Goal: Respiratory complications will improve Outcome: Progressing Goal: Cardiovascular complication will be avoided Outcome: Progressing   Problem: Activity: Goal: Risk for activity intolerance will decrease Outcome: Progressing   Problem: Nutrition: Goal: Adequate nutrition will be maintained Outcome: Progressing   Problem: Coping: Goal: Level of anxiety will decrease Outcome: Progressing   Problem: Safety: Goal: Ability to remain free from injury will improve Outcome: Progressing   Problem: Skin Integrity: Goal: Risk for impaired skin integrity will decrease Outcome: Progressing   Problem: Health Behavior/Discharge Planning: Goal: Ability to manage health-related needs will improve Outcome: Progressing   Problem: Clinical Measurements: Goal: Ability to maintain clinical measurements within normal limits will improve Outcome: Progressing Goal: Will remain free from infection Outcome: Progressing Goal: Diagnostic test results will improve Outcome: Progressing Goal: Respiratory complications will improve Outcome: Progressing Goal: Cardiovascular complication will be avoided Outcome: Progressing   Problem: Activity: Goal: Risk for activity intolerance will decrease Outcome:  Progressing   Problem: Nutrition: Goal: Adequate nutrition will be maintained Outcome: Progressing   Problem: Coping: Goal: Level of anxiety will decrease Outcome: Progressing   Problem: Safety: Goal: Ability to remain free from injury will improve Outcome: Progressing   Problem: Skin Integrity: Goal: Risk for impaired skin integrity will decrease Outcome: Progressing   Problem: Health Behavior/Discharge Planning: Goal: Ability to manage health-related needs will improve Outcome: Progressing   Problem: Clinical Measurements: Goal: Ability to maintain clinical measurements within normal limits  will improve Outcome: Progressing Goal: Will remain free from infection Outcome: Progressing Goal: Diagnostic test results will improve Outcome: Progressing Goal: Respiratory complications will improve Outcome: Progressing Goal: Cardiovascular complication will be avoided Outcome: Progressing   Problem: Activity: Goal: Risk for activity intolerance will decrease Outcome: Progressing   Problem: Nutrition: Goal: Adequate nutrition will be maintained Outcome: Progressing   Problem: Coping: Goal: Level of anxiety will decrease Outcome: Progressing   Problem: Safety: Goal: Ability to remain free from injury will improve Outcome: Progressing   Problem: Skin Integrity: Goal: Risk for impaired skin integrity will decrease Outcome: Progressing   Problem: Health Behavior/Discharge Planning: Goal: Ability to manage health-related needs will improve Outcome: Progressing   Problem: Clinical Measurements: Goal: Ability to maintain clinical measurements within normal limits will improve Outcome: Progressing Goal: Will remain free from infection Outcome: Progressing Goal: Diagnostic test results will improve Outcome: Progressing Goal: Respiratory complications will improve Outcome: Progressing Goal: Cardiovascular complication will be avoided Outcome: Progressing   Problem:  Activity: Goal: Risk for activity intolerance will decrease Outcome: Progressing   Problem: Nutrition: Goal: Adequate nutrition will be maintained Outcome: Progressing   Problem: Coping: Goal: Level of anxiety will decrease Outcome: Progressing   Problem: Safety: Goal: Ability to remain free from injury will improve Outcome: Progressing   Problem: Skin Integrity: Goal: Risk for impaired skin integrity will decrease Outcome: Progressing   Problem: Health Behavior/Discharge Planning: Goal: Ability to manage health-related needs will improve Outcome: Progressing   Problem: Clinical Measurements: Goal: Ability to maintain clinical measurements within normal limits will improve Outcome: Progressing Goal: Will remain free from infection Outcome: Progressing Goal: Diagnostic test results will improve Outcome: Progressing Goal: Respiratory complications will improve Outcome: Progressing Goal: Cardiovascular complication will be avoided Outcome: Progressing   Problem: Activity: Goal: Risk for activity intolerance will decrease Outcome: Progressing   Problem: Nutrition: Goal: Adequate nutrition will be maintained Outcome: Progressing   Problem: Coping: Goal: Level of anxiety will decrease Outcome: Progressing   Problem: Safety: Goal: Ability to remain free from injury will improve Outcome: Progressing   Problem: Skin Integrity: Goal: Risk for impaired skin integrity will decrease Outcome: Progressing   Problem: Health Behavior/Discharge Planning: Goal: Ability to manage health-related needs will improve Outcome: Progressing   Problem: Clinical Measurements: Goal: Ability to maintain clinical measurements within normal limits will improve Outcome: Progressing Goal: Will remain free from infection Outcome: Progressing Goal: Diagnostic test results will improve Outcome: Progressing Goal: Respiratory complications will improve Outcome: Progressing Goal:  Cardiovascular complication will be avoided Outcome: Progressing   Problem: Activity: Goal: Risk for activity intolerance will decrease Outcome: Progressing   Problem: Nutrition: Goal: Adequate nutrition will be maintained Outcome: Progressing   Problem: Coping: Goal: Level of anxiety will decrease Outcome: Progressing   Problem: Safety: Goal: Ability to remain free from injury will improve Outcome: Progressing   Problem: Skin Integrity: Goal: Risk for impaired skin integrity will decrease Outcome: Progressing   Problem: Health Behavior/Discharge Planning: Goal: Ability to manage health-related needs will improve Outcome: Progressing   Problem: Clinical Measurements: Goal: Ability to maintain clinical measurements within normal limits will improve Outcome: Progressing Goal: Will remain free from infection Outcome: Progressing Goal: Diagnostic test results will improve Outcome: Progressing Goal: Respiratory complications will improve Outcome: Progressing Goal: Cardiovascular complication will be avoided Outcome: Progressing   Problem: Activity: Goal: Risk for activity intolerance will decrease Outcome: Progressing   Problem: Nutrition: Goal: Adequate nutrition will be maintained Outcome: Progressing   Problem: Coping: Goal: Level of anxiety will decrease  Outcome: Progressing   Problem: Safety: Goal: Ability to remain free from injury will improve Outcome: Progressing   Problem: Skin Integrity: Goal: Risk for impaired skin integrity will decrease Outcome: Progressing
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
## 2023-08-21 DIAGNOSIS — E43 Unspecified severe protein-calorie malnutrition: Secondary | ICD-10-CM | POA: Diagnosis not present

## 2023-08-21 DIAGNOSIS — F32A Depression, unspecified: Secondary | ICD-10-CM | POA: Diagnosis not present

## 2023-08-21 DIAGNOSIS — M0609 Rheumatoid arthritis without rheumatoid factor, multiple sites: Secondary | ICD-10-CM | POA: Diagnosis not present

## 2023-08-21 DIAGNOSIS — E162 Hypoglycemia, unspecified: Secondary | ICD-10-CM | POA: Diagnosis not present

## 2023-08-21 DIAGNOSIS — R42 Dizziness and giddiness: Secondary | ICD-10-CM | POA: Diagnosis not present

## 2023-08-21 LAB — COMPREHENSIVE METABOLIC PANEL WITH GFR
ALT: 32 U/L (ref 0–44)
AST: 57 U/L — ABNORMAL HIGH (ref 15–41)
Albumin: 2.7 g/dL — ABNORMAL LOW (ref 3.5–5.0)
Alkaline Phosphatase: 44 U/L (ref 38–126)
Anion gap: 6 (ref 5–15)
BUN: 31 mg/dL — ABNORMAL HIGH (ref 6–20)
CO2: 26 mmol/L (ref 22–32)
Calcium: 8.9 mg/dL (ref 8.9–10.3)
Chloride: 101 mmol/L (ref 98–111)
Creatinine, Ser: 0.44 mg/dL (ref 0.44–1.00)
GFR, Estimated: >60 mL/min (ref >60)
Glucose, Bld: 89 mg/dL (ref 70–99)
Potassium: 4.5 mmol/L (ref 3.5–5.1)
Sodium: 133 mmol/L — ABNORMAL LOW (ref 135–145)
Total Bilirubin: 0.4 mg/dL (ref 0.0–1.2)
Total Protein: 8.3 g/dL — ABNORMAL HIGH (ref 6.5–8.1)

## 2023-08-21 LAB — CBC
HCT: 30.7 % — ABNORMAL LOW (ref 36.0–46.0)
Hemoglobin: 9.3 g/dL — ABNORMAL LOW (ref 12.0–15.0)
MCH: 28.6 pg (ref 26.0–34.0)
MCHC: 30.3 g/dL (ref 30.0–36.0)
MCV: 94.5 fL (ref 80.0–100.0)
Platelets: 456 10*3/uL — ABNORMAL HIGH (ref 150–400)
RBC: 3.25 MIL/uL — ABNORMAL LOW (ref 3.87–5.11)
RDW: 14.9 % (ref 11.5–15.5)
WBC: 6.1 10*3/uL (ref 4.0–10.5)
nRBC: 0 % (ref 0.0–0.2)

## 2023-08-21 LAB — GLUCOSE, CAPILLARY: Glucose-Capillary: 82 mg/dL (ref 70–99)

## 2023-08-21 MED ORDER — MECLIZINE HCL 25 MG PO TABS
25.0000 mg | ORAL_TABLET | Freq: Three times a day (TID) | ORAL | Status: DC | PRN
  Administered 2023-08-21: 25 mg via ORAL
  Filled 2023-08-21 (×2): qty 1
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
## 2023-08-21 NOTE — Progress Notes (Signed)
 Nutrition Follow-up  DOCUMENTATION CODES:   Underweight, Severe malnutrition in context of social or environmental circumstances  INTERVENTION:   -Continue Ensure Enlive po BID, each supplement provides 350 kcal and 20 grams of protein -Continue MVI with minerals daily -Continue 100 mg thiamine  daily -Continue regular diet -Continue feeding assistance with meals (pt needs assistance with tray set up and opening containers)  NUTRITION DIAGNOSIS:   Severe Malnutrition related to social / environmental circumstances as evidenced by moderate fat depletion, severe fat depletion, moderate muscle depletion, severe muscle depletion.  Ongoing  GOAL:   Patient will meet greater than or equal to 90% of their needs  Progressing   MONITOR:   PO intake, Supplement acceptance  REASON FOR ASSESSMENT:   Consult Assessment of nutrition requirement/status  ASSESSMENT:   Pt with medical history significant of Severe protein calorie malnutrition, rheumatoid arthritis and lupus on chronic prednisone , who presents due to dizziness.  Reviewed I/O's: -193 ml since admission   Per psych notes, pt does not have capacity ot make medical decisions. Plan to obtain guardianship and potential for SNF placement. Per TOC, awaiting APS reply for guardianship documentation needs.   Pt remains on a regular diet with good oral intake. Noted meal completions 90-100%. Pt also consuming Ensure supplements.   Wt has been stable since admission.   Medications reviewed and include vitamin B-12, lovenox , folic acid , prednisone , and thiamine .   Labs reviewed: Na: 133, CBGS: 66-124 (inpatient orders for glycemic control are none).    Diet Order:   Diet Order             Diet regular Room service appropriate? Yes; Fluid consistency: Thin  Diet effective now                   EDUCATION NEEDS:   No education needs have been identified at this time  Skin:  Skin Assessment: Skin Integrity  Issues: Skin Integrity Issues:: Stage I Stage I: sacrum  Last BM:  08/20/23 (type 3)  Height:   Ht Readings from Last 1 Encounters:  08/12/23 5' (1.524 m)    Weight:   Wt Readings from Last 1 Encounters:  08/21/23 41.6 kg    Ideal Body Weight:  45.5 kg  BMI:  Body mass index is 17.91 kg/m.  Estimated Nutritional Needs:   Kcal:  1650-1850  Protein:  85-100 grams  Fluid:  > 1.6 L    Herschel Lords, RD, LDN, CDCES Registered Dietitian III Certified Diabetes Care and Education Specialist If unable to reach this RD, please use "RD Inpatient" group chat on secure chat between hours of 8am-4 pm daily
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
## 2023-08-21 NOTE — TOC Progression Note (Signed)
 Transition of Care [facility]) - Progression Note    Patient Details  Name: Sheila Lang MRN: [id number] Date of Birth: 1965/12/25  Transition of Care [facility]) CM/SW Contact  Elsie Halo, RN Phone Number: 08/21/2023, 4:26 PM  Clinical Narrative:     Awaiting DSS guardianship; hearing has not been scheduled.  Expected Discharge Plan: Home w Home Health Services Barriers to Discharge: Continued Medical Work up  Expected Discharge Plan and Services       Living arrangements for the past 2 months: Single Family Home                                       Social Determinants of Health (SDOH) Interventions SDOH Screenings   Food Insecurity: No Food Insecurity (08/12/2023)  Housing: Low Risk  (08/12/2023)  Transportation Needs: No Transportation Needs (08/12/2023)  Utilities: Not At Risk (08/12/2023)  Alcohol Screen: Low Risk  (05/23/2019)  Depression (PHQ2-9): Low Risk  (08/20/2020)  Social Connections: Moderately Integrated (06/28/2023)  Tobacco Use: Low Risk  (08/12/2023)    Readmission Risk Interventions     No data to display

## 2023-08-22 DIAGNOSIS — E162 Hypoglycemia, unspecified: Secondary | ICD-10-CM | POA: Diagnosis not present

## 2023-08-22 DIAGNOSIS — E43 Unspecified severe protein-calorie malnutrition: Secondary | ICD-10-CM | POA: Diagnosis not present

## 2023-08-22 DIAGNOSIS — M0609 Rheumatoid arthritis without rheumatoid factor, multiple sites: Secondary | ICD-10-CM | POA: Diagnosis not present

## 2023-08-22 DIAGNOSIS — R42 Dizziness and giddiness: Secondary | ICD-10-CM | POA: Diagnosis not present

## 2023-08-22 DIAGNOSIS — F32A Depression, unspecified: Secondary | ICD-10-CM | POA: Diagnosis not present

## 2023-08-22 NOTE — TOC Progression Note (Addendum)
 Transition of Care [facility]) - Progression Note    Patient Details  Name: Sheila Lang MRN: [id number] Date of Birth: 11-01-1965  Transition of Care [facility]) CM/SW Contact  Areta Beer, RN Phone Number: 08/22/2023, 2:02 PM  Clinical Narrative: 5/3: Call into weekend on call APS DSS for a call back to RN CM regarding provider and psychiatry consult stating patient has capacity. A DSS report was made by prior CM and a hearing is pending. Patient wants to discharge per provider. Pending call back to this RN CM.  Update: DSS SW Isa Manuel returned call and will check with her supervisor regarding next steps given the new information passed on from above. 310 pm.    Katheryn Pandy MSN RN CM  RN Case Manager [place]  Transitions of Care Direct Dial: [phone] (Weekends Only) [facility] Main Office Phone: [phone] [facility] Fax: [phone] [email or web address]       Expected Discharge Plan: Home w Home Health Services Barriers to Discharge: Continued Medical Work up  Expected Discharge Plan and Services       Living arrangements for the past 2 months: Single Family Home                                       Social Determinants of Health (SDOH) Interventions SDOH Screenings   Food Insecurity: No Food Insecurity (08/12/2023)  Housing: Low Risk  (08/12/2023)  Transportation Needs: No Transportation Needs (08/12/2023)  Utilities: Not At Risk (08/12/2023)  Alcohol Screen: Low Risk  (05/23/2019)  Depression (PHQ2-9): Low Risk  (08/20/2020)  Social Connections: Moderately Integrated (06/28/2023)  Tobacco Use: Low Risk  (08/12/2023)    Readmission Risk Interventions     No data to display

## 2023-08-22 NOTE — Consult Note (Signed)
 [place] [facility] Consult Follow Up  Patient Name: .Sheila Lang  MRN: [id number]  DOB: 09/20/1965  Consult Order details:  Orders (From admission, onward)     Start     Ordered   08/14/23 1456  IP CONSULT TO PSYCHIATRY       Ordering Provider: Melvinia Stager, MD  Provider:  (Not yet assigned)  Question Answer Comment  Location [facility]   Reason for Consult? capacity/competency evaluation      08/14/23 1456             Mode of Visit: In person    Psychiatry Consult Evaluation  Service Date: Aug 22, 2023 LOS:  LOS: 10 days  Chief Complaint "Just life, dealing with people who are imbecile"  Primary Psychiatric Diagnoses  Depression 2.  Anxiety   Assessment  Sheila Lang is a 58 y.o. female admitted: Presented to the [facility] 08/11/2023  2:13 PM  complaining of dizziness. She carries the psychiatric diagnoses of depression and anxiety and has a past medical history of  Hypoglycemia, Lupus, Vit D deficiency, etc. .  Patient was asked to be seen for follow-up for decisional capacity reportedly she was evaluated previously and was found to not have decisional capacity, patient was seen today face-to-face she was lying on the bed she was somewhat asleep but was able to wake up and engage she reports that she was having significant vertigo because of which she came in she reports that she feels satisfied with the treatment and she is feeling better she lives with her daughter there seems to be a lot of concerns about her financial status and psychosocial status reportedly her daughter is not able to provide for her much and her needs are significant with like significant medical issues and she almost looks like failure to thrive at this time.  Patient is able to verbalize that she is feeling better and she feels safe at home.   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 factor (HCC)   Hypoglycemia    Plan   ## Psychiatric Medication Recommendations:  None at this time  ## Medical Decision Making Capacity: Patient does have decisional capacity my concern is not with decisional capacity my concern is more about psychosocial factors which are leading her to not able to care for herself she appears cachectic and she may benefit from more social work support in terms of possible placement in a short-term nursing home/in-home services to help her/there are concerns with adequate nutrition and her being able to navigate outpatient resources for both her physical health and her mental health anything to enhance these will be helpful continued staying with daughter who reportedly/allegedly has not been able to provide support may lead to frequent admissions due to lack of support I am concerned about her deterioration.  Recommend case management to discuss this case to see how we can optimize her discharge with support ## Further Work-up:  APS (neglect is suspected)  -- Pertinent labwork reviewed earlier this admission includes: All labs reviewed   ## Disposition:-- There are no psychiatric contraindications to discharge at this time  ## Behavioral / Environmental: - No specific recommendations at this time.     ## Safety and Observation Level:  - Based on my clinical evaluation, I estimate the patient to  be at Low risk of self harm in the current setting. - At this time, we recommend  routine. This decision is based on my review of the chart including patient's history and current presentation, interview of the patient, mental status examination, and consideration of suicide risk including evaluating suicidal ideation, plan, intent, suicidal or self-harm behaviors, risk factors, and protective factors. This judgment is  based on our ability to directly address suicide risk, implement suicide prevention strategies, and develop a safety plan while the patient is in the clinical setting. Please contact our team if there is a concern that risk level has changed.  CSSR Risk Category:C-SSRS RISK CATEGORY: No Risk  Suicide Risk Assessment: Patient has following modifiable risk factors for suicide: lack of access to outpatient mental health resources, which we are addressing by recommending mental health services in outpatient settings. Patient has following non-modifiable or demographic risk factors for suicide: NA Patient has the following protective factors against suicide: no history of suicide attempts and no history of NSSIB  Thank you for this consult request. Recommendations have been communicated to the primary team.  We will sign off at this time. Aaron Aas   Lindajo Resides, MD       History of Present Illness  Sheila Lang is a 58 y.o. female admitted: Presented to the [facility] 08/11/2023  2:13 PM  complaining of dizziness. She carries the psychiatric diagnoses of depression and anxiety and has a past medical history of  Hypoglycemia, Lupus, Vit D deficiency, etc. .    Review of Systems  Neurological:  Positive for dizziness and weakness.  Psychiatric/Behavioral:  Positive for depression. The patient is nervous/anxious.      Psychiatric and Social History  Psychiatric History:  Information collected from Patient, chart and patient's cousin  Prev Dx/Sx: Depression, anxiety Current Psych Provider: None reported Home Meds (current): Denies Previous Med Trials: Denies Therapy: Denies  Prior Psych Hospitalization: Denies Prior Self Harm: Denies Prior Violence: Denies  Family Psych History: Paternal grand dad died by suicide-shot himself Family Hx suicide: About  Social History:  Developmental Hx: NA Educational Hx: Went to college Occupational Hx: Currently unemployed, worked as Agricultural engineer  for many years Legal Hx: NA Living Situation: Lives with her daughter who is not supportive Spiritual Hx: NA Access to weapons/lethal means: Denies  Substance History Alcohol: Denies  Type of alcohol Na Last Drink NA Number of drinks per day NA History of alcohol withdrawal seizures NA History of DT's NA Tobacco: NA Illicit drugs: NA Prescription drug abuse: NA Rehab hx: NA  Exam Findings  Physical Exam: Reviewed and agree with the physical exam findings conducted by the medical provider Vital Signs:  Temp:  [98.1 F (36.7 C)-99 F (37.2 C)] 98.1 F (36.7 C) (05/03 0720) Pulse Rate:  [100-115] 105 (05/03 0908) Resp:  [15-18] 18 (05/03 0720) BP: (92-107)/(46-74) 92/74 (05/03 0720) SpO2:  [95 %-100 %] 100 % (05/03 0510) Blood pressure 92/74, pulse (!) 105, temperature 98.1 F (36.7 C), resp. rate 18, height 5' (1.524 m), weight 41.6 kg, last menstrual period 07/16/2017, SpO2 100%. Body mass index is 17.91 kg/m.    Mental status examination patient is a 58 year old female who appears older than her age very thin built lying on the bed alert oriented x 3 mood is okay affect is constricted thought process logical and coherent denies any suicidal thoughts no perceptual disturbances extremely thin built Insight and judgment fair.    Other History   These have been pulled in  through the EMR, reviewed, and updated if appropriate.  Family History:  The patient's family history includes Arthritis in her father.  Medical History: Past Medical History:  Diagnosis Date   Anemia    Arthritis    Depression    Lupus (systemic lupus erythematosus) (HCC)     Surgical History: Past Surgical History:  Procedure Laterality Date   OVARIAN CYST REMOVAL       Medications:   Current Facility-Administered Medications:    acetaminophen  (TYLENOL ) tablet 650 mg, 650 mg, Oral, Q6H PRN, 650 mg at 08/18/23 1521 **OR** acetaminophen  (TYLENOL ) suppository 650 mg, 650 mg, Rectal, Q6H PRN,  Avi Body, MD   allopurinol  (ZYLOPRIM ) tablet 100 mg, 100 mg, Oral, Daily, Avi Body, MD, 100 mg at 08/22/23 0931   cyanocobalamin  (VITAMIN B12) tablet 1,000 mcg, 1,000 mcg, Oral, Daily, Avi Body, MD, 1,000 mcg at 08/22/23 0934   enoxaparin  (LOVENOX ) injection 30 mg, 30 mg, Subcutaneous, Q24H, Basaraba, Iulia, MD, 30 mg at 08/18/23 2313   feeding supplement (ENSURE ENLIVE / ENSURE PLUS) liquid 237 mL, 237 mL, Oral, BID BM, Wouk, Haynes Lips, MD, 237 mL at 08/22/23 0943   fluticasone  (FLONASE ) 50 MCG/ACT nasal spray 1 spray, 1 spray, Each Nare, Daily, Dezii, Alexandra, DO, 1 spray at 08/22/23 1610   folic acid  (FOLVITE ) tablet 1 mg, 1 mg, Oral, Daily, Avi Body, MD, 1 mg at 08/22/23 0935   ibuprofen  (ADVIL ) tablet 400 mg, 400 mg, Oral, Q6H PRN, Mansy, Jan A, MD, 400 mg at 08/21/23 1709   levothyroxine  (SYNTHROID ) tablet 50 mcg, 50 mcg, Oral, Q0600, Wouk, Noah Bedford, MD, 50 mcg at 08/22/23 0534   loratadine  (CLARITIN ) tablet 10 mg, 10 mg, Oral, Daily, Dezii, Alexandra, DO, 10 mg at 08/22/23 9604   meclizine  (ANTIVERT ) tablet 25 mg, 25 mg, Oral, TID PRN, Dezii, Alexandra, DO, 25 mg at 08/21/23 1029   multivitamin with minerals tablet 1 tablet, 1 tablet, Oral, Daily, Avi Body, MD, 1 tablet at 08/22/23 5409   ondansetron  (ZOFRAN ) tablet 4 mg, 4 mg, Oral, Q6H PRN, 4 mg at 08/17/23 1749 **OR** ondansetron  (ZOFRAN ) injection 4 mg, 4 mg, Intravenous, Q6H PRN, Avi Body, MD   pantoprazole  (PROTONIX ) EC tablet 40 mg, 40 mg, Oral, Daily, Avi Body, MD, 40 mg at 08/22/23 0935   predniSONE  (DELTASONE ) tablet 5 mg, 5 mg, Oral, Daily, Avi Body, MD, 5 mg at 08/22/23 0932   senna-docusate (Senokot-S) tablet 1 tablet, 1 tablet, Oral, QHS PRN, Avi Body, MD   sodium chloride  (OCEAN) 0.65 % nasal spray 1 spray, 1 spray, Each Nare, PRN, Dezii, Alexandra, DO   sodium chloride  flush (NS) 0.9 % injection 3 mL, 3 mL, Intravenous, Q12H, Avi Body, MD, 3 mL at  08/22/23 0944   thiamine  (VITAMIN B1) tablet 100 mg, 100 mg, Oral, Daily, Basaraba, Iulia, MD, 100 mg at 08/22/23 8119  Allergies: No Known Allergies  Lindajo Resides, MD

## 2023-08-22 NOTE — Progress Notes (Signed)
 PROGRESS NOTE    Sheila Lang  ZOX:[id number] DOB: 1966/02/01 DOA: 08/11/2023 PCP: Rory Collard, MD  Chief Complaint  Patient presents with   Dizziness    Hospital Course:  Sheila Lang is a 58 year old female with severe protein calorie malnutrition, rheumatoid arthritis, lupus on chronic prednisone , who presents to the ED with dizziness.  She reports she also developed nausea and vomiting secondary to the dizziness.  Denies any chest pain, dyspnea, abdominal pain.  Patient was admitted for workup.  MRI negative.  Symptoms thought to be secondary to BPPV.  Stay then prolonged due to severe malnutrition.  Appears patient does not have a safe living situation.  Initially it was determined that patient did not have capacity to make medical decisions.  TOC was consulted for APS report and guardianship case was opened.  Gradually throughout this hospitalization patient has improved significantly and upon reassessment now has capacity to make medical decisions.  Subjective: Patient endorses generalized pain today, she is less anxious to discharge home.  Objective: Vitals:   08/21/23 1931 08/22/23 0510 08/22/23 0720 08/22/23 0908  BP: 102/62 (!) 99/46 92/74   Pulse: (!) 101 100 (!) 115 (!) 105  Resp: 15 18 18    Temp: 98.8 F (37.1 C) 99 F (37.2 C) 98.1 F (36.7 C)   TempSrc: Oral     SpO2: 100% 100%    Weight:      Height:        Intake/Output Summary (Last 24 hours) at 08/22/2023 1558 Last data filed at 08/21/2023 1900 Gross per 24 hour  Intake 120 ml  Output --  Net 120 ml    Filed Weights   08/20/23 0500 08/20/23 0525 08/21/23 0428  Weight: 40.1 kg 38.2 kg 41.6 kg    Examination: General exam: Appears calm and comfortable, NAD, thin Respiratory system: No work of breathing, symmetric chest wall expansion Cardiovascular system: S1 & S2 heard, RRR.  Gastrointestinal system: Abdomen is nondistended, soft and nontender.  Neuro: Alert and oriented x4. No focal  neurological deficits. Extremities: Symmetric, expected ROM Skin: No rashes, lesions Psychiatry: Demonstrates appropriate judgement and insight. Mood & affect appropriate for situation.   Assessment & Plan:  Principal Problem:   Dizziness Active Problems:   Protein-calorie malnutrition, severe (HCC) / Cachexia   Hypoglycemia   Depression   Rheumatoid arthritis of multiple sites with negative rheumatoid factor (HCC)    Unsafe living situation Incapacity - Patient found to be living in a unkept home without access to food.  Inability to care for self - Psychiatry was consulted early on her admission and determined that patient lacks capacity to make medical decisions. - On my evaluation the patient appears to be alert and oriented x 4, demonstrates her level of thinking, and has future planning.  Have asked psychiatry to reevaluate the patient today and they agree that she now appears to demonstrate decision-making capacity - We do have ongoing concerns regarding her safety at home, and inconsistent ability to meet her health needs. - Have discussed with TOC who has reached back out to APS to receive update on her case.  Appears at this time patient will no longer require a guardian but at minimum would benefit from significant social services  BPPV - Improved significantly now - Additional vertigo episodes with as needed meclizine  - Continue vestibular PT - MRI negative - Continue with nasal saline spray, Flonase , cetirizine for eustachian tube dysfunction which is playing a role.  She demonstrates significant relief since initiation of  these meds  Severe malnutrition Weight loss BMI 17 - Limited access to food at home.  Reportedly home is unkept - Patient reports that she has been unemployed for 2 months and her daughter's income is only enough to cover the bills but not for food.  She is working on establishing regular food delivery with her [employer]. -TOC working on APS/DSS  case. -No significant electrolyte abnormalities - Renal function within normal limits, benign UA - HIV negative - Reported normal bowel movements - Vitamin D  within normal limits - Fecal fat within normal limits, no evidence of malabsorption - RD consulted, patient appears to be eating well here.  Malnutrition likely secondary to food insecurity - No evidence of refeeding syndrome - Continue trending daily CMP, mag, Phos.  Will replace as needed.  Bronchiectasis - Seen on chest CT - Pulmonology f/u outpatient. No acute issues  Hypothyroidism - TSH elevated 16, T4 normal - Has been initiated on levothyroxine  50 mcg - Will need follow-up TSH in 4 to 6 weeks  Rheumatoid arthritis Lupus - Patient's outpatient rheumatologist, Dr. Lydia Sams, has discussed with prior attending who reports that current condition is controlled and advises continuing daily prednisone  - Continue with home dose 5 mg daily, she will follow-up outpatient to discuss alternative agents.   DVT prophylaxis: Lovenox    Code Status: Full Code Disposition: Inpatient pending safe placement.  Medically stable to discharge.  Patient appears to have regained capacity to make medical decisions.  She likely will not require a guardian, but does still require significant social support outpatient.  Consultants:  Behavioral medicine team  Procedures:    Antimicrobials:  Anti-infectives (From admission, onward)    Start     Dose/Rate Route Frequency Ordered Stop   08/12/23 1600  cefTRIAXone  (ROCEPHIN ) 1 g in sodium chloride  0.9 % 100 mL IVPB  Status:  Discontinued        1 g 200 mL/hr over 30 Minutes Intravenous One time only - 1600 08/11/23 1644 08/11/23 1756       Data Reviewed: I have personally reviewed following labs and imaging studies CBC: Recent Labs  Lab 08/21/23 0413  WBC 6.1  HGB 9.3*  HCT 30.7*  MCV 94.5  PLT 456*   Basic Metabolic Panel: Recent Labs  Lab 08/18/23 1621 08/21/23 0413  NA 136  133*  K 4.5 4.5  CL 102 101  CO2 24 26  GLUCOSE 116* 89  BUN 29* 31*  CREATININE 0.58 0.44  CALCIUM  9.0 8.9  MG 2.0  --   PHOS 4.4  --    GFR: Estimated Creatinine Clearance: 50.3 mL/min (by C-G formula based on SCr of 0.44 mg/dL). Liver Function Tests: Recent Labs  Lab 08/18/23 1621 08/21/23 0413  AST 63* 57*  ALT 39 32  ALKPHOS 46 44  BILITOT 0.4 0.4  PROT 8.1 8.3*  ALBUMIN 2.7* 2.7*   CBG: Recent Labs  Lab 08/18/23 2107 08/19/23 0725 08/19/23 1136 08/19/23 1625 08/21/23 0723  GLUCAP 124* 85 99 105* 82    No results found for this or any previous visit (from the past 240 hours).   Radiology Studies: No results found.  Scheduled Meds:  allopurinol   100 mg Oral Daily   vitamin B-12  1,000 mcg Oral Daily   enoxaparin  (LOVENOX ) injection  30 mg Subcutaneous Q24H   feeding supplement  237 mL Oral BID BM   fluticasone   1 spray Each Nare Daily   folic acid   1 mg Oral Daily   levothyroxine   50 mcg Oral Q0600   loratadine   10 mg Oral Daily   multivitamin with minerals  1 tablet Oral Daily   pantoprazole   40 mg Oral Daily   predniSONE   5 mg Oral Daily   sodium chloride  flush  3 mL Intravenous Q12H   thiamine   100 mg Oral Daily   Continuous Infusions:   LOS: 10 days  MDM: Patient is high risk for one or more organ failure.  They necessitate ongoing hospitalization for continued IV therapies and subsequent lab monitoring. Total time spent interpreting labs and vitals, reviewing the medical record, coordinating care amongst consultants and care team members, directly assessing and discussing care with the patient and/or family: 55 min  Jabria Loos, DO [facility]  To contact the attending physician between 7A-7P please use Epic Chat. To contact the covering physician during after hours 7P-7A, please review Amion.  08/22/2023, 3:58 PM   *This document has been created with the assistance of dictation software. Please excuse typographical errors. *

## 2023-08-23 DIAGNOSIS — E43 Unspecified severe protein-calorie malnutrition: Secondary | ICD-10-CM | POA: Diagnosis not present

## 2023-08-23 DIAGNOSIS — M0609 Rheumatoid arthritis without rheumatoid factor, multiple sites: Secondary | ICD-10-CM | POA: Diagnosis not present

## 2023-08-23 DIAGNOSIS — R42 Dizziness and giddiness: Secondary | ICD-10-CM | POA: Diagnosis not present

## 2023-08-23 DIAGNOSIS — F32A Depression, unspecified: Secondary | ICD-10-CM | POA: Diagnosis not present

## 2023-08-23 DIAGNOSIS — E162 Hypoglycemia, unspecified: Secondary | ICD-10-CM | POA: Diagnosis not present

## 2023-08-23 LAB — TROPONIN I (HIGH SENSITIVITY)
Troponin I (High Sensitivity): 22 ng/L — ABNORMAL HIGH (ref <18)
Troponin I (High Sensitivity): 21 ng/L — ABNORMAL HIGH (ref <18)

## 2023-08-23 NOTE — Plan of Care (Signed)
  Problem: Health Behavior/Discharge Planning: Goal: Ability to manage health-related needs will improve Outcome: Progressing   Problem: Clinical Measurements: Goal: Cardiovascular complication will be avoided Outcome: Progressing   Problem: Elimination: Goal: Will not experience complications related to bowel motility Outcome: Progressing   Problem: Pain Managment: Goal: General experience of comfort will improve and/or be controlled Outcome: Progressing

## 2023-08-23 NOTE — Progress Notes (Signed)
 Mobility Specialist - Progress Note    08/23/23 1200  Mobility  Activity Ambulated with assistance in hallway;Stood at bedside  Level of Assistance Standby assist, set-up cues, supervision of patient - no hands on  Assistive Device Front wheel walker  Distance Ambulated (ft) 160 ft  Range of Motion/Exercises Active  Activity Response Tolerated well  Mobility Referral Yes  Mobility visit 1 Mobility   Pt resting in bed on RA upon entry. Pt STS and ambulates to hallway around NS SBA with RW. O2:100, Pulse:130 prior to ambulation RN notified. Pt returned to EOB and endorsed feel new feeling in chest. RN notified and took over. Pt left EOB with needs in reach and RN present at bedside.   Jerri Morale Mobility Specialist 08/23/23, 2:59 PM

## 2023-08-23 NOTE — Progress Notes (Signed)
 PROGRESS NOTE    Sheila Lang  ZOX:[id number] DOB: 12/15/1965 DOA: 08/11/2023 PCP: Rory Collard, MD  Chief Complaint  Patient presents with   Dizziness    Hospital Course:  Sheila Lang is a 58 year old female with severe protein calorie malnutrition, rheumatoid arthritis, lupus on chronic prednisone , who presents to the ED with dizziness.  She reports she also developed nausea and vomiting secondary to the dizziness.  Denies any chest pain, dyspnea, abdominal pain.  Patient was admitted for workup.  MRI negative.  Symptoms thought to be secondary to BPPV.  Stay then prolonged due to severe malnutrition.  Appears patient does not have a safe living situation.  Initially it was determined that patient did not have capacity to make medical decisions.  TOC was consulted for APS report and guardianship case was opened.  Gradually throughout this hospitalization patient has improved significantly and upon reassessment now has capacity to make medical decisions.  Subjective: Planning for discharge today.  Patient began ambulating in the hallway earlier and experiencing chest pain.  Heart rate notably elevated to the 130s.  EKG was performed which revealed normal sinus.  Troponin 22 with repeat 21.  Patient's chest pain resolved spontaneously.  Repeat EKG was ordered and is still pending at this time Patient reports otherwise she feels well.  No dizziness today  Objective: Vitals:   08/23/23 0840 08/23/23 1222 08/23/23 1557 08/23/23 1610  BP: (!) 99/56 91/60 91/61    Pulse: 89  (!) 121 (!) 106  Resp: 18 20 18    Temp: 98.1 F (36.7 C)  98.9 F (37.2 C)   TempSrc:   Oral   SpO2: (!) 72% 98% 100%   Weight:      Height:        Intake/Output Summary (Last 24 hours) at 08/23/2023 1801 Last data filed at 08/23/2023 1112 Gross per 24 hour  Intake 3 ml  Output --  Net 3 ml    Filed Weights   08/20/23 0525 08/21/23 0428 08/23/23 0540  Weight: 38.2 kg 41.6 kg 39.2 kg     Examination: General exam: Appears calm and comfortable, NAD, thin Respiratory system: No work of breathing, symmetric chest wall expansion Cardiovascular system: S1 & S2 heard, RRR.  Gastrointestinal system: Abdomen is nondistended, soft and nontender.  Neuro: Alert and oriented x4. No focal neurological deficits. Extremities: Symmetric, expected ROM Skin: No rashes, lesions Psychiatry: Demonstrates appropriate judgement and insight. Mood & affect appropriate for situation.   Assessment & Plan:  Principal Problem:   Dizziness Active Problems:   Protein-calorie malnutrition, severe (HCC) / Cachexia   Hypoglycemia   Depression   Rheumatoid arthritis of multiple sites with negative rheumatoid factor (HCC)    Unsafe living situation Incapacity - Patient found to be living in a unkept home without access to food.  Inability to care for self - Psychiatry was consulted early on her admission and determined that patient lacks capacity to make medical decisions. - On reevaluations patient appears to be alert and oriented x 4, demonstrates higher level of thinking and future planning.  Psychiatry has also reevaluated the patient.  We both agree that she now appears to demonstrate decision-making capacity - Ongoing concerns regarding safety at home and inconsistent ability to meet her health needs.  I discussed this extensively with the patient.  She endorses understanding and reports that she has arranged for meal delivery with a local church. - Have discussed with TOC who reach back out to APS to receive update on her case.  At this time patient will no longer require a guardian but at minimum would benefit from significant social services.  Still awaiting update at this time  Chest pain Tachycardia - Intermittent tachycardia today with associated chest pain.  Sinus tach on EKG - Troponin 22 with repeat 21.  No obvious STEMI on EKG.  Repeat EKG has been ordered now that event has passed  will review  BPPV - Improved significantly now - Additional vertigo episodes with as needed meclizine  - Continue vestibular PT - MRI negative - Continue with nasal saline spray, Flonase , cetirizine for eustachian tube dysfunction which is playing a role.  She demonstrates significant relief since initiation of these meds  Severe malnutrition Weight loss BMI 17 - Limited access to food at home.  Reportedly home is unkept - Patient reports that she has been unemployed for 2 months and her daughter's income is only enough to cover the bills but not for food.  She is working on establishing regular food delivery with her [employer]. -TOC working on APS/DSS case.  See note above regarding capacity. -No significant electrolyte abnormalities - Renal function within normal limits, benign UA - HIV negative - Reported normal bowel movements - Vitamin D  within normal limits - Fecal fat within normal limits, no evidence of malabsorption - RD consulted, patient appears to be eating well here.  Malnutrition likely secondary to food insecurity - No evidence of refeeding syndrome - Continue trending daily CMP, mag, Phos will replace as needed.  Bronchiectasis - Seen on chest CT - Pulmonology f/u outpatient. No acute issues  Hypothyroidism - TSH elevated 16, T4 normal - Has been initiated on levothyroxine  50 mcg - Will need follow-up TSH in 4 to 6 weeks  Rheumatoid arthritis Lupus - Patient's outpatient rheumatologist, Dr. Lydia Sams, has discussed with prior attending who reports that current condition is controlled and advises continuing daily prednisone  - Continue with home dose 5 mg daily, she will follow-up outpatient to discuss alternative agents.   DVT prophylaxis: Lovenox    Code Status: Full Code Disposition: Inpatient pending safe placement.  Medically stable to discharge.  Patient appears to have regained capacity to make medical decisions.  She likely will not require a guardian, but  does still require significant social support outpatient.  Consultants:  Behavioral medicine team  Procedures:    Antimicrobials:  Anti-infectives (From admission, onward)    Start     Dose/Rate Route Frequency Ordered Stop   08/12/23 1600  cefTRIAXone  (ROCEPHIN ) 1 g in sodium chloride  0.9 % 100 mL IVPB  Status:  Discontinued        1 g 200 mL/hr over 30 Minutes Intravenous One time only - 1600 08/11/23 1644 08/11/23 1756       Data Reviewed: I have personally reviewed following labs and imaging studies CBC: Recent Labs  Lab 08/21/23 0413  WBC 6.1  HGB 9.3*  HCT 30.7*  MCV 94.5  PLT 456*   Basic Metabolic Panel: Recent Labs  Lab 08/18/23 1621 08/21/23 0413  NA 136 133*  K 4.5 4.5  CL 102 101  CO2 24 26  GLUCOSE 116* 89  BUN 29* 31*  CREATININE 0.58 0.44  CALCIUM  9.0 8.9  MG 2.0  --   PHOS 4.4  --    GFR: Estimated Creatinine Clearance: 47.4 mL/min (by C-G formula based on SCr of 0.44 mg/dL). Liver Function Tests: Recent Labs  Lab 08/18/23 1621 08/21/23 0413  AST 63* 57*  ALT 39 32  ALKPHOS 46 44  BILITOT 0.4 0.4  PROT 8.1 8.3*  ALBUMIN 2.7* 2.7*   CBG: Recent Labs  Lab 08/18/23 2107 08/19/23 0725 08/19/23 1136 08/19/23 1625 08/21/23 0723  GLUCAP 124* 85 99 105* 82    No results found for this or any previous visit (from the past 240 hours).   Radiology Studies: No results found.  Scheduled Meds:  allopurinol   100 mg Oral Daily   vitamin B-12  1,000 mcg Oral Daily   enoxaparin  (LOVENOX ) injection  30 mg Subcutaneous Q24H   feeding supplement  237 mL Oral BID BM   fluticasone   1 spray Each Nare Daily   folic acid   1 mg Oral Daily   levothyroxine   50 mcg Oral Q0600   loratadine   10 mg Oral Daily   multivitamin with minerals  1 tablet Oral Daily   pantoprazole   40 mg Oral Daily   predniSONE   5 mg Oral Daily   sodium chloride  flush  3 mL Intravenous Q12H   thiamine   100 mg Oral Daily   Continuous Infusions:   LOS: 11 days  MDM:  Patient is high risk for one or more organ failure.  They necessitate ongoing hospitalization for continued IV therapies and subsequent lab monitoring. Total time spent interpreting labs and vitals, reviewing the medical record, coordinating care amongst consultants and care team members, directly assessing and discussing care with the patient and/or family: 55 min  Ellajane Stong, DO [facility]  To contact the attending physician between 7A-7P please use Epic Chat. To contact the covering physician during after hours 7P-7A, please review Amion.  08/23/2023, 6:01 PM   *This document has been created with the assistance of dictation software. Please excuse typographical errors. *

## 2023-08-24 DIAGNOSIS — F32A Depression, unspecified: Secondary | ICD-10-CM | POA: Diagnosis not present

## 2023-08-24 DIAGNOSIS — M0609 Rheumatoid arthritis without rheumatoid factor, multiple sites: Secondary | ICD-10-CM | POA: Diagnosis not present

## 2023-08-24 DIAGNOSIS — E162 Hypoglycemia, unspecified: Secondary | ICD-10-CM | POA: Diagnosis not present

## 2023-08-24 DIAGNOSIS — E43 Unspecified severe protein-calorie malnutrition: Secondary | ICD-10-CM | POA: Diagnosis not present

## 2023-08-24 DIAGNOSIS — R42 Dizziness and giddiness: Secondary | ICD-10-CM | POA: Diagnosis not present

## 2023-08-24 LAB — CBC WITH DIFFERENTIAL/PLATELET
Abs Immature Granulocytes: 0.09 10*3/uL — ABNORMAL HIGH (ref 0.00–0.07)
Basophils Absolute: 0 10*3/uL (ref 0.0–0.1)
Basophils Relative: 0 %
Eosinophils Absolute: 0.2 10*3/uL (ref 0.0–0.5)
Eosinophils Relative: 4 %
HCT: 29.2 % — ABNORMAL LOW (ref 36.0–46.0)
Hemoglobin: 9.2 g/dL — ABNORMAL LOW (ref 12.0–15.0)
Immature Granulocytes: 1 %
Lymphocytes Relative: 26 %
Lymphs Abs: 1.7 10*3/uL (ref 0.7–4.0)
MCH: 29.3 pg (ref 26.0–34.0)
MCHC: 31.5 g/dL (ref 30.0–36.0)
MCV: 93 fL (ref 80.0–100.0)
Monocytes Absolute: 0.5 10*3/uL (ref 0.1–1.0)
Monocytes Relative: 8 %
Neutro Abs: 4.1 10*3/uL (ref 1.7–7.7)
Neutrophils Relative %: 61 %
Platelets: 428 10*3/uL — ABNORMAL HIGH (ref 150–400)
RBC: 3.14 MIL/uL — ABNORMAL LOW (ref 3.87–5.11)
RDW: 15.3 % (ref 11.5–15.5)
WBC: 6.7 10*3/uL (ref 4.0–10.5)
nRBC: 0 % (ref 0.0–0.2)

## 2023-08-24 LAB — COMPREHENSIVE METABOLIC PANEL WITH GFR
ALT: 32 U/L (ref 0–44)
AST: 62 U/L — ABNORMAL HIGH (ref 15–41)
Albumin: 2.8 g/dL — ABNORMAL LOW (ref 3.5–5.0)
Alkaline Phosphatase: 45 U/L (ref 38–126)
Anion gap: 9 (ref 5–15)
BUN: 26 mg/dL — ABNORMAL HIGH (ref 6–20)
CO2: 25 mmol/L (ref 22–32)
Calcium: 8.8 mg/dL — ABNORMAL LOW (ref 8.9–10.3)
Chloride: 103 mmol/L (ref 98–111)
Creatinine, Ser: 0.52 mg/dL (ref 0.44–1.00)
GFR, Estimated: >60 mL/min (ref >60)
Glucose, Bld: 103 mg/dL — ABNORMAL HIGH (ref 70–99)
Potassium: 3.9 mmol/L (ref 3.5–5.1)
Sodium: 137 mmol/L (ref 135–145)
Total Bilirubin: 0.4 mg/dL (ref 0.0–1.2)
Total Protein: 8.4 g/dL — ABNORMAL HIGH (ref 6.5–8.1)

## 2023-08-24 LAB — PHOSPHORUS: Phosphorus: 3.4 mg/dL (ref 2.5–4.6)

## 2023-08-24 LAB — MAGNESIUM: Magnesium: 2.1 mg/dL (ref 1.7–2.4)

## 2023-08-24 MED ORDER — MULTI-VITAMIN/MINERALS PO TABS
1.0000 | ORAL_TABLET | Freq: Every day | ORAL | 2 refills | Status: AC
Start: 2023-08-24 — End: 2024-08-23

## 2023-08-24 NOTE — Progress Notes (Signed)
 Patient being discharged, all belongings sent with patient. IV removed, discharge education and teaching provided all questions answered.
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
## 2023-08-24 NOTE — Plan of Care (Signed)
  Problem: Health Behavior/Discharge Planning: Goal: Ability to manage health-related needs will improve Outcome: Progressing   Problem: Clinical Measurements: Goal: Cardiovascular complication will be avoided Outcome: Progressing   Problem: Nutrition: Goal: Adequate nutrition will be maintained Outcome: Progressing   Problem: Pain Managment: Goal: General experience of comfort will improve and/or be controlled Outcome: Progressing

## 2023-10-31 ENCOUNTER — Other Ambulatory Visit: Payer: Self-pay

## 2023-10-31 ENCOUNTER — Inpatient Hospital Stay
Admission: EM | Admit: 2023-10-31 | Discharge: 2023-11-13 | DRG: 640 | Disposition: A | Discharge status: Skilled Nursing Facility | Attending: Family Medicine | Admitting: Family Medicine

## 2023-10-31 DIAGNOSIS — L89302 Pressure ulcer of unspecified buttock, stage 2: Secondary | ICD-10-CM | POA: Diagnosis present

## 2023-10-31 DIAGNOSIS — R627 Adult failure to thrive: Secondary | ICD-10-CM | POA: Diagnosis present

## 2023-10-31 DIAGNOSIS — Z79890 Hormone replacement therapy (postmenopausal): Secondary | ICD-10-CM

## 2023-10-31 DIAGNOSIS — E875 Hyperkalemia: Secondary | ICD-10-CM | POA: Diagnosis present

## 2023-10-31 DIAGNOSIS — E162 Hypoglycemia, unspecified: Principal | ICD-10-CM | POA: Diagnosis present

## 2023-10-31 DIAGNOSIS — R0902 Hypoxemia: Secondary | ICD-10-CM | POA: Diagnosis not present

## 2023-10-31 DIAGNOSIS — R531 Weakness: Principal | ICD-10-CM

## 2023-10-31 DIAGNOSIS — R64 Cachexia: Secondary | ICD-10-CM | POA: Diagnosis present

## 2023-10-31 DIAGNOSIS — E11649 Type 2 diabetes mellitus with hypoglycemia without coma: Secondary | ICD-10-CM | POA: Diagnosis not present

## 2023-10-31 DIAGNOSIS — E872 Acidosis, unspecified: Secondary | ICD-10-CM | POA: Diagnosis present

## 2023-10-31 DIAGNOSIS — X58XXXA Exposure to other specified factors, initial encounter: Secondary | ICD-10-CM | POA: Diagnosis present

## 2023-10-31 DIAGNOSIS — D649 Anemia, unspecified: Secondary | ICD-10-CM | POA: Diagnosis present

## 2023-10-31 DIAGNOSIS — Z5919 Other inadequate housing: Secondary | ICD-10-CM

## 2023-10-31 DIAGNOSIS — E43 Unspecified severe protein-calorie malnutrition: Secondary | ICD-10-CM | POA: Diagnosis present

## 2023-10-31 DIAGNOSIS — T7601XA Adult neglect or abandonment, suspected, initial encounter: Secondary | ICD-10-CM | POA: Diagnosis present

## 2023-10-31 DIAGNOSIS — Z7960 Long term (current) use of unspecified immunomodulators and immunosuppressants: Secondary | ICD-10-CM

## 2023-10-31 DIAGNOSIS — R000 Tachycardia, unspecified: Secondary | ICD-10-CM | POA: Diagnosis not present

## 2023-10-31 DIAGNOSIS — F32A Depression, unspecified: Secondary | ICD-10-CM | POA: Diagnosis present

## 2023-10-31 DIAGNOSIS — D696 Thrombocytopenia, unspecified: Secondary | ICD-10-CM | POA: Diagnosis not present

## 2023-10-31 DIAGNOSIS — Z681 Body mass index (BMI) 19 or less, adult: Secondary | ICD-10-CM

## 2023-10-31 DIAGNOSIS — R7989 Other specified abnormal findings of blood chemistry: Secondary | ICD-10-CM

## 2023-10-31 DIAGNOSIS — D84821 Immunodeficiency due to drugs: Secondary | ICD-10-CM | POA: Diagnosis present

## 2023-10-31 DIAGNOSIS — R42 Dizziness and giddiness: Secondary | ICD-10-CM | POA: Diagnosis not present

## 2023-10-31 DIAGNOSIS — M0609 Rheumatoid arthritis without rheumatoid factor, multiple sites: Secondary | ICD-10-CM | POA: Diagnosis present

## 2023-10-31 DIAGNOSIS — E039 Hypothyroidism, unspecified: Secondary | ICD-10-CM | POA: Diagnosis present

## 2023-10-31 DIAGNOSIS — Z8261 Family history of arthritis: Secondary | ICD-10-CM

## 2023-10-31 DIAGNOSIS — Z7952 Long term (current) use of systemic steroids: Secondary | ICD-10-CM

## 2023-10-31 DIAGNOSIS — K5900 Constipation, unspecified: Secondary | ICD-10-CM | POA: Diagnosis present

## 2023-10-31 DIAGNOSIS — M329 Systemic lupus erythematosus, unspecified: Secondary | ICD-10-CM | POA: Diagnosis present

## 2023-10-31 DIAGNOSIS — F419 Anxiety disorder, unspecified: Secondary | ICD-10-CM | POA: Diagnosis present

## 2023-10-31 DIAGNOSIS — L89153 Pressure ulcer of sacral region, stage 3: Secondary | ICD-10-CM | POA: Diagnosis present

## 2023-10-31 HISTORY — DX: Unspecified protein-calorie malnutrition: E46

## 2023-10-31 LAB — COMPREHENSIVE METABOLIC PANEL WITH GFR
ALT: 32 U/L (ref 0–44)
AST: 61 U/L — ABNORMAL HIGH (ref 15–41)
Albumin: 2.4 g/dL — ABNORMAL LOW (ref 3.5–5.0)
Alkaline Phosphatase: 68 U/L (ref 38–126)
Anion gap: 7 (ref 5–15)
BUN: 26 mg/dL — ABNORMAL HIGH (ref 6–20)
CO2: 30 mmol/L (ref 22–32)
Calcium: 12.4 mg/dL — ABNORMAL HIGH (ref 8.9–10.3)
Chloride: 102 mmol/L (ref 98–111)
Creatinine, Ser: 0.8 mg/dL (ref 0.44–1.00)
GFR, Estimated: >60 mL/min (ref >60)
Glucose, Bld: 92 mg/dL (ref 70–99)
Potassium: 3.9 mmol/L (ref 3.5–5.1)
Sodium: 139 mmol/L (ref 135–145)
Total Bilirubin: 1 mg/dL (ref 0.0–1.2)
Total Protein: 9.6 g/dL — ABNORMAL HIGH (ref 6.5–8.1)

## 2023-10-31 LAB — CBC WITH DIFFERENTIAL/PLATELET
Abs Immature Granulocytes: 0.01 K/uL (ref 0.00–0.07)
Basophils Absolute: 0 K/uL (ref 0.0–0.1)
Basophils Relative: 0 %
Eosinophils Absolute: 0.2 K/uL (ref 0.0–0.5)
Eosinophils Relative: 4 %
HCT: 35.1 % — ABNORMAL LOW (ref 36.0–46.0)
Hemoglobin: 10.5 g/dL — ABNORMAL LOW (ref 12.0–15.0)
Immature Granulocytes: 0 %
Lymphocytes Relative: 35 %
Lymphs Abs: 1.8 K/uL (ref 0.7–4.0)
MCH: 27.1 pg (ref 26.0–34.0)
MCHC: 29.9 g/dL — ABNORMAL LOW (ref 30.0–36.0)
MCV: 90.7 fL (ref 80.0–100.0)
Monocytes Absolute: 0.4 K/uL (ref 0.1–1.0)
Monocytes Relative: 7 %
Neutro Abs: 2.8 K/uL (ref 1.7–7.7)
Neutrophils Relative %: 54 %
Platelets: 203 K/uL (ref 150–400)
RBC: 3.87 MIL/uL (ref 3.87–5.11)
RDW: 16.7 % — ABNORMAL HIGH (ref 11.5–15.5)
WBC: 5.3 K/uL (ref 4.0–10.5)
nRBC: 0 % (ref 0.0–0.2)

## 2023-10-31 LAB — TROPONIN I (HIGH SENSITIVITY)
Troponin I (High Sensitivity): 16 ng/L (ref <18)
Troponin I (High Sensitivity): 12 ng/L (ref <18)

## 2023-10-31 LAB — GLUCOSE, CAPILLARY
Glucose-Capillary: 102 mg/dL — ABNORMAL HIGH (ref 70–99)
Glucose-Capillary: 89 mg/dL (ref 70–99)

## 2023-10-31 LAB — CBG MONITORING, ED
Glucose-Capillary: <10 mg/dL — CL (ref 70–99)
Glucose-Capillary: 16 mg/dL — CL (ref 70–99)
Glucose-Capillary: 26 mg/dL — CL (ref 70–99)
Glucose-Capillary: 124 mg/dL — ABNORMAL HIGH (ref 70–99)
Glucose-Capillary: 268 mg/dL — ABNORMAL HIGH (ref 70–99)
Glucose-Capillary: 227 mg/dL — ABNORMAL HIGH (ref 70–99)

## 2023-10-31 LAB — LACTIC ACID, PLASMA
Lactic Acid, Venous: 2.1 mmol/L (ref 0.5–1.9)
Lactic Acid, Venous: 4.5 mmol/L (ref 0.5–1.9)
Lactic Acid, Venous: 3.5 mmol/L (ref 0.5–1.9)

## 2023-10-31 LAB — CK: Total CK: 128 U/L (ref 38–234)

## 2023-10-31 LAB — PHOSPHORUS
Phosphorus: 1.8 mg/dL — ABNORMAL LOW (ref 2.5–4.6)
Phosphorus: 1.8 mg/dL — ABNORMAL LOW (ref 2.5–4.6)

## 2023-10-31 MED ORDER — POTASSIUM PHOSPHATES 15 MMOLE/5ML IV SOLN
30.0000 mmol | Freq: Once | INTRAVENOUS | Status: AC
  Administered 2023-10-31: 30 mmol via INTRAVENOUS
  Filled 2023-10-31: qty 10

## 2023-10-31 MED ORDER — ACETAMINOPHEN 325 MG PO TABS
650.0000 mg | ORAL_TABLET | Freq: Four times a day (QID) | ORAL | Status: AC | PRN
Start: 2023-10-31 — End: 2023-11-05
  Administered 2023-10-31: 650 mg via ORAL
  Filled 2023-10-31: qty 2

## 2023-10-31 MED ORDER — INSULIN ASPART 100 UNIT/ML IJ SOLN: 0.0000 [IU] | Freq: Every day | INTRAMUSCULAR | Status: DC

## 2023-10-31 MED ORDER — ALLOPURINOL 100 MG PO TABS: 100.0000 mg | ORAL_TABLET | Freq: Every day | ORAL | Status: DC

## 2023-10-31 MED ORDER — LACTATED RINGERS IV SOLN: INTRAVENOUS | Status: AC

## 2023-10-31 MED ORDER — THIAMINE HCL 100 MG/ML IJ SOLN
100.0000 mg | Freq: Every day | INTRAMUSCULAR | Status: AC
  Administered 2023-10-31: 100 mg via INTRAVENOUS
  Filled 2023-10-31: qty 2

## 2023-10-31 MED ORDER — THIAMINE HCL 100 MG/ML IJ SOLN: 100.0000 mg | Freq: Every day | INTRAMUSCULAR | Status: DC

## 2023-10-31 MED ORDER — SODIUM CHLORIDE 0.9 % IV SOLN
12.5000 mg | Freq: Four times a day (QID) | INTRAVENOUS | Status: DC | PRN
  Filled 2023-10-31: qty 0.5

## 2023-10-31 MED ORDER — DEXTROSE 50 % IV SOLN
INTRAVENOUS | Status: AC
  Administered 2023-10-31: 50 mL
  Filled 2023-10-31: qty 50

## 2023-10-31 MED ORDER — HEPARIN SODIUM (PORCINE) 5000 UNIT/ML IJ SOLN
5000.0000 [IU] | Freq: Three times a day (TID) | INTRAMUSCULAR | Status: DC
  Administered 2023-10-31 – 2023-11-02 (×3): 5000 [IU] via SUBCUTANEOUS
  Filled 2023-10-31 (×6): qty 1

## 2023-10-31 MED ORDER — PREDNISONE 10 MG PO TABS
5.0000 mg | ORAL_TABLET | Freq: Every day | ORAL | Status: DC
  Administered 2023-10-31 – 2023-11-13 (×14): 5 mg via ORAL
  Filled 2023-10-31 (×14): qty 1

## 2023-10-31 MED ORDER — THIAMINE MONONITRATE 100 MG PO TABS: 100.0000 mg | ORAL_TABLET | Freq: Every day | ORAL | Status: DC

## 2023-10-31 MED ORDER — LACTATED RINGERS IV BOLUS
500.0000 mL | Freq: Once | INTRAVENOUS | Status: AC
  Administered 2023-10-31: 500 mL via INTRAVENOUS

## 2023-10-31 MED ORDER — INSULIN ASPART 100 UNIT/ML IJ SOLN
0.0000 [IU] | Freq: Three times a day (TID) | INTRAMUSCULAR | Status: DC
  Administered 2023-11-03 – 2023-11-08 (×3): 1 [IU] via SUBCUTANEOUS
  Filled 2023-10-31 (×5): qty 1

## 2023-10-31 MED ORDER — DEXTROSE 10 % IV SOLN: INTRAVENOUS | Status: DC

## 2023-10-31 MED ORDER — THIAMINE MONONITRATE 100 MG PO TABS
100.0000 mg | ORAL_TABLET | Freq: Every day | ORAL | Status: AC
  Administered 2023-11-01 – 2023-11-02 (×2): 100 mg via ORAL
  Filled 2023-10-31 (×2): qty 1

## 2023-10-31 MED ORDER — BOOST / RESOURCE BREEZE PO LIQD CUSTOM
1.0000 | Freq: Three times a day (TID) | ORAL | Status: DC
  Administered 2023-10-31 (×2): 1 via ORAL

## 2023-10-31 MED ORDER — ONDANSETRON HCL 4 MG PO TABS: 4.0000 mg | ORAL_TABLET | Freq: Four times a day (QID) | ORAL | Status: AC | PRN

## 2023-10-31 MED ORDER — POLYETHYLENE GLYCOL 3350 17 G PO PACK
17.0000 g | PACK | Freq: Every day | ORAL | Status: DC | PRN
  Administered 2023-11-01 – 2023-11-05 (×3): 17 g via ORAL
  Filled 2023-10-31 (×3): qty 1

## 2023-10-31 MED ORDER — ONDANSETRON HCL 4 MG/2ML IJ SOLN
4.0000 mg | Freq: Four times a day (QID) | INTRAMUSCULAR | Status: AC | PRN
Start: 2023-10-31 — End: 2023-11-05
  Administered 2023-10-31 – 2023-11-01 (×2): 4 mg via INTRAVENOUS
  Filled 2023-10-31 (×2): qty 2

## 2023-10-31 MED ORDER — LEVOTHYROXINE SODIUM 50 MCG PO TABS
50.0000 ug | ORAL_TABLET | Freq: Every day | ORAL | Status: DC
  Administered 2023-11-03 – 2023-11-06 (×4): 50 ug via ORAL
  Filled 2023-10-31 (×6): qty 1

## 2023-10-31 MED ORDER — ACETAMINOPHEN 650 MG RE SUPP
650.0000 mg | Freq: Four times a day (QID) | RECTAL | Status: AC | PRN
Start: 2023-10-31 — End: 2023-11-05

## 2023-10-31 MED ORDER — SENNOSIDES-DOCUSATE SODIUM 8.6-50 MG PO TABS
1.0000 | ORAL_TABLET | Freq: Every evening | ORAL | Status: DC | PRN
  Administered 2023-11-02: 1 via ORAL
  Filled 2023-10-31: qty 1

## 2023-10-31 MED ORDER — DEXTROSE 50 % IV SOLN
1.0000 | Freq: Once | INTRAVENOUS | Status: AC
  Administered 2023-10-31: 50 mL via INTRAVENOUS
  Filled 2023-10-31: qty 50

## 2023-10-31 NOTE — Assessment & Plan Note (Signed)
 PT, OT, TOC have been consulted

## 2023-10-31 NOTE — Assessment & Plan Note (Addendum)
 Potassium phosphate  30 mmol and D5 infusion initiated Phosphorus recheck in a.m.

## 2023-10-31 NOTE — Assessment & Plan Note (Addendum)
 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

## 2023-10-31 NOTE — Assessment & Plan Note (Addendum)
 Heart healthy diet Boost 3 times daily between meals ordered on admission Check phosphorus level, magnesium  level in a.m. Thiamine  100 mg p.o./IV daily, 3 days ordered

## 2023-10-31 NOTE — ED Notes (Signed)
 Pt drank 8 oz of orange juice with 2 packets of sugar. Pt drank 4 oz of ensure. Pt A&Ox4.

## 2023-10-31 NOTE — ED Triage Notes (Signed)
 Pt to ED from home--lives with daughter--via [facility]. First, EMS was called by [employer] for well person check due to poor living conditions--flies everywhere, maggots, clothes and trash on the floor. When EMS arrived, pt also complaining of leg pain, generalized body pain, abdominal pain.   Pt has pressure injuries to sacral area and complains of  CBG was 47, then 44 after 2g oral glucose, just a few minutes ago. EMS BP was 120/80, T 97.7, HR around 100. Gets dizzy when stands up but was ambulatory on scene.   Pt told EMS not to let daughter make medical decisions for her.  Pt is cachetic. Is alert and oriented.

## 2023-10-31 NOTE — Assessment & Plan Note (Signed)
 Continue D10 infusion at 100 mL/h CBG check every 2 hours Resumed home prednisone  and levothyroxine  Admit to PCU, inpatient

## 2023-10-31 NOTE — Hospital Course (Signed)
 Sheila Lang is a 58 year old female with history of depression, BPPV, unsafe living condition, severe malnutrition with cachexia, hypothyroid, rheumatoid arthritis with lupus, who presents emergency department for chief concerns of a well person check.  Patient condition was poor and EMS checked patient's blood glucose which found her sugar to be low.  Vitals in the ED showed T 98.1, respiration rate 20, heart rate 101, blood pressure 101/64, SpO2 98% on room air.  Serum sodium is 139, potassium 3.9, chloride 102, bicarb 30, BUN of 26, serum creatinine 0.80, EGFR greater than 60, nonfasting blood glucose 92, WBC 5.3, hemoglobin 10.5, platelets of 203.  ED treatment: Dextrose , 1 amp, dextrose  10% gtt. at 100 mL/h, LR 500 mL liter bolus one-time dose

## 2023-10-31 NOTE — ED Notes (Signed)
 Lab called for difficult blood collection

## 2023-10-31 NOTE — H&P (Addendum)
 History and Physical   Sheila Lang FMW:[id number] DOB: 07-Aug-1965 DOA: 10/31/2023  PCP: Glover Lenis, MD  Outpatient Specialists: Dr. Tobie, rheumatologist Patient coming from: Home via EMS  I have personally briefly reviewed patient's old medical records in [facility] EMR.  Chief Concern: Hypoglycemia  HPI: [name] is a 58 year old female with history of depression, BPPV, unsafe living condition, severe malnutrition with cachexia, hypothyroid, rheumatoid arthritis with lupus, who presents emergency department for chief concerns of a well person check.  Patient condition was poor and EMS checked patient's blood glucose which found her sugar to be low.  Vitals in the ED showed T 98.1, respiration rate 20, heart rate 101, blood pressure 101/64, SpO2 98% on room air.  Serum sodium is 139, potassium 3.9, chloride 102, bicarb 30, BUN of 26, serum creatinine 0.80, EGFR greater than 60, nonfasting blood glucose 92, WBC 5.3, hemoglobin 10.5, platelets of 203.  ED treatment: Dextrose , 1 amp, dextrose  10% gtt. at 100 mL/h, LR 500 mL liter bolus one-time dose -------------------------------------- At bedside, patient able to tell me her first and last name, her age, location, current calendar year.  She reports that yesterday she had a taco.  She denies chest pain, shortness of breath, nausea, vomiting, dysuria, hematuria, diarrhea.  She endorses feeling weak and states that she has lack of appetite.  There is food at bedside that she does not want to eat.  She has asked for potato soup and per nursing, this is on his way.  Patient denies having lupus or thyroid problems.  Social history: She was living at home with her daughter.  Per nursing documentation, patient told EMS not to lift daughter or make medical decisions for her.  She denies tobacco, EtOH, recreational drug use.  ROS: Constitutional: no weight change, no fever ENT/Mouth: no sore throat, no rhinorrhea Eyes:  no eye pain, no vision changes Cardiovascular: no chest pain, no dyspnea,  no edema, no palpitations Respiratory: no cough, no sputum, no wheezing Gastrointestinal: no nausea, no vomiting, no diarrhea, no constipation Genitourinary: no urinary incontinence, no dysuria, no hematuria Musculoskeletal: no arthralgias, no myalgias Skin: no skin lesions, no pruritus, Neuro: + weakness, no loss of consciousness, no syncope Psych: no anxiety, no depression, + decrease appetite Heme/Lymph: no bruising, no bleeding  ED Course: Discussed with EDP, patient requiring hospitalization for chief concerns of hypoglycemia.  Assessment/Plan  Principal Problem:   Hypoglycemia Active Problems:   Protein-calorie malnutrition, severe (HCC) / Cachexia   Elevated lactic acid level   Hypophosphatemia   Depression   Lupus (systemic lupus erythematosus) (HCC)   Rheumatoid arthritis of multiple sites with negative rheumatoid factor (HCC)   Normocytic anemia   Hypothyroidism   Weakness   Long-term use of immunosuppressant medication   Assessment and Plan:  * Hypoglycemia Continue D10 infusion at 100 mL/h CBG check every 2 hours Resumed home prednisone  and levothyroxine  Admit to PCU, inpatient  Hypophosphatemia Potassium phosphate  30 mmol and D5 infusion initiated Phosphorus recheck in a.m.  Elevated lactic acid level With uptrending lactic acid, I suspect this is related to hypoglycemia in setting of cachexia and severely poor malnutrition, ddx includes starvation vs mild rhabo We will recheck a third lactic acid UA has been ordered and is pending collection at this time No clinical signs for infectious etiology at this time therefore antibiotic have not been initiated. Patient is hemodynamically stable at bedside. Blood cultures ordered by EDP x 2 are in process  Addendum 16:42: UA has not been  collected yet. We will follow that for ketones in the urine, check CK (added to prior collection, lab  was called to confirm this can be checked from prior collection), strict I/Os ordered  Protein-calorie malnutrition, severe (HCC) / Cachexia Heart healthy diet Boost 3 times daily between meals ordered on admission Check phosphorus level, magnesium  level in a.m. 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, C3 is low per chart review Rheumatology outpatient clinic note from 01/23/2023 reviewed Patient states at bedside that she does not have lupus Per discharge summary in May, patient will need to be continued on prednisone  5 mg daily and will need to follow-up with rheumatologist outpatient for further recommendations  Lupus (systemic lupus erythematosus) (HCC) Patient advised to follow-up with rheumatologist outpatient  Weakness PT, OT, TOC have been consulted  Hypothyroidism TSH: 5 months ago was 33.9, and 2 months ago was 16.29  Levothyroxine  50 mcg daily resumed  Normocytic anemia At baseline  Chart reviewed.   DVT prophylaxis: Heparin  5000 units subcutaneous every 8 hours Code Status: Full code Diet: heart healthy diet Family Communication: Patient wishes her cousin, [name] to b.e called and updated. I called [phone], no pick up and voicemail did not indicate a name, therefore I did  not leave a message for HIPAA protection Disposition Plan: Pending clinical course, guarded prognosis Consults called: None at this time Admission status: PCU, inpatient  Past Medical History:  Diagnosis Date   Anemia    Arthritis    Depression    Malnutrition (HCC)    Past Surgical History:  Procedure Laterality Date   OVARIAN CYST REMOVAL     Social History:  reports that she has never smoked. She has never used smokeless tobacco. She reports that she does not drink alcohol and does not use drugs.  No Known Allergies Family History   Problem Relation Age of Onset   Arthritis Father    Family history: Family history reviewed and not pertinent.  Prior to Admission medications   Medication Sig Start Date End Date Taking? Authorizing Provider  polyethylene glycol powder (GLYCOLAX /MIRALAX ) 17 GM/SCOOP powder Take 17 g by mouth daily as needed for mild constipation. 05/25/23  Yes Von Bellis, MD  predniSONE  (DELTASONE ) 5 MG tablet Take 1 tablet (5 mg total) by mouth daily. (start after completing prednisone10mg ) 06/03/23 06/02/24 Yes Von Bellis, MD  allopurinol  (ZYLOPRIM ) 100 MG tablet Take 100 mg by mouth daily. 07/14/23   [provider]  Multiple Vitamins-Minerals (MULTIVITAMIN WITH MINERALS) tablet Take 1 tablet by mouth daily. 08/24/23 08/23/24  Leesa Kast, DO   Physical Exam: Vitals:   10/31/23 1023 10/31/23 1710 10/31/23 1711 10/31/23 1754  BP:  (!) 92/56 (!) 92/56 (!) 90/44  Pulse:  (!) 108 (!) 110 98  Resp:  20 17 20   Temp:   97.8 F (36.6 C) 98.5 F (36.9 C)  TempSrc:   Oral Oral  SpO2:  100% 100% 100%  Weight: 38.6 kg     Height: 5' 2 (1.575 m)      Constitutional: appears older than chronological age, frail, cachectic, weak Eyes: PERRL, lids and conjunctivae normal HENMT: Bilateral temporal wasting, mucous membranes are moist. Posterior pharynx clear of any exudate or lesions. Age-appropriate dentition. Hearing appropriate Neck: normal, supple, no masses, no thyromegaly Respiratory: clear to auscultation bilaterally, no wheezing, no crackles. Normal respiratory effort. No accessory muscle use.  Cardiovascular: Regular  rate and rhythm, no murmurs / rubs / gallops. No extremity edema. 2+ pedal pulses. No carotid bruits.  Abdomen: Scaphoid abdomen, no tenderness, no masses palpated, no hepatosplenomegaly. Bowel sounds positive.  Musculoskeletal: no clubbing / cyanosis. No joint deformity upper and lower extremities. Good ROM, no contractures, no atrophy. Normal muscle tone.  Bilateral clavicles  are prominent. Skin: no rashes, lesions, ulcers. No induration Neurologic: Sensation intact. Strength 5/5 in all 4.  Psychiatric: Normal judgment and insight. Alert and oriented x 3. Normal mood.   EKG: independently reviewed, showing sinus rhythm with rate of 82, QTc 433  Chest x-ray on Admission: Not indicated at this time  Labs on Admission: I have personally reviewed following labs CBC: Recent Labs  Lab 10/31/23 1029  WBC 5.3  NEUTROABS 2.8  HGB 10.5*  HCT 35.1*  MCV 90.7  PLT 203   Basic Metabolic Panel: Recent Labs  Lab 10/31/23 1029 10/31/23 1314 10/31/23 1533  NA 139  --   --   K 3.9  --   --   CL 102  --   --   CO2 30  --   --   GLUCOSE 92  --   --   BUN 26*  --   --   CREATININE 0.80  --   --   CALCIUM  12.4*  --   --   PHOS  --  1.8* 1.8*   GFR: Estimated Creatinine Clearance: 46.7 mL/min (by C-G formula based on SCr of 0.8 mg/dL).  Liver Function Tests: Recent Labs  Lab 10/31/23 1029  AST 61*  ALT 32  ALKPHOS 68  BILITOT 1.0  PROT 9.6*  ALBUMIN 2.4*   CBG: Recent Labs  Lab 10/31/23 1047 10/31/23 1111 10/31/23 1138 10/31/23 1319 10/31/23 1454  GLUCAP 16* 26* 124* 268* 227*   Urine analysis:    Component Value Date/Time   COLORURINE YELLOW (A) 08/11/2023 1546   APPEARANCEUR HAZY (A) 08/11/2023 1546   LABSPEC 1.017 08/11/2023 1546   PHURINE 5.0 08/11/2023 1546   GLUCOSEU NEGATIVE 08/11/2023 1546   HGBUR NEGATIVE 08/11/2023 1546   BILIRUBINUR NEGATIVE 08/11/2023 1546   KETONESUR 5 (A) 08/11/2023 1546   PROTEINUR NEGATIVE 08/11/2023 1546   UROBILINOGEN 0.2 01/13/2021 1507   NITRITE NEGATIVE 08/11/2023 1546   LEUKOCYTESUR MODERATE (A) 08/11/2023 1546   CRITICAL CARE Performed by: Dr. Sherre  Total critical care time: 32 minutes  Critical care time was exclusive of separately billable procedures and treating other patients.  Critical care was necessary to treat or prevent imminent or life-threatening deterioration.  Critical care  was time spent personally by me on the following activities: development of treatment plan with patient, as well as nursing, discussions with consultants, evaluation of patient's response to treatment, examination of patient, obtaining history from patient or surrogate, ordering and performing treatments and interventions, ordering and review of laboratory studies, ordering and review of radiographic studies, pulse oximetry and re-evaluation of patient's condition.  This document was prepared using Dragon Voice Recognition software and may include unintentional dictation errors.  Dr. Sherre [facility]  If 7PM-7AM, please contact overnight-coverage provider If 7AM-7PM, please contact day attending provider [email or web address]  10/31/2023, 6:36 PM

## 2023-10-31 NOTE — Progress Notes (Signed)
 Patient admitted to room 223. VSS. A+Ox4. Patient bathed and settled in room. Will continue to monitor and assess with plan of care.

## 2023-10-31 NOTE — Assessment & Plan Note (Addendum)
 TSH: 5 months ago was 33.9, and 2 months ago was 16.29  Levothyroxine  50 mcg daily resumed

## 2023-10-31 NOTE — ED Provider Notes (Signed)
 [facility] Provider Note    Event Date/Time   First MD Initiated Contact with Patient 10/31/23 1026     (approximate)   History   Leg Pain and Weakness   HPI  Sheila Lang is a 58 y.o. female  severe protein calorie malnutrition, rheumatoid arthritis, lupus on chronic prednisone , who comes in with concerns for generalized body pain.  There is concern with patient's living conditions based upon EMS.  See triage note.  Patient also noted to be hypoglycemia treated with oral glucose but recheck here was also low.  Patient reports just feeling weakness all over.  She reports pain on her buttock pressure sore.  She denies any falls hitting her head.  She reports decreased p.o. intake and a little bit of abdominal cramping secondary to feeling hungry.  She denies any chest pain, shortness of breath.  On review of records patient was admitted on 08/11/2023 due to hypoglycemia and started on a D10 drip as well   Physical Exam   Triage Vital Signs: ED Triage Vitals  Encounter Vitals Group     BP 10/31/23 1010 101/64     Girls Systolic BP Percentile --      Girls Diastolic BP Percentile --      Boys Systolic BP Percentile --      Boys Diastolic BP Percentile --      Pulse Rate 10/31/23 1010 (!) 101     Resp 10/31/23 1010 20     Temp 10/31/23 1010 98.1 F (36.7 C)     Temp src --      SpO2 10/31/23 1010 98 %     Weight 10/31/23 1023 85 lb (38.6 kg)     Height 10/31/23 1023 5' 2 (1.575 m)     Head Circumference --      Peak Flow --      Pain Score 10/31/23 1014 10     Pain Loc --      Pain Education --      Exclude from Growth Chart --     Most recent vital signs: Vitals:   10/31/23 1010  BP: 101/64  Pulse: (!) 101  Resp: 20  Temp: 98.1 F (36.7 C)  SpO2: 98%     General: Awake, no distress.  CV:  Good peripheral perfusion.  Resp:  Normal effort.  Abd:  No distention.  Soft and nontender Other:  Able to lift both legs up off the bed.   Sensation intact.  She is got a small bedsore noted on her buttock with some tenderness noted to it   ED Results / Procedures / Treatments   Labs (all labs ordered are listed, but only abnormal results are displayed) Labs Reviewed  CBC WITH DIFFERENTIAL/PLATELET - Abnormal; Notable for the following components:      Result Value   Hemoglobin 10.5 (*)    HCT 35.1 (*)    MCHC 29.9 (*)    RDW 16.7 (*)    All other components within normal limits  COMPREHENSIVE METABOLIC PANEL WITH GFR - Abnormal; Notable for the following components:   BUN 26 (*)    Calcium  12.4 (*)    Total Protein 9.6 (*)    Albumin 2.4 (*)    AST 61 (*)    All other components within normal limits  LACTIC ACID, PLASMA - Abnormal; Notable for the following components:   Lactic Acid, Venous 2.1 (*)    All other components within normal limits  CBG  MONITORING, ED - Abnormal; Notable for the following components:   Glucose-Capillary <10 (*)    All other components within normal limits  CBG MONITORING, ED - Abnormal; Notable for the following components:   Glucose-Capillary 16 (*)    All other components within normal limits  CBG MONITORING, ED - Abnormal; Notable for the following components:   Glucose-Capillary 26 (*)    All other components within normal limits  CBG MONITORING, ED - Abnormal; Notable for the following components:   Glucose-Capillary 124 (*)    All other components within normal limits  CULTURE, BLOOD (ROUTINE X 2)  CULTURE, BLOOD (ROUTINE X 2)  URINALYSIS, ROUTINE W REFLEX MICROSCOPIC  LACTIC ACID, PLASMA  TROPONIN I (HIGH SENSITIVITY)  TROPONIN I (HIGH SENSITIVITY)     EKG  My interpretation of EKG:  Normal sinus rate of 82, no st elevation, no twi, normal intervals.    PROCEDURES:  Critical Care performed: Yes, see critical care procedure note(s)  .1-3 Lead EKG Interpretation  Performed by: Ernest Ronal BRAVO, MD Authorized by: Ernest Ronal BRAVO, MD     Interpretation: normal      ECG rate:  90   ECG rate assessment: normal     Rhythm: sinus rhythm     Ectopy: none     Conduction: normal   .Critical Care  Performed by: Ernest Ronal BRAVO, MD Authorized by: Ernest Ronal BRAVO, MD   Critical care provider statement:    Critical care time (minutes):  30   Critical care was necessary to treat or prevent imminent or life-threatening deterioration of the following conditions:  Endocrine crisis   Critical care was time spent personally by me on the following activities:  Development of treatment plan with patient or surrogate, discussions with consultants, evaluation of patient's response to treatment, examination of patient, ordering and review of laboratory studies, ordering and review of radiographic studies, ordering and performing treatments and interventions, pulse oximetry, re-evaluation of patient's condition and review of old charts    MEDICATIONS ORDERED IN ED: Medications  dextrose  10 % infusion ( Intravenous New Bag/Given 10/31/23 1100)  acetaminophen  (TYLENOL ) tablet 650 mg (has no administration in time range)    Or  acetaminophen  (TYLENOL ) suppository 650 mg (has no administration in time range)  ondansetron  (ZOFRAN ) tablet 4 mg (has no administration in time range)    Or  ondansetron  (ZOFRAN ) injection 4 mg (has no administration in time range)  heparin  injection 5,000 Units (has no administration in time range)  dextrose  50 % solution (50 mLs  Given 10/31/23 1036)  dextrose  50 % solution 50 mL (50 mLs Intravenous Given 10/31/23 1054)     IMPRESSION / MDM / ASSESSMENT AND PLAN / ED COURSE  I reviewed the triage vital signs and the nursing notes.   Patient's presentation is most consistent with acute presentation with potential threat to life or bodily function.   Patient comes in with a welfare check by the police found to be hypoglycemic and poor living conditions.  Her abdomen is soft and nontender she reports just feeling hungry.  She was given multiple  oral glucose medications as well as oral glucose from EMS and her sugars were still low.  Dextrose  amp was given and her sugars remained low therefore a second dextrose  amp was given plus D10.  12:03 PM repeat exam soft and non tender- pt tolerating PO   CBC shows stable hemoglobin no white count elevation to suggest severe infection.  CMP shows normal creatinine.  Troponin is negative.  Lactate slightly elevated we will give the patient some fluids.  Although at this time patient does not meet any sepsis criteria.  Will hold off on antibiotics as this seems more like a failure to thrive due to decreased p.o. intake and low sugars.  I will discuss with the hospitalist for admission.  She continue denies any abdominal pain when I did a repeat abdominal exam.   The patient is on the cardiac monitor to evaluate for evidence of arrhythmia and/or significant heart rate changes.      FINAL CLINICAL IMPRESSION(S) / ED DIAGNOSES   Final diagnoses:  Weakness  Hypoglycemia     Rx / DC Orders   ED Discharge Orders     None        Note:  This document was prepared using Dragon voice recognition software and may include unintentional dictation errors.   Ernest Ronal BRAVO, MD 10/31/23 1321

## 2023-10-31 NOTE — ED Notes (Signed)
 Assisted other tech with pulling pt up in the bed at this time.

## 2023-10-31 NOTE — Assessment & Plan Note (Signed)
 At baseline

## 2023-10-31 NOTE — ED Notes (Signed)
 1 set cultures/blue/lactic/pink tubes drawn at this time.

## 2023-10-31 NOTE — Assessment & Plan Note (Signed)
 Patient advised to follow-up with rheumatologist outpatient

## 2023-10-31 NOTE — Assessment & Plan Note (Addendum)
 With uptrending lactic acid, I suspect this is related to hypoglycemia in setting of cachexia and severely poor malnutrition, ddx includes starvation vs mild rhabo We will recheck a third lactic acid UA has been ordered and is pending collection at this time No clinical signs for infectious etiology at this time therefore antibiotic have not been initiated. Patient is hemodynamically stable at bedside. Blood cultures ordered by EDP x 2 are in process  Addendum 16:42: UA has not been collected yet. We will follow that for ketones in the urine, check CK (added to prior collection, lab was called to confirm this can be checked from prior collection), strict I/Os ordered

## 2023-10-31 NOTE — ED Notes (Signed)
 CBG reading LO on glucometer--going to tell EDP now. No IV access yet.
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
## 2023-11-01 ENCOUNTER — Observation Stay

## 2023-11-01 DIAGNOSIS — D84821 Immunodeficiency due to drugs: Secondary | ICD-10-CM | POA: Diagnosis not present

## 2023-11-01 DIAGNOSIS — F32A Depression, unspecified: Secondary | ICD-10-CM | POA: Diagnosis not present

## 2023-11-01 DIAGNOSIS — E875 Hyperkalemia: Secondary | ICD-10-CM | POA: Diagnosis not present

## 2023-11-01 DIAGNOSIS — Z79890 Hormone replacement therapy (postmenopausal): Secondary | ICD-10-CM | POA: Diagnosis not present

## 2023-11-01 DIAGNOSIS — L89302 Pressure ulcer of unspecified buttock, stage 2: Secondary | ICD-10-CM | POA: Diagnosis not present

## 2023-11-01 DIAGNOSIS — L89153 Pressure ulcer of sacral region, stage 3: Secondary | ICD-10-CM | POA: Diagnosis not present

## 2023-11-01 DIAGNOSIS — T7601XA Adult neglect or abandonment, suspected, initial encounter: Secondary | ICD-10-CM | POA: Diagnosis not present

## 2023-11-01 DIAGNOSIS — Z681 Body mass index (BMI) 19 or less, adult: Secondary | ICD-10-CM | POA: Diagnosis not present

## 2023-11-01 DIAGNOSIS — R627 Adult failure to thrive: Secondary | ICD-10-CM | POA: Diagnosis not present

## 2023-11-01 DIAGNOSIS — E43 Unspecified severe protein-calorie malnutrition: Secondary | ICD-10-CM | POA: Diagnosis not present

## 2023-11-01 DIAGNOSIS — E11649 Type 2 diabetes mellitus with hypoglycemia without coma: Secondary | ICD-10-CM | POA: Diagnosis not present

## 2023-11-01 DIAGNOSIS — Z7952 Long term (current) use of systemic steroids: Secondary | ICD-10-CM | POA: Diagnosis not present

## 2023-11-01 DIAGNOSIS — E162 Hypoglycemia, unspecified: Secondary | ICD-10-CM | POA: Diagnosis not present

## 2023-11-01 DIAGNOSIS — D696 Thrombocytopenia, unspecified: Secondary | ICD-10-CM | POA: Diagnosis not present

## 2023-11-01 DIAGNOSIS — E872 Acidosis, unspecified: Secondary | ICD-10-CM | POA: Diagnosis not present

## 2023-11-01 DIAGNOSIS — D649 Anemia, unspecified: Secondary | ICD-10-CM | POA: Diagnosis not present

## 2023-11-01 DIAGNOSIS — M0609 Rheumatoid arthritis without rheumatoid factor, multiple sites: Secondary | ICD-10-CM | POA: Diagnosis not present

## 2023-11-01 DIAGNOSIS — R112 Nausea with vomiting, unspecified: Secondary | ICD-10-CM | POA: Diagnosis not present

## 2023-11-01 DIAGNOSIS — Z8261 Family history of arthritis: Secondary | ICD-10-CM | POA: Diagnosis not present

## 2023-11-01 DIAGNOSIS — Z7960 Long term (current) use of unspecified immunomodulators and immunosuppressants: Secondary | ICD-10-CM | POA: Diagnosis not present

## 2023-11-01 DIAGNOSIS — X58XXXA Exposure to other specified factors, initial encounter: Secondary | ICD-10-CM | POA: Diagnosis present

## 2023-11-01 DIAGNOSIS — R64 Cachexia: Secondary | ICD-10-CM | POA: Diagnosis not present

## 2023-11-01 DIAGNOSIS — E039 Hypothyroidism, unspecified: Secondary | ICD-10-CM | POA: Diagnosis not present

## 2023-11-01 DIAGNOSIS — F419 Anxiety disorder, unspecified: Secondary | ICD-10-CM | POA: Diagnosis not present

## 2023-11-01 DIAGNOSIS — R531 Weakness: Secondary | ICD-10-CM | POA: Diagnosis not present

## 2023-11-01 DIAGNOSIS — M329 Systemic lupus erythematosus, unspecified: Secondary | ICD-10-CM | POA: Diagnosis not present

## 2023-11-01 DIAGNOSIS — Z5919 Other inadequate housing: Secondary | ICD-10-CM | POA: Diagnosis not present

## 2023-11-01 LAB — GLUCOSE, CAPILLARY
Glucose-Capillary: 86 mg/dL (ref 70–99)
Glucose-Capillary: 73 mg/dL (ref 70–99)
Glucose-Capillary: 64 mg/dL — ABNORMAL LOW (ref 70–99)
Glucose-Capillary: 51 mg/dL — ABNORMAL LOW (ref 70–99)
Glucose-Capillary: 149 mg/dL — ABNORMAL HIGH (ref 70–99)
Glucose-Capillary: 117 mg/dL — ABNORMAL HIGH (ref 70–99)
Glucose-Capillary: 94 mg/dL (ref 70–99)
Glucose-Capillary: 141 mg/dL — ABNORMAL HIGH (ref 70–99)

## 2023-11-01 LAB — CBC
HCT: 29.5 % — ABNORMAL LOW (ref 36.0–46.0)
Hemoglobin: 9.2 g/dL — ABNORMAL LOW (ref 12.0–15.0)
MCH: 27 pg (ref 26.0–34.0)
MCHC: 31.2 g/dL (ref 30.0–36.0)
MCV: 86.5 fL (ref 80.0–100.0)
Platelets: UNDETERMINED K/uL (ref 150–400)
RBC: 3.41 MIL/uL — ABNORMAL LOW (ref 3.87–5.11)
RDW: 16.5 % — ABNORMAL HIGH (ref 11.5–15.5)
WBC: 7.7 K/uL (ref 4.0–10.5)
nRBC: 0 % (ref 0.0–0.2)

## 2023-11-01 LAB — BASIC METABOLIC PANEL WITH GFR
Anion gap: 7 (ref 5–15)
BUN: 26 mg/dL — ABNORMAL HIGH (ref 6–20)
CO2: 29 mmol/L (ref 22–32)
Calcium: 10.8 mg/dL — ABNORMAL HIGH (ref 8.9–10.3)
Chloride: 99 mmol/L (ref 98–111)
Creatinine, Ser: 0.8 mg/dL (ref 0.44–1.00)
GFR, Estimated: >60 mL/min (ref >60)
Glucose, Bld: 82 mg/dL (ref 70–99)
Potassium: 5.3 mmol/L — ABNORMAL HIGH (ref 3.5–5.1)
Sodium: 135 mmol/L (ref 135–145)

## 2023-11-01 LAB — POTASSIUM: Potassium: 4.3 mmol/L (ref 3.5–5.1)

## 2023-11-01 LAB — MAGNESIUM: Magnesium: 1.7 mg/dL (ref 1.7–2.4)

## 2023-11-01 LAB — PHOSPHORUS: Phosphorus: 5.5 mg/dL — ABNORMAL HIGH (ref 2.5–4.6)

## 2023-11-01 LAB — LACTIC ACID, PLASMA: Lactic Acid, Venous: 1 mmol/L (ref 0.5–1.9)

## 2023-11-01 MED ORDER — SODIUM ZIRCONIUM CYCLOSILICATE 10 G PO PACK
10.0000 g | PACK | Freq: Once | ORAL | Status: AC
  Administered 2023-11-01: 10 g via ORAL
  Filled 2023-11-01: qty 1

## 2023-11-01 MED ORDER — DEXTROSE 50 % IV SOLN
1.0000 | INTRAVENOUS | Status: DC | PRN
  Administered 2023-11-01: 50 mL via INTRAVENOUS
  Filled 2023-11-01: qty 50

## 2023-11-01 MED ORDER — FAMOTIDINE 20 MG PO TABS: 20.0000 mg | ORAL_TABLET | Freq: Every day | ORAL | Status: DC | PRN

## 2023-11-01 MED ORDER — ENSURE PLUS HIGH PROTEIN PO LIQD
237.0000 mL | Freq: Two times a day (BID) | ORAL | Status: DC
  Administered 2023-11-01 – 2023-11-05 (×9): 237 mL via ORAL

## 2023-11-01 MED ORDER — OXYCODONE-ACETAMINOPHEN 5-325 MG PO TABS
1.0000 | ORAL_TABLET | Freq: Four times a day (QID) | ORAL | Status: DC | PRN
  Administered 2023-11-02 – 2023-11-04 (×4): 1 via ORAL
  Filled 2023-11-01 (×5): qty 1

## 2023-11-01 MED ORDER — PANTOPRAZOLE SODIUM 40 MG IV SOLR
40.0000 mg | INTRAVENOUS | Status: DC
  Administered 2023-11-01 – 2023-11-04 (×4): 40 mg via INTRAVENOUS
  Filled 2023-11-01 (×4): qty 10

## 2023-11-01 NOTE — Plan of Care (Signed)

## 2023-11-01 NOTE — Plan of Care (Signed)
  Problem: Education: Goal: Knowledge of General Education information will improve Description: Including pain rating scale, medication(s)/side effects and non-pharmacologic comfort measures Outcome: Progressing   Problem: Health Behavior/Discharge Planning: Goal: Ability to manage health-related needs will improve Outcome: Progressing   Problem: Clinical Measurements: Goal: Ability to maintain clinical measurements within normal limits will improve Outcome: Progressing Goal: Will remain free from infection Outcome: Progressing Goal: Diagnostic test results will improve Outcome: Progressing Goal: Respiratory complications will improve Outcome: Progressing Goal: Cardiovascular complication will be avoided Outcome: Progressing   Problem: Activity: Goal: Risk for activity intolerance will decrease Outcome: Progressing   Problem: Nutrition: Goal: Adequate nutrition will be maintained Outcome: Progressing   Problem: Elimination: Goal: Will not experience complications related to bowel motility Outcome: Progressing Goal: Will not experience complications related to urinary retention Outcome: Progressing   Problem: Pain Managment: Goal: General experience of comfort will improve and/or be controlled Outcome: Progressing   Problem: Safety: Goal: Ability to remain free from injury will improve Outcome: Progressing   Problem: Education: Goal: Ability to describe self-care measures that may prevent or decrease complications (Diabetes Survival Skills Education) will improve Outcome: Progressing Goal: Individualized Educational Video(s) Outcome: Progressing   Problem: Coping: Goal: Ability to adjust to condition or change in health will improve Outcome: Progressing   Problem: Fluid Volume: Goal: Ability to maintain a balanced intake and output will improve Outcome: Progressing   Problem: Metabolic: Goal: Ability to maintain appropriate glucose levels will  improve Outcome: Progressing   Problem: Nutritional: Goal: Maintenance of adequate nutrition will improve Outcome: Progressing Goal: Progress toward achieving an optimal weight will improve Outcome: Progressing   Problem: Tissue Perfusion: Goal: Adequacy of tissue perfusion will improve Outcome: Progressing   Problem: Skin Integrity: Goal: Risk for impaired skin integrity will decrease Outcome: Progressing

## 2023-11-01 NOTE — Progress Notes (Signed)
 PT Cancellation Note  Patient Details Name: Sheila Lang MRN: [id number] DOB: [birth year]   Cancelled Treatment:    Reason Eval/Treat Not Completed: Other (comment). Orders received and chart reviewed. Per pt, x4 episodes of emesis and nausea. Politely declining PT today due to fatigue and feeling generally unwell. Reports better participation tomorrow if she is feeling better. PT to hold until tomorrow.    [name]. Fairly IV, PT, DPT Physical Therapist- [place]  [facility] 11/01/2023, 10:53 AM

## 2023-11-01 NOTE — Progress Notes (Signed)
 OT Cancellation Note  Patient Details Name: Morgyn Marut MRN: [id number] DOB: 1966-02-17   Cancelled Treatment:    Reason Eval/Treat Not Completed: Medical issues which prohibited therapy. Pt reports she is unable to participate in OT session this date, following multiple bouts of vomiting throughout the morning. Will attempt at a later time/date, as pt is able to engage in therapy services.  Suzen Hock 11/01/2023, 11:12 AM

## 2023-11-01 NOTE — Progress Notes (Signed)
 PROGRESS NOTE    Sheila Lang  FMW:[id number] DOB: May 03, 1965 DOA: 10/31/2023 PCP: Glover Lenis, MD   Assessment & Plan:   Principal Problem:   Hypoglycemia Active Problems:   Protein-calorie malnutrition, severe (HCC) / Cachexia   Elevated lactic acid level   Hypophosphatemia   Depression   Lupus (systemic lupus erythematosus) (HCC)   Rheumatoid arthritis of multiple sites with negative rheumatoid factor (HCC)   Normocytic anemia   Hypothyroidism   Weakness   Long-term use of immunosuppressant medication  Assessment and Plan: Hypoglycemia: etiology unclear, possibly secondary to poor appetite/poor po intake. XR abd ordered. Monitor BS closes.   Hyperkalemia: etiology unclear. Lokelma  x1. Repeat potassium ordered.    Hypophosphatemia: resolved   Elevated lactic acid level: etiology unclear, possibly secondary to starvation. Blood cxs NGTD. Resolved    Severe protein calorie malnutrition: will continue on nutritional supplements    RA: continue on home dose of prednisone     Possible SLE: will need f/u outpatient w/ rhemu    Weakness: PT/OT consulted    Hypothyroidism: continue on home dose of levothyroxine     Normocytic anemia: H&H are trending down today. Will transfuse if Hb < 7.0         DVT prophylaxis:  heparin   Code Status: full Family Communication: Disposition Plan: depends on PT/OT recs  Level of care: Telemetry Medical Status is: Observation The patient remains OBS appropriate and will d/c before 2 midnights.    Consultants:    Procedures:   Antimicrobials:    Subjective: Pt c/o nausea and vomiting   Objective: Vitals:   10/31/23 1711 10/31/23 1754 10/31/23 1955 11/01/23 0431  BP: (!) 92/56 (!) 90/44 (!) 111/55 (!) 108/58  Pulse: (!) 110 98 78 91  Resp: 17 20 20 20   Temp: 97.8 F (36.6 C) 98.5 F (36.9 C) 97.8 F (36.6 C) 97.8 F (36.6 C)  TempSrc: Oral Oral Oral Oral  SpO2: 100% 100% 99% 100%  Weight:    34.1 kg   Height:        Intake/Output Summary (Last 24 hours) at 11/01/2023 0851 Last data filed at 10/31/2023 1702 Gross per 24 hour  Intake 1000 ml  Output --  Net 1000 ml   Filed Weights   10/31/23 1023 11/01/23 0431  Weight: 38.6 kg 34.1 kg    Examination:  General exam: Appears uncomfortable. Cachetic  Respiratory system: decreased breath sounds b/l  Cardiovascular system: S1 & S2 +. No rubs, gallops or clicks. Gastrointestinal system: Abdomen is nondistended, soft and nontender. Normal bowel sounds heard. Central nervous system: Alert and oriented. Moves all extremities Psychiatry: Judgement and insight appears at baseline. Flat mood and affect     Data Reviewed: I have personally reviewed following labs and imaging studies  CBC: Recent Labs  Lab 10/31/23 1029 11/01/23 0506  WBC 5.3 7.7  NEUTROABS 2.8  --   HGB 10.5* 9.2*  HCT 35.1* 29.5*  MCV 90.7 86.5  PLT 203 PLATELET CLUMPS NOTED ON SMEAR, UNABLE TO ESTIMATE   Basic Metabolic Panel: Recent Labs  Lab 10/31/23 1029 10/31/23 1314 10/31/23 1533 11/01/23 0506  NA 139  --   --  135  K 3.9  --   --  5.3*  CL 102  --   --  99  CO2 30  --   --  29  GLUCOSE 92  --   --  82  BUN 26*  --   --  26*  CREATININE 0.80  --   --  0.80  CALCIUM  12.4*  --   --  10.8*  MG  --   --   --  1.7  PHOS  --  1.8* 1.8* 5.5*   GFR: Estimated Creatinine Clearance: 41.3 mL/min (by C-G formula based on SCr of 0.8 mg/dL). Liver Function Tests: Recent Labs  Lab 10/31/23 1029  AST 61*  ALT 32  ALKPHOS 68  BILITOT 1.0  PROT 9.6*  ALBUMIN 2.4*   No results for input(s): LIPASE, AMYLASE in the last 168 hours. No results for input(s): AMMONIA in the last 168 hours. Coagulation Profile: No results for input(s): INR, PROTIME in the last 168 hours. Cardiac Enzymes: Recent Labs  Lab 10/31/23 1533  CKTOTAL 128   BNP (last 3 results) No results for input(s): PROBNP in the last 8760 hours. HbA1C: No results for  input(s): HGBA1C in the last 72 hours. CBG: Recent Labs  Lab 10/31/23 2108 10/31/23 2346 11/01/23 0154 11/01/23 0426 11/01/23 0557  GLUCAP 102* 89 86 73 64*   Lipid Profile: No results for input(s): CHOL, HDL, LDLCALC, TRIG, CHOLHDL, LDLDIRECT in the last 72 hours. Thyroid Function Tests: No results for input(s): TSH, T4TOTAL, FREET4, T3FREE, THYROIDAB in the last 72 hours. Anemia Panel: No results for input(s): VITAMINB12, FOLATE, FERRITIN, TIBC, IRON, RETICCTPCT in the last 72 hours. Sepsis Labs: Recent Labs  Lab 10/31/23 1029 10/31/23 1314 10/31/23 1533  LATICACIDVEN 2.1* 4.5* 3.5*    Recent Results (from the past 240 hours)  Blood culture (routine x 2)     Status: None (Preliminary result)   Collection Time: 10/31/23 10:31 AM   Specimen: BLOOD  Result Value Ref Range Status   Specimen Description BLOOD LEFT ANTECUBITAL  Final   Special Requests   Final    BOTTLES DRAWN AEROBIC AND ANAEROBIC Blood Culture results may not be optimal due to an inadequate volume of blood received in culture bottles   Culture   Final    NO GROWTH < 24 HOURS Performed at [facility], [address] [place], [place] [address]    Report Status PENDING  Incomplete  Blood culture (routine x 2)     Status: None (Preliminary result)   Collection Time: 10/31/23  3:33 PM   Specimen: BLOOD  Result Value Ref Range Status   Specimen Description BLOOD RFOA  Final   Special Requests   Final    BOTTLES DRAWN AEROBIC AND ANAEROBIC Blood Culture adequate volume   Culture   Final    NO GROWTH < 24 HOURS Performed at [facility], [address], [place], [place] [address]    Report Status PENDING  Incomplete         Radiology Studies: No results found.      Scheduled Meds:  feeding supplement  1 Container Oral TID BM   heparin   5,000 Units Subcutaneous Q8H   insulin  aspart  0-5 Units Subcutaneous QHS   insulin  aspart  0-9 Units  Subcutaneous TID WC   levothyroxine   50 mcg Oral Q0600   predniSONE   5 mg Oral Daily   sodium zirconium cyclosilicate   10 g Oral Once   thiamine   100 mg Oral Daily   Or   thiamine  (VITAMIN B1) injection  100 mg Intravenous Daily   Continuous Infusions:  lactated ringers  125 mL/hr at 10/31/23 1951   promethazine  (PHENERGAN ) injection (IM or IVPB)       LOS: 0 days       Anthony CHRISTELLA Pouch, MD [facility] Pager [phone]  [phone]  If 7PM-7AM, please contact night-coverage [email or web address] 11/01/2023, 8:51 AM

## 2023-11-01 NOTE — Plan of Care (Signed)
  Problem: Education: Goal: Knowledge of General Education information will improve Description: Including pain rating scale, medication(s)/side effects and non-pharmacologic comfort measures Outcome: Progressing   Problem: Health Behavior/Discharge Planning: Goal: Ability to manage health-related needs will improve Outcome: Progressing   Problem: Activity: Goal: Risk for activity intolerance will decrease Outcome: Not Progressing   Problem: Nutrition: Goal: Adequate nutrition will be maintained Outcome: Not Progressing   Problem: Coping: Goal: Level of anxiety will decrease Outcome: Not Progressing

## 2023-11-01 NOTE — Progress Notes (Signed)
 Hypoglycemic Event  CBG: 51  Treatment: D50 50 mL (25 gm)  Symptoms: None  Follow-up CBG: Time:1029 CBG Result:149  Possible Reasons for Event: Vomiting and Inadequate meal intake  Comments/MD notified:y     Taesha Goodell N Bridger Pizzi

## 2023-11-02 DIAGNOSIS — E162 Hypoglycemia, unspecified: Secondary | ICD-10-CM | POA: Diagnosis not present

## 2023-11-02 LAB — GLUCOSE, CAPILLARY
Glucose-Capillary: 105 mg/dL — ABNORMAL HIGH (ref 70–99)
Glucose-Capillary: 78 mg/dL (ref 70–99)
Glucose-Capillary: 120 mg/dL — ABNORMAL HIGH (ref 70–99)
Glucose-Capillary: 123 mg/dL — ABNORMAL HIGH (ref 70–99)

## 2023-11-02 LAB — HEMOGLOBIN A1C
Hgb A1c MFr Bld: 5 % (ref 4.8–5.6)
Mean Plasma Glucose: 97 mg/dL

## 2023-11-02 MED ORDER — MAGNESIUM SULFATE 2 GM/50ML IV SOLN
2.0000 g | Freq: Once | INTRAVENOUS | Status: AC
  Administered 2023-11-02: 2 g via INTRAVENOUS
  Filled 2023-11-02: qty 50

## 2023-11-02 MED ORDER — SCOPOLAMINE 1 MG/3DAYS TD PT72
1.0000 | MEDICATED_PATCH | TRANSDERMAL | Status: DC
  Administered 2023-11-02 – 2023-11-08 (×3): 1.5 mg via TRANSDERMAL
  Filled 2023-11-02 (×3): qty 1
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
## 2023-11-02 NOTE — Progress Notes (Signed)
 Mobility Specialist - Progress Note   11/02/23 1457  Mobility  Activity Refused mobility     Pt declined mobility, no reason specified. Pt requests to return tomorrow. Ambulated with PT earlier this date. Will attempt another date/time.    Lennette Seip Mobility Specialist 11/02/23, 2:58 PM

## 2023-11-02 NOTE — Progress Notes (Signed)
 PROGRESS NOTE    Sheila Lang  FMW:[id number] DOB: September 21, 1965 DOA: 10/31/2023 PCP: Glover Lenis, MD   Assessment & Plan:   Principal Problem:   Hypoglycemia Active Problems:   Protein-calorie malnutrition, severe (HCC) / Cachexia   Elevated lactic acid level   Hypophosphatemia   Depression   Lupus (systemic lupus erythematosus) (HCC)   Rheumatoid arthritis of multiple sites with negative rheumatoid factor (HCC)   Normocytic anemia   Hypothyroidism   Weakness   Long-term use of immunosuppressant medication  Assessment and Plan: Hypoglycemia: etiology unclear, possibly secondary to poor appetite/poor po intake. XR abd was neg. Monitor BS closes. No hypoglycemic episodes so far today   Nausea/vomiting: etiology unclear. XR abd was neg. Continue w/ scopolamine  patch, Zofran  prn   Hyperkalemia: resolved    Hypophosphatemia: resolved   Elevated lactic acid level: etiology unclear, possibly secondary to starvation. Blood cxs NGTD. Resolved    Severe protein calorie malnutrition: continue on nutritional supplements   RA: continue on home dose of prednisone     Possible SLE: will need f/u outpatient w/ rhemu    Weakness: PT recs SNF. OT consulted    Hypothyroidism: continue on home dose of levothyroxine     Normocytic anemia:H&H are trending down. Will transfuse if Hb < 7.0         DVT prophylaxis:  heparin   Code Status: full Family Communication: Disposition Plan: likely d/c to SNF   Level of care: Telemetry Medical Status is: Observation The patient remains OBS appropriate and will d/c before 2 midnights.    Consultants:    Procedures:   Antimicrobials:    Subjective: Pt c/o generalized weakness  Objective: Vitals:   11/01/23 1653 11/01/23 1946 11/02/23 0435 11/02/23 0744  BP: (!) 90/53 (!) 102/52 (!) 100/54 (!) 100/55  Pulse: 92 92 96 96  Resp: 17 18 16 16   Temp: 98.3 F (36.8 C) 98.8 F (37.1 C) 98.7 F (37.1 C) 99.1 F (37.3 C)   TempSrc:   Oral Oral  SpO2: 100% 100% 98% 100%  Weight:      Height:        Intake/Output Summary (Last 24 hours) at 11/02/2023 0855 Last data filed at 11/02/2023 0037 Gross per 24 hour  Intake 1468.8 ml  Output 900 ml  Net 568.8 ml   Filed Weights   10/31/23 1023 11/01/23 0431  Weight: 38.6 kg 34.1 kg    Examination:  General exam: Appears comfortable. Cachetic  Respiratory system: diminished breath sounds b/l  Cardiovascular system: S1/S2+. No rubs or clicks  Gastrointestinal system: Abd is soft, NT, ND & hypoactive bowel sounds  Central nervous system: alert & oriented. Moves all extremities  Psychiatry: Judgement and insight appears at baseline. Flat mood and affect     Data Reviewed: I have personally reviewed following labs and imaging studies  CBC: Recent Labs  Lab 10/31/23 1029 11/01/23 0506  WBC 5.3 7.7  NEUTROABS 2.8  --   HGB 10.5* 9.2*  HCT 35.1* 29.5*  MCV 90.7 86.5  PLT 203 PLATELET CLUMPS NOTED ON SMEAR, UNABLE TO ESTIMATE   Basic Metabolic Panel: Recent Labs  Lab 10/31/23 1029 10/31/23 1314 10/31/23 1533 11/01/23 0506 11/01/23 1447  NA 139  --   --  135  --   K 3.9  --   --  5.3* 4.3  CL 102  --   --  99  --   CO2 30  --   --  29  --   GLUCOSE 92  --   --  82  --   BUN 26*  --   --  26*  --   CREATININE 0.80  --   --  0.80  --   CALCIUM  12.4*  --   --  10.8*  --   MG  --   --   --  1.7  --   PHOS  --  1.8* 1.8* 5.5*  --    GFR: Estimated Creatinine Clearance: 41.3 mL/min (by C-G formula based on SCr of 0.8 mg/dL). Liver Function Tests: Recent Labs  Lab 10/31/23 1029  AST 61*  ALT 32  ALKPHOS 68  BILITOT 1.0  PROT 9.6*  ALBUMIN 2.4*   No results for input(s): LIPASE, AMYLASE in the last 168 hours. No results for input(s): AMMONIA in the last 168 hours. Coagulation Profile: No results for input(s): INR, PROTIME in the last 168 hours. Cardiac Enzymes: Recent Labs  Lab 10/31/23 1533  CKTOTAL 128   BNP (last 3  results) No results for input(s): PROBNP in the last 8760 hours. HbA1C: No results for input(s): HGBA1C in the last 72 hours. CBG: Recent Labs  Lab 11/01/23 1029 11/01/23 1154 11/01/23 1654 11/01/23 2136 11/02/23 0743  GLUCAP 149* 117* 94 141* 105*   Lipid Profile: No results for input(s): CHOL, HDL, LDLCALC, TRIG, CHOLHDL, LDLDIRECT in the last 72 hours. Thyroid Function Tests: No results for input(s): TSH, T4TOTAL, FREET4, T3FREE, THYROIDAB in the last 72 hours. Anemia Panel: No results for input(s): VITAMINB12, FOLATE, FERRITIN, TIBC, IRON, RETICCTPCT in the last 72 hours. Sepsis Labs: Recent Labs  Lab 10/31/23 1029 10/31/23 1314 10/31/23 1533 11/01/23 0942  LATICACIDVEN 2.1* 4.5* 3.5* 1.0    Recent Results (from the past 240 hours)  Blood culture (routine x 2)     Status: None (Preliminary result)   Collection Time: 10/31/23 10:31 AM   Specimen: BLOOD  Result Value Ref Range Status   Specimen Description BLOOD LEFT ANTECUBITAL  Final   Special Requests   Final    BOTTLES DRAWN AEROBIC AND ANAEROBIC Blood Culture results may not be optimal due to an inadequate volume of blood received in culture bottles   Culture   Final    NO GROWTH < 24 HOURS Performed at [facility], [address] [place], [place] [address]    Report Status PENDING  Incomplete  Blood culture (routine x 2)     Status: None (Preliminary result)   Collection Time: 10/31/23  3:33 PM   Specimen: BLOOD  Result Value Ref Range Status   Specimen Description BLOOD RFOA  Final   Special Requests   Final    BOTTLES DRAWN AEROBIC AND ANAEROBIC Blood Culture adequate volume   Culture   Final    NO GROWTH < 24 HOURS Performed at [facility], [address], [place], [place] [address]    Report Status PENDING  Incomplete         Radiology Studies: DG Abd Portable 1V Result Date: 11/01/2023 CLINICAL DATA:  [id number] Intractable nausea and  vomiting [id number] EXAM: PORTABLE ABDOMEN - 1 VIEW COMPARISON:  October 25, 2022. FINDINGS: Relative paucity of small bowel gas limits evaluation. No dilated loops of bowel are seen. No radio-opaque calculi or other significant radiographic abnormality are seen. Mild colonic stool burden in the LEFT hemicolon. IMPRESSION: Nonobstructive bowel gas pattern. Electronically Signed   By: Corean Salter M.D.   On: 11/01/2023 17:42        Scheduled Meds:  feeding supplement  237 mL Oral  BID BM   heparin   5,000 Units Subcutaneous Q8H   insulin  aspart  0-5 Units Subcutaneous QHS   insulin  aspart  0-9 Units Subcutaneous TID WC   levothyroxine   50 mcg Oral Q0600   pantoprazole  (PROTONIX ) IV  40 mg Intravenous Q24H   predniSONE   5 mg Oral Daily   scopolamine   1 patch Transdermal Q72H   thiamine   100 mg Oral Daily   Or   thiamine  (VITAMIN B1) injection  100 mg Intravenous Daily   Continuous Infusions:  promethazine  (PHENERGAN ) injection (IM or IVPB)       LOS: 1 day       Anthony CHRISTELLA Pouch, MD [facility] Pager [phone]  If 7PM-7AM, please contact night-coverage [email or web address] 11/02/2023, 8:55 AM
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
## 2023-11-03 DIAGNOSIS — R531 Weakness: Secondary | ICD-10-CM | POA: Diagnosis not present

## 2023-11-03 LAB — TSH: TSH: 12.283 u[IU]/mL — ABNORMAL HIGH (ref 0.350–4.500)

## 2023-11-03 LAB — COMPREHENSIVE METABOLIC PANEL WITH GFR
ALT: 24 U/L (ref 0–44)
AST: 41 U/L (ref 15–41)
Albumin: 1.9 g/dL — ABNORMAL LOW (ref 3.5–5.0)
Alkaline Phosphatase: 48 U/L (ref 38–126)
Anion gap: 5 (ref 5–15)
BUN: 27 mg/dL — ABNORMAL HIGH (ref 6–20)
CO2: 31 mmol/L (ref 22–32)
Calcium: 9.7 mg/dL (ref 8.9–10.3)
Chloride: 101 mmol/L (ref 98–111)
Creatinine, Ser: 0.68 mg/dL (ref 0.44–1.00)
GFR, Estimated: >60 mL/min (ref >60)
Glucose, Bld: 81 mg/dL (ref 70–99)
Potassium: 4.1 mmol/L (ref 3.5–5.1)
Sodium: 137 mmol/L (ref 135–145)
Total Bilirubin: 0.4 mg/dL (ref 0.0–1.2)
Total Protein: 7.9 g/dL (ref 6.5–8.1)

## 2023-11-03 LAB — CBC
HCT: 25.5 % — ABNORMAL LOW (ref 36.0–46.0)
Hemoglobin: 7.7 g/dL — ABNORMAL LOW (ref 12.0–15.0)
MCH: 26.4 pg (ref 26.0–34.0)
MCHC: 30.2 g/dL (ref 30.0–36.0)
MCV: 87.3 fL (ref 80.0–100.0)
Platelets: 131 K/uL — ABNORMAL LOW (ref 150–400)
RBC: 2.92 MIL/uL — ABNORMAL LOW (ref 3.87–5.11)
RDW: 16.5 % — ABNORMAL HIGH (ref 11.5–15.5)
WBC: 5.3 K/uL (ref 4.0–10.5)
nRBC: 0 % (ref 0.0–0.2)

## 2023-11-03 LAB — GLUCOSE, CAPILLARY
Glucose-Capillary: 55 mg/dL — ABNORMAL LOW (ref 70–99)
Glucose-Capillary: 91 mg/dL (ref 70–99)
Glucose-Capillary: 92 mg/dL (ref 70–99)
Glucose-Capillary: 128 mg/dL — ABNORMAL HIGH (ref 70–99)
Glucose-Capillary: 103 mg/dL — ABNORMAL HIGH (ref 70–99)

## 2023-11-03 LAB — MAGNESIUM: Magnesium: 1.9 mg/dL (ref 1.7–2.4)

## 2023-11-03 MED ORDER — ENOXAPARIN SODIUM 30 MG/0.3ML IJ SOSY
30.0000 mg | PREFILLED_SYRINGE | INTRAMUSCULAR | Status: DC
  Administered 2023-11-03 – 2023-11-11 (×9): 30 mg via SUBCUTANEOUS
  Filled 2023-11-03 (×8): qty 0.3
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
## 2023-11-03 NOTE — Plan of Care (Signed)
  Problem: Education: Goal: Knowledge of General Education information will improve Description: Including pain rating scale, medication(s)/side effects and non-pharmacologic comfort measures Outcome: Progressing   Problem: Nutrition: Goal: Adequate nutrition will be maintained Outcome: Progressing   Problem: Elimination: Goal: Will not experience complications related to bowel motility Outcome: Progressing Goal: Will not experience complications related to urinary retention Outcome: Progressing   
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
## 2023-11-03 NOTE — TOC PASRR Note (Signed)
 RE: Sheila Lang Date of Birth: [birth year] Date: 11/03/23      To Whom It May Concern:   Please be advised that the above-named patient will require a short-term nursing home stay - anticipated 30 days or less for rehabilitation and strengthening.  The plan is for return home

## 2023-11-03 NOTE — TOC Initial Note (Signed)
 Therapy recs for SNF.  FL2 sent for signature, bed search initiated.  PASRR pending, clinical sent to [facility].  TOC to follow up with patient to rec recs and bed offers

## 2023-11-03 NOTE — TOC CM/SW Note (Signed)
..  Transition of Care [facility]) - Inpatient Brief Assessment   Patient Details  Name: Sheila Lang MRN: [id number] Date of Birth: 17-May-1965  Transition [facility]) CM/SW Contact:    Edsel DELENA Fischer, LCSW Phone Number: 11/03/2023, 2:09 PM   Clinical Narrative:  SW received vm from [facility] [phone])  APS is involved with pt and family. Case is open.  Also, detective Delon Franks has been assigned to the case.   Transition of Care Asessment:

## 2023-11-04 ENCOUNTER — Inpatient Hospital Stay

## 2023-11-04 DIAGNOSIS — N281 Cyst of kidney, acquired: Secondary | ICD-10-CM | POA: Diagnosis not present

## 2023-11-04 DIAGNOSIS — E162 Hypoglycemia, unspecified: Secondary | ICD-10-CM | POA: Diagnosis not present

## 2023-11-04 DIAGNOSIS — R634 Abnormal weight loss: Secondary | ICD-10-CM | POA: Diagnosis not present

## 2023-11-04 DIAGNOSIS — R932 Abnormal findings on diagnostic imaging of liver and biliary tract: Secondary | ICD-10-CM | POA: Diagnosis not present

## 2023-11-04 LAB — COMPREHENSIVE METABOLIC PANEL WITH GFR
ALT: 25 U/L (ref 0–44)
AST: 43 U/L — ABNORMAL HIGH (ref 15–41)
Albumin: 1.8 g/dL — ABNORMAL LOW (ref 3.5–5.0)
Alkaline Phosphatase: 50 U/L (ref 38–126)
Anion gap: 3 — ABNORMAL LOW (ref 5–15)
BUN: 33 mg/dL — ABNORMAL HIGH (ref 6–20)
CO2: 31 mmol/L (ref 22–32)
Calcium: 9 mg/dL (ref 8.9–10.3)
Chloride: 103 mmol/L (ref 98–111)
Creatinine, Ser: 0.79 mg/dL (ref 0.44–1.00)
GFR, Estimated: >60 mL/min (ref >60)
Glucose, Bld: 85 mg/dL (ref 70–99)
Potassium: 4.5 mmol/L (ref 3.5–5.1)
Sodium: 137 mmol/L (ref 135–145)
Total Bilirubin: 0.2 mg/dL (ref 0.0–1.2)
Total Protein: 7.2 g/dL (ref 6.5–8.1)

## 2023-11-04 LAB — CBC
HCT: 25.3 % — ABNORMAL LOW (ref 36.0–46.0)
Hemoglobin: 7.7 g/dL — ABNORMAL LOW (ref 12.0–15.0)
MCH: 26.7 pg (ref 26.0–34.0)
MCHC: 30.4 g/dL (ref 30.0–36.0)
MCV: 87.8 fL (ref 80.0–100.0)
Platelets: 127 K/uL — ABNORMAL LOW (ref 150–400)
RBC: 2.88 MIL/uL — ABNORMAL LOW (ref 3.87–5.11)
RDW: 16.8 % — ABNORMAL HIGH (ref 11.5–15.5)
WBC: 4.8 K/uL (ref 4.0–10.5)
nRBC: 0 % (ref 0.0–0.2)

## 2023-11-04 LAB — GLUCOSE, CAPILLARY
Glucose-Capillary: 74 mg/dL (ref 70–99)
Glucose-Capillary: 77 mg/dL (ref 70–99)
Glucose-Capillary: 113 mg/dL — ABNORMAL HIGH (ref 70–99)
Glucose-Capillary: 118 mg/dL — ABNORMAL HIGH (ref 70–99)

## 2023-11-04 LAB — IRON AND TIBC
Iron: 56 ug/dL (ref 28–170)
Saturation Ratios: 56 % — ABNORMAL HIGH (ref 10.4–31.8)
TIBC: 101 ug/dL — ABNORMAL LOW (ref 250–450)
UIBC: 45 ug/dL

## 2023-11-04 LAB — TECHNOLOGIST SMEAR REVIEW: Plt Morphology: ADEQUATE

## 2023-11-04 LAB — MAGNESIUM: Magnesium: 2 mg/dL (ref 1.7–2.4)

## 2023-11-04 LAB — FERRITIN: Ferritin: 410 ng/mL — ABNORMAL HIGH (ref 11–307)

## 2023-11-04 MED ORDER — PANTOPRAZOLE SODIUM 40 MG PO TBEC
40.0000 mg | DELAYED_RELEASE_TABLET | Freq: Every day | ORAL | Status: DC
  Administered 2023-11-04 – 2023-11-12 (×9): 40 mg via ORAL
  Filled 2023-11-04 (×9): qty 1

## 2023-11-04 MED ORDER — OXYCODONE HCL 5 MG PO TABS
5.0000 mg | ORAL_TABLET | ORAL | Status: AC | PRN
  Administered 2023-11-04 – 2023-11-05 (×2): 5 mg via ORAL
  Filled 2023-11-04 (×2): qty 1

## 2023-11-04 MED ORDER — IOHEXOL 9 MG/ML PO SOLN
500.0000 mL | ORAL | Status: AC
  Administered 2023-11-04 (×2): 500 mL via ORAL

## 2023-11-04 MED ORDER — IOHEXOL 300 MG/ML  SOLN
75.0000 mL | Freq: Once | INTRAMUSCULAR | Status: AC | PRN
  Administered 2023-11-04: 75 mL via INTRAVENOUS

## 2023-11-04 NOTE — Plan of Care (Signed)
 Problem: Education: Goal: Knowledge of General Education information will improve Description: Including pain rating scale, medication(s)/side effects and non-pharmacologic comfort measures 11/04/2023 2321 by Mathew Rosaline BROCKS, RN Outcome: Progressing 11/04/2023 2321 by Mathew Rosaline BROCKS, RN Outcome: Progressing   Problem: Health Behavior/Discharge Planning: Goal: Ability to manage health-related needs will improve Outcome: Progressing   Problem: Clinical Measurements: Goal: Ability to maintain clinical measurements within normal limits will improve 11/04/2023 2321 by Mathew Rosaline BROCKS, RN Outcome: Progressing 11/04/2023 2321 by Mathew Rosaline BROCKS, RN Outcome: Progressing Goal: Will remain free from infection 11/04/2023 2321 by Mathew Rosaline BROCKS, RN Outcome: Progressing 11/04/2023 2321 by Mathew Rosaline BROCKS, RN Outcome: Progressing Goal: Diagnostic test results will improve 11/04/2023 2321 by Mathew Rosaline BROCKS, RN Outcome: Progressing 11/04/2023 2321 by Mathew Rosaline BROCKS, RN Outcome: Progressing Goal: Respiratory complications will improve 11/04/2023 2321 by Mathew Rosaline BROCKS, RN Outcome: Progressing 11/04/2023 2321 by Mathew Rosaline BROCKS, RN Outcome: Progressing Goal: Cardiovascular complication will be avoided 11/04/2023 2321 by Mathew Rosaline BROCKS, RN Outcome: Progressing 11/04/2023 2321 by Mathew Rosaline BROCKS, RN Outcome: Progressing   Problem: Activity: Goal: Risk for activity intolerance will decrease 11/04/2023 2321 by Mathew Rosaline BROCKS, RN Outcome: Progressing 11/04/2023 2321 by Mathew Rosaline BROCKS, RN Outcome: Progressing   Problem: Nutrition: Goal: Adequate nutrition will be maintained 11/04/2023 2321 by Mathew Rosaline BROCKS, RN Outcome: Progressing 11/04/2023 2321 by Mathew Rosaline BROCKS, RN Outcome: Progressing   Problem: Coping: Goal: Level of anxiety will decrease 11/04/2023 2321 by Mathew Rosaline BROCKS, RN Outcome: Progressing 11/04/2023 2321 by Mathew Rosaline BROCKS, RN Outcome: Progressing   Problem: Elimination: Goal: Will not experience complications related to bowel motility 11/04/2023 2321 by Mathew Rosaline BROCKS, RN Outcome: Progressing 11/04/2023 2321 by Mathew Rosaline BROCKS, RN Outcome: Progressing Goal: Will not experience complications related to urinary retention 11/04/2023 2321 by Mathew Rosaline BROCKS, RN Outcome: Progressing 11/04/2023 2321 by Mathew Rosaline BROCKS, RN Outcome: Progressing   Problem: Pain Managment: Goal: General experience of comfort will improve and/or be controlled 11/04/2023 2321 by Mathew Rosaline BROCKS, RN Outcome: Progressing 11/04/2023 2321 by Mathew Rosaline BROCKS, RN Outcome: Progressing   Problem: Safety: Goal: Ability to remain free from injury will improve 11/04/2023 2321 by Mathew Rosaline BROCKS, RN Outcome: Progressing 11/04/2023 2321 by Mathew Rosaline BROCKS, RN Outcome: Progressing   Problem: Skin Integrity: Goal: Risk for impaired skin integrity will decrease 11/04/2023 2321 by Mathew Rosaline BROCKS, RN Outcome: Progressing 11/04/2023 2321 by Mathew Rosaline BROCKS, RN Outcome: Progressing   Problem: Education: Goal: Ability to describe self-care measures that may prevent or decrease complications (Diabetes Survival Skills Education) will improve 11/04/2023 2321 by Mathew Rosaline BROCKS, RN Outcome: Progressing 11/04/2023 2321 by Mathew Rosaline BROCKS, RN Outcome: Progressing Goal: Individualized Educational Video(s) 11/04/2023 2321 by Mathew Rosaline BROCKS, RN Outcome: Progressing 11/04/2023 2321 by Mathew Rosaline BROCKS, RN Outcome: Progressing   Problem: Coping: Goal: Ability to adjust to condition or change in health will improve 11/04/2023 2321 by Mathew Rosaline BROCKS, RN Outcome: Progressing 11/04/2023 2321 by Mathew Rosaline BROCKS, RN Outcome: Progressing   Problem: Fluid Volume: Goal: Ability to maintain a balanced intake and output will improve 11/04/2023 2321 by Mathew Rosaline BROCKS, RN Outcome: Progressing 11/04/2023 2321  by Mathew Rosaline BROCKS, RN Outcome: Progressing   Problem: Health Behavior/Discharge Planning: Goal: Ability to identify and utilize available resources and services will improve 11/04/2023 2321 by Mathew Rosaline BROCKS, RN Outcome: Progressing 11/04/2023 2321 by Mathew Rosaline BROCKS, RN Outcome: Progressing Goal: Ability to manage health-related needs will improve 11/04/2023 2321  by Mathew Rosaline BROCKS, RN Outcome: Progressing 11/04/2023 2321 by Mathew Rosaline BROCKS, RN Outcome: Progressing   Problem: Metabolic: Goal: Ability to maintain appropriate glucose levels will improve 11/04/2023 2321 by Mathew Rosaline BROCKS, RN Outcome: Progressing 11/04/2023 2321 by Mathew Rosaline BROCKS, RN Outcome: Progressing   Problem: Nutritional: Goal: Maintenance of adequate nutrition will improve 11/04/2023 2321 by Mathew Rosaline BROCKS, RN Outcome: Progressing 11/04/2023 2321 by Mathew Rosaline BROCKS, RN Outcome: Progressing Goal: Progress toward achieving an optimal weight will improve 11/04/2023 2321 by Mathew Rosaline BROCKS, RN Outcome: Progressing 11/04/2023 2321 by Mathew Rosaline BROCKS, RN Outcome: Progressing   Problem: Skin Integrity: Goal: Risk for impaired skin integrity will decrease 11/04/2023 2321 by Mathew Rosaline BROCKS, RN Outcome: Progressing 11/04/2023 2321 by Mathew Rosaline BROCKS, RN Outcome: Progressing   Problem: Tissue Perfusion: Goal: Adequacy of tissue perfusion will improve 11/04/2023 2321 by Mathew Rosaline BROCKS, RN Outcome: Progressing 11/04/2023 2321 by Mathew Rosaline BROCKS, RN Outcome: Progressing

## 2023-11-04 NOTE — Progress Notes (Signed)
 PHARMACIST - PHYSICIAN COMMUNICATION  DR:   Kandis  CONCERNING: IV to Oral Route Change Policy  RECOMMENDATION: This patient is receiving Pantoprazole  by the intravenous route.  Based on criteria approved by the Pharmacy and Therapeutics Committee, the intravenous medication(s) is/are being converted to the equivalent oral dose form(s).   DESCRIPTION: These criteria include: The patient is eating (either orally or via tube) and/or has been taking other orally administered medications for a least 24 hours The patient has no evidence of active gastrointestinal bleeding or impaired GI absorption (gastrectomy, short bowel, patient on TNA or NPO).  Jadavion Spoelstra Rodriguez-Guzman PharmD, BCPS 11/04/2023 2:05 PM

## 2023-11-04 NOTE — TOC Progression Note (Signed)
 Transition of Care [facility]) - Progression Note    Patient Details  Name: Sheila Lang MRN: [id number] Date of Birth: [birth year]  [facility]) CM/SW Contact  Lauraine [name], LCSW Phone Number: 11/04/2023, 11:35 AM  Clinical Narrative:   CSW met with patient. No family at bedside. CSW introduced role and explained that PT recommendations would be discussed. Patient would like to discuss the discharge plan with her family and wants CSW to follow up tomorrow.    Barriers to Discharge: Continued Medical Work up  Expected Discharge Plan and Services In-house Referral: Clinical Social Work     Living arrangements for the past 2 months: Single Family Home                                       Social Determinants of Health (SDOH) Interventions SDOH Screenings   Food Insecurity: No Food Insecurity (10/31/2023)  Housing: Low Risk  (10/31/2023)  Transportation Needs: No Transportation Needs (10/31/2023)  Utilities: Not At Risk (10/31/2023)  Alcohol Screen: Low Risk  (05/23/2019)  Depression (PHQ2-9): Low Risk  (08/20/2020)  Social Connections: Moderately Integrated (06/28/2023)  Tobacco Use: Low Risk  (10/31/2023)    Readmission Risk Interventions     No data to display

## 2023-11-04 NOTE — TOC Progression Note (Signed)
 Transition of Care [facility]) - Progression Note    Patient Details  Name: Sheila Lang MRN: [id number] Date of Birth: Jan 04, 1966  Transition of [facility]) CM/SW Contact  Corean ONEIDA Haddock, RN Phone Number: 11/04/2023, 9:30 AM  Clinical Narrative:     PASRR [id number] E expires 12/04/23     Barriers to Discharge: Continued Medical Work up  Expected Discharge Plan and Services In-house Referral: Clinical Social Work     Living arrangements for the past 2 months: Single Family Home                                       Social Determinants of Health (SDOH) Interventions SDOH Screenings   Food Insecurity: No Food Insecurity (10/31/2023)  Housing: Low Risk  (10/31/2023)  Transportation Needs: No Transportation Needs (10/31/2023)  Utilities: Not At Risk (10/31/2023)  Alcohol Screen: Low Risk  (05/23/2019)  Depression (PHQ2-9): Low Risk  (08/20/2020)  Social Connections: Moderately Integrated (06/28/2023)  Tobacco Use: Low Risk  (10/31/2023)    Readmission Risk Interventions     No data to display
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
## 2023-11-04 NOTE — Progress Notes (Signed)
 Mobility Specialist - Progress Note   11/04/23 1000  Mobility  Activity Ambulated with assistance in hallway;Ambulated with assistance to bathroom  Level of Assistance Standby assist, set-up cues, supervision of patient - no hands on  Assistive Device Front wheel walker  Distance Ambulated (ft) 50 ft  Activity Response Tolerated well  Mobility visit 1 Mobility     Pt lying in bed upon arrival, utilizing RA. Pt agreeable to activity. Completed bed mobility modI with extra time + heavy use of bed rails. STS with minG from significantly elevated bed height. Ambulation to bathroom with minG. No LOB. Pt in squat position to urinate d/t difficulty standing from lowered surfaces (BSC not available in time). Pt does report pain in back of BLE during activity. Pt request to return to chair after ambulation. Needs in reach.    Lennette Seip Mobility Specialist 11/04/23, 11:30 AM
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
## 2023-11-04 NOTE — Plan of Care (Signed)

## 2023-11-05 DIAGNOSIS — E162 Hypoglycemia, unspecified: Secondary | ICD-10-CM | POA: Diagnosis not present

## 2023-11-05 LAB — GLUCOSE, CAPILLARY
Glucose-Capillary: 73 mg/dL (ref 70–99)
Glucose-Capillary: 94 mg/dL (ref 70–99)
Glucose-Capillary: 114 mg/dL — ABNORMAL HIGH (ref 70–99)

## 2023-11-05 LAB — CULTURE, BLOOD (ROUTINE X 2)
Culture: NO GROWTH
Culture: NO GROWTH
Special Requests: ADEQUATE

## 2023-11-05 MED ORDER — VITAMIN B-12 1000 MCG PO TABS
1000.0000 ug | ORAL_TABLET | Freq: Every day | ORAL | Status: DC
  Administered 2023-11-06 – 2023-11-13 (×8): 1000 ug via ORAL
  Filled 2023-11-05 (×8): qty 1

## 2023-11-05 MED ORDER — SENNOSIDES-DOCUSATE SODIUM 8.6-50 MG PO TABS
1.0000 | ORAL_TABLET | Freq: Every day | ORAL | Status: DC
  Administered 2023-11-06 – 2023-11-12 (×3): 1 via ORAL
  Filled 2023-11-05 (×7): qty 1

## 2023-11-05 MED ORDER — VITAMIN C 500 MG PO TABS
500.0000 mg | ORAL_TABLET | Freq: Two times a day (BID) | ORAL | Status: DC
  Administered 2023-11-05 – 2023-11-13 (×16): 500 mg via ORAL
  Filled 2023-11-05 (×16): qty 1

## 2023-11-05 MED ORDER — NAPROXEN 250 MG PO TABS
250.0000 mg | ORAL_TABLET | Freq: Every day | ORAL | Status: DC | PRN
  Administered 2023-11-07 – 2023-11-12 (×6): 250 mg via ORAL
  Filled 2023-11-05 (×9): qty 1

## 2023-11-05 MED ORDER — ACETAMINOPHEN 650 MG RE SUPP: 650.0000 mg | Freq: Four times a day (QID) | RECTAL | Status: AC | PRN

## 2023-11-05 MED ORDER — ACETAMINOPHEN 325 MG PO TABS
650.0000 mg | ORAL_TABLET | Freq: Four times a day (QID) | ORAL | Status: AC | PRN
  Administered 2023-11-05: 650 mg via ORAL
  Filled 2023-11-05: qty 2

## 2023-11-05 MED ORDER — ENSURE PLUS HIGH PROTEIN PO LIQD
237.0000 mL | Freq: Three times a day (TID) | ORAL | Status: DC
  Administered 2023-11-05 – 2023-11-13 (×23): 237 mL via ORAL

## 2023-11-05 MED ORDER — LACTULOSE 10 GM/15ML PO SOLN
30.0000 g | Freq: Once | ORAL | Status: AC
  Administered 2023-11-05: 30 g via ORAL
  Filled 2023-11-05: qty 60

## 2023-11-05 MED ORDER — POLYETHYLENE GLYCOL 3350 17 G PO PACK
34.0000 g | PACK | Freq: Every day | ORAL | Status: DC
  Administered 2023-11-06: 34 g via ORAL
  Filled 2023-11-05 (×3): qty 2

## 2023-11-05 MED ORDER — ADULT MULTIVITAMIN W/MINERALS CH
1.0000 | ORAL_TABLET | Freq: Every day | ORAL | Status: DC
  Administered 2023-11-06: 1 via ORAL
  Filled 2023-11-05: qty 1

## 2023-11-05 NOTE — Progress Notes (Signed)
 PROGRESS NOTE   HPI was taken from Dr. Sherre:  Ms. Randilyn Foisy is a 58 year old female with history of depression, BPPV, unsafe living condition, severe malnutrition with cachexia, hypothyroid, rheumatoid arthritis with lupus, who presents emergency department for chief concerns of a well person check.   Patient condition was poor and EMS checked patient's blood glucose which found her sugar to be low.   Vitals in the ED showed T 98.1, respiration rate 20, heart rate 101, blood pressure 101/64, SpO2 98% on room air.   Serum sodium is 139, potassium 3.9, chloride 102, bicarb 30, BUN of 26, serum creatinine 0.80, EGFR greater than 60, nonfasting blood glucose 92, WBC 5.3, hemoglobin 10.5, platelets of 203.   ED treatment: Dextrose , 1 amp, dextrose  10% gtt. at 100 mL/h, LR 500 mL liter bolus one-time dose -------------------------------------- At bedside, patient able to tell me her first and last name, her age, location, current calendar year.   She reports that yesterday she had a taco.  She denies chest pain, shortness of breath, nausea, vomiting, dysuria, hematuria, diarrhea.   She endorses feeling weak and states that she has lack of appetite.  There is food at bedside that she does not want to eat.  She has asked for potato soup and per nursing, this is on his way.   Patient denies having lupus or thyroid problems.    Ermie Glendenning  FMW:[id number] DOB: Dec 06, 1965 DOA: 10/31/2023 PCP: Glover Lenis, MD   Assessment & Plan:   Principal Problem:   Hypoglycemia Active Problems:   Protein-calorie malnutrition, severe (HCC) / Cachexia   Elevated lactic acid level   Hypophosphatemia   Depression   Lupus (systemic lupus erythematosus) (HCC)   Rheumatoid arthritis of multiple sites with negative rheumatoid factor (HCC)   Normocytic anemia   Hypothyroidism   Weakness   Long-term use of immunosuppressant medication  Assessment and Plan: Hypoglycemia: probably secondary to  poor appetite/poor po intake. XR abd was neg. Monitor BS closes. improved  Nausea/vomiting: etiology unclear. Much improved. XR abd was neg. Zofran  prn. Continue w/ scopolamine  patch   Thrombocytopenia: etiology unclear. Recent hcv/hiv neg. Likely 2/2 malnutrition. Smear shows platelet clumping, otherwise normal  Hyperkalemia: resolved    Hypophosphatemia: resolved   Elevated lactic acid level: etiology unclear, possibly secondary to dehydration. Blood cxs NGTD. Resolved    Severe protein calorie malnutrition: continue on nutritional supplements. Consider gi consult. CT chest few months ago nothing acute. Ditto for ct of abdomen/pelvis here.  Will place rd consult for calorie count, pending   RA: continue on home dose of prednisone    Possible SLE: will need f/u outpatient w/ rhemu    Generalized weakness: PT/OT recs SNF. Pt wants to think this over and talk over with her family first     Hypothyroidism: continue on home dose of levothyroxine . Tsh is elevated, will need to query patient about compliance, may need to increase home dose  Constipation: large stool burden on CT, patient says last BM a week ago. Will change miralax  and senna to standing and give a one-time of lactulose    Normocytic anemia: appears to be 2/2 chronic disease  FTT: APS again involved, pursuing guardianship, requests psych consult for capacity eval, consult placed    DVT prophylaxis: lovenox   Code Status: full Family Communication: none Disposition Plan: tbd  Level of care: Telemetry Medical Status is: inpt    Consultants:  Psychiatry  Procedures:   Antimicrobials:    Subjective: No complaints save for mild chronic pain  in legs  Objective: Vitals:   11/04/23 2058 11/05/23 0300 11/05/23 0754 11/05/23 1606  BP: (!) 105/57 109/71 (!) 82/67 (!) 84/51  Pulse: 97 84 85 97  Resp: 16 16 14 16   Temp: 99 F (37.2 C) 98.3 F (36.8 C) 97.9 F (36.6 C) 98.5 F (36.9 C)  TempSrc: Oral Oral Oral  Oral  SpO2: 100% 100% 100% 100%  Weight:      Height:        Intake/Output Summary (Last 24 hours) at 11/05/2023 1627 Last data filed at 11/05/2023 1300 Gross per 24 hour  Intake 1200 ml  Output 200 ml  Net 1000 ml   Filed Weights   10/31/23 1023 11/01/23 0431  Weight: 38.6 kg 34.1 kg    Examination:  General exam: appears comfortable. Cachetic  Respiratory system: decreased breath sounds b/l Cardiovascular system: S1 & S2+. No rubs or clicks   Gastrointestinal system: abd is soft, NT, scaphoid Central nervous system: alert & oriented. Moves all extremities  Psychiatry: Judgement and insight appears at baseline. Flat mood and affect    Data Reviewed: I have personally reviewed following labs and imaging studies  CBC: Recent Labs  Lab 10/31/23 1029 11/01/23 0506 11/03/23 0422 11/04/23 0502  WBC 5.3 7.7 5.3 4.8  NEUTROABS 2.8  --   --   --   HGB 10.5* 9.2* 7.7* 7.7*  HCT 35.1* 29.5* 25.5* 25.3*  MCV 90.7 86.5 87.3 87.8  PLT 203 PLATELET CLUMPS NOTED ON SMEAR, UNABLE TO ESTIMATE 131* 127*   Basic Metabolic Panel: Recent Labs  Lab 10/31/23 1029 10/31/23 1314 10/31/23 1533 11/01/23 0506 11/01/23 1447 11/03/23 0422 11/04/23 0502  NA 139  --   --  135  --  137 137  K 3.9  --   --  5.3* 4.3 4.1 4.5  CL 102  --   --  99  --  101 103  CO2 30  --   --  29  --  31 31  GLUCOSE 92  --   --  82  --  81 85  BUN 26*  --   --  26*  --  27* 33*  CREATININE 0.80  --   --  0.80  --  0.68 0.79  CALCIUM  12.4*  --   --  10.8*  --  9.7 9.0  MG  --   --   --  1.7  --  1.9 2.0  PHOS  --  1.8* 1.8* 5.5*  --   --   --    GFR: Estimated Creatinine Clearance: 41.3 mL/min (by C-G formula based on SCr of 0.79 mg/dL). Liver Function Tests: Recent Labs  Lab 10/31/23 1029 11/03/23 0422 11/04/23 0502  AST 61* 41 43*  ALT 32 24 25  ALKPHOS 68 48 50  BILITOT 1.0 0.4 0.2  PROT 9.6* 7.9 7.2  ALBUMIN 2.4* 1.9* 1.8*   No results for input(s): LIPASE, AMYLASE in the last 168  hours. No results for input(s): AMMONIA in the last 168 hours. Coagulation Profile: No results for input(s): INR, PROTIME in the last 168 hours. Cardiac Enzymes: Recent Labs  Lab 10/31/23 1533  CKTOTAL 128   BNP (last 3 results) No results for input(s): PROBNP in the last 8760 hours. HbA1C: No results for input(s): HGBA1C in the last 72 hours.  CBG: Recent Labs  Lab 11/04/23 1141 11/04/23 1727 11/04/23 2059 11/05/23 0803 11/05/23 1447  GLUCAP 77 113* 118* 73 94   Lipid Profile: No results  for input(s): CHOL, HDL, LDLCALC, TRIG, CHOLHDL, LDLDIRECT in the last 72 hours. Thyroid Function Tests: Recent Labs    11/03/23 0422  TSH 12.283*   Anemia Panel: Recent Labs    11/04/23 0502  FERRITIN 410*  TIBC 101*  IRON 56   Sepsis Labs: Recent Labs  Lab 10/31/23 1029 10/31/23 1314 10/31/23 1533 11/01/23 0942  LATICACIDVEN 2.1* 4.5* 3.5* 1.0    Recent Results (from the past 240 hours)  Blood culture (routine x 2)     Status: None   Collection Time: 10/31/23 10:31 AM   Specimen: BLOOD  Result Value Ref Range Status   Specimen Description BLOOD LEFT ANTECUBITAL  Final   Special Requests   Final    BOTTLES DRAWN AEROBIC AND ANAEROBIC Blood Culture results may not be optimal due to an inadequate volume of blood received in culture bottles   Culture   Final    NO GROWTH 5 DAYS Performed at [facility], [address] [place], [place] [address]    Report Status 11/05/2023 FINAL  Final  Blood culture (routine x 2)     Status: None   Collection Time: 10/31/23  3:33 PM   Specimen: BLOOD  Result Value Ref Range Status   Specimen Description BLOOD RFOA  Final   Special Requests   Final    BOTTLES DRAWN AEROBIC AND ANAEROBIC Blood Culture adequate volume   Culture   Final    NO GROWTH 5 DAYS Performed at [facility], [address] [place], [place] [address]    Report Status 11/05/2023 FINAL  Final          Radiology Studies: CT ABDOMEN PELVIS W CONTRAST Result Date: 11/04/2023 CLINICAL DATA:  Weight loss EXAM: CT ABDOMEN AND PELVIS WITH CONTRAST TECHNIQUE: Multidetector CT imaging of the abdomen and pelvis was performed using the standard protocol following bolus administration of intravenous contrast. RADIATION DOSE REDUCTION: This exam was performed according to the departmental dose-optimization program which includes automated exposure control, adjustment of the mA and/or kV according to patient size and/or use of iterative reconstruction technique. CONTRAST:  75mL OMNIPAQUE  IOHEXOL  300 MG/ML  SOLN COMPARISON:  CT abdomen and pelvis 10/25/2022. CT of the chest 08/13/2023. FINDINGS: Lower chest: Minimal fibrotic changes in the lung bases and bronchiectasis appear unchanged. Hepatobiliary: Subcentimeter rounded hypodensity in the peripheral liver may represent a cyst and appears unchanged. No new focal liver lesions are seen. Gallbladder and bile ducts are within normal limits. Pancreas: Unremarkable. No pancreatic ductal dilatation or surrounding inflammatory changes. Spleen: Normal in size without focal abnormality. Adrenals/Urinary Tract: Subcentimeter cyst in the inferior pole the right kidney is unchanged. Otherwise, the kidneys, adrenal glands and bladder are within normal limits. Stomach/Bowel: Stomach is within normal limits. Appendix is not seen. There is a large amount of stool throughout the colon. No evidence of bowel wall thickening, distention, or inflammatory changes. Vascular/Lymphatic: No significant vascular findings are present. No enlarged abdominal or pelvic lymph nodes. Reproductive: Uterus and bilateral adnexa are unremarkable. Other: There is a small amount of air in the subcutaneous tissues of the lower right anterior abdominal wall. There is no evidence for hernia or ascites. Musculoskeletal: No fracture is seen. IMPRESSION: 1. No acute localizing process in the abdomen or  pelvis. 2. Large amount of stool throughout the colon. 3. Small amount of air in the subcutaneous tissues of the lower right anterior abdominal wall, possibly related to recent injection. Correlate clinically. Electronically Signed   By: Greig Pique  M.D.   On: 11/04/2023 22:52         Scheduled Meds:  vitamin C   500 mg Oral BID   [START ON 11/06/2023] vitamin B-12  1,000 mcg Oral Daily   enoxaparin  (LOVENOX ) injection  30 mg Subcutaneous Q24H   feeding supplement  237 mL Oral TID BM   insulin  aspart  0-5 Units Subcutaneous QHS   insulin  aspart  0-9 Units Subcutaneous TID WC   levothyroxine   50 mcg Oral Q0600   [START ON 11/06/2023] multivitamin with minerals  1 tablet Oral Daily   pantoprazole   40 mg Oral QHS   predniSONE   5 mg Oral Daily   scopolamine   1 patch Transdermal Q72H   Continuous Infusions:  promethazine  (PHENERGAN ) injection (IM or IVPB)       LOS: 4 days       Devaughn KATHEE Ban, MD [facility]  If 7PM-7AM, please contact night-coverage [email or web address] 11/05/2023, 4:27 PM

## 2023-11-05 NOTE — TOC Progression Note (Addendum)
 Transition of Care [facility]) - Progression Note    Patient Details  Name: Sheila Lang MRN: [id number] Date of Birth: 18-Sep-1965  Transition of Care [facility]) CM/SW Contact  Lauraine [name], LCSW Phone Number: 11/05/2023, 10:16 AM  Clinical Narrative:  Patient said she's planning on getting on a [employer] bus and going back to [place]  where she is from. She is not driving because she wants to leave the car with her daughter. CSW asked how she will get to the bus station and she said she does not have a plan for that yet. CSW sent secure chat to MD to notify.  11:32 am: Spoke to DSS Child psychotherapist, Estate manager/land agent. She said patient often has plans to return to [place] but does not follow through on it. Patient told ED CSW she was fired from her job last Thursday. 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 Neale stated they are working on guardianship paperwork and they need a psych consult for capacity. MD is aware.    Barriers to Discharge: Continued Medical Work up  Expected Discharge Plan and Services In-house Referral: Clinical Social Work     Living arrangements for the past 2 months: Single Family Home                                       Social Determinants of Health (SDOH) Interventions SDOH Screenings   Food Insecurity: No Food Insecurity (10/31/2023)  Housing: Low Risk  (10/31/2023)  Transportation Needs: No Transportation Needs (10/31/2023)  Utilities: Not At Risk (10/31/2023)  Alcohol Screen: Low Risk  (05/23/2019)  Depression (PHQ2-9): Low Risk  (08/20/2020)  Social Connections: Moderately Integrated (06/28/2023)  Tobacco Use: Low Risk  (10/31/2023)    Readmission Risk Interventions     No data to display

## 2023-11-05 NOTE — Progress Notes (Signed)
 Mobility Specialist - Progress Note   11/05/23 1500  Mobility  Activity Ambulated with assistance in hallway  Level of Assistance Contact guard assist, steadying assist  Assistive Device Front wheel walker  Distance Ambulated (ft) 160 ft  Activity Response Tolerated well  Mobility visit 1 Mobility     Pt lying in bed upon arrival, utilizing RA. Pt agreeable to activity. Completed bed mobility modI with extra time + heavy use of bed rails. STS with CGA + extra time with heavy forward flexion from significantly elevated bed height. Pt ambulated hallway with CGA. 1 mild LOB. Pt reports pain in BLE and sacrum area. Pillow wedge support upon return to room. Pt left in bed with alarm set, needs in reach.    Lennette Seip Mobility Specialist 11/05/23, 3:18 PM

## 2023-11-05 NOTE — TOC Progression Note (Signed)
 Transition of Care [facility]) - Progression Note    Patient Details  Name: [name] MRN: [id number] Date of Birth: November 08, 1965  Transition [facility] Care [facility]) CM/SW Contact  Edsel DELENA Fischer, LCSW Phone Number: 11/05/2023, 10:51 AM  Clinical Narrative:     SW spoke with DSS staff Dorothy Morrow-APS supervisor and Elease Molly- assigned sw to pt case at dss).  [name] stated that she say the pt yesterday. Per DSS report: Their is an open case on this pt.  DSS does not feel as though pt is able to live alone at this time.  Seeking rehab placement and will then reevaluate if skilled placement is needed or discharge home.  Pt daughter has mental health issues as well and pt is a trigger for daughter.  Stated that pt want at at times and defecates on herself.  Believes that it could be an unknown psych issue with pt as well. Pt has medicaid [phone]) however unsure of what type of medicaid it is.     Barriers to Discharge: Continued Medical Work up  Expected Discharge Plan and Services In-house Referral: Clinical Social Work     Living arrangements for the past 2 months: Single Family Home                                       Social Determinants of Health (SDOH) Interventions SDOH Screenings   Food Insecurity: No Food Insecurity (10/31/2023)  Housing: Low Risk  (10/31/2023)  Transportation Needs: No Transportation Needs (10/31/2023)  Utilities: Not At Risk (10/31/2023)  Alcohol Screen: Low Risk  (05/23/2019)  Depression (PHQ2-9): Low Risk  (08/20/2020)  Social Connections: Moderately Integrated (06/28/2023)  Tobacco Use: Low Risk  (10/31/2023)    Readmission Risk Interventions     No data to display

## 2023-11-05 NOTE — Progress Notes (Addendum)
 Initial Nutrition Assessment  DOCUMENTATION CODES:   Severe malnutrition in context of social or environmental circumstances  INTERVENTION:   Ensure Plus High Protein po TID, each supplement provides 350 kcal and 20 grams of protein  Magic cup TID with meals, each supplement provides 290 kcal and 9 grams of protein  MVI po daily   Cyanocobalamin  1000mcg po daily    Vitamin C  500mg  po BID  Liberalize diet   Pt at high refeed risk; recommend monitor potassium, magnesium  and phosphorus labs daily until stable  Daily weights   NUTRITION DIAGNOSIS:   Severe Malnutrition related to social / environmental circumstances as evidenced by percent weight loss, severe fat depletion, severe muscle depletion.  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  Met with pt in room today. Pt is well known to nutrition department from previous admissions. Pt reports good appetite and oral intake at baseline but reports that despite eating well at home she has continued to loose weight. Pt reports that she has lost down from 120lbs over the past 6 months. Per chart, pt is down 46lbs(37%) over the past year but appears weight stable for the past 6 months; this is severe weight loss.   Pt denies any history of gastric surgeries or resections except for an ovary. Pt denies any etoh or substance abuse. Pt reports some nausea and vomiting at the beginning of this admission but reports that she does not usually vomit at home. Pt denies any diarrhea or bloody stools. Fecal fat  checked in April and was normal. Fecal elastase has not been checked to r/o EPI. Pt has reported food insecurity on prior admissions reporting sometimes going up to 5 days without food. Pt noted to be living in unsanitary conditions. When asked about food insecurity today, pt reports that the church has been providing meals for her and she is able to sometimes get vanilla nutrition shakes from [employer]. Pt reports taking a multivitamin and B12 supplement daily. Pt denies any history of an eating disorder. No psychiatric disorders noted. Pt denies any purging or laxative use other than for occasional constipation. Pt is noted to have stool burden on CT scan.   Pt eating an omelet today during RD visit. Pt is documented to be eating 40-100% of meals in hospital and is drinking Ensure supplements (prefers vanilla). RD will add Magic Cups to meal trays and liberalize pt's diet. Will add vitamin C  to support wound healing. Can consider G-tube placement and nutrition support if plan is for discharge to LTC facility. RD suspects pt would not be compliant with tube feeds if discharged home.   If weight loss is related to starvation vs catabolic illness would expect to see evidence of refeeding on labs over the next few days.    Medications reviewed and include: vitamin C , lovenox , insulin , synthroid , protonix , miralax , prednisone , scopolamine , senokot    Labs reviewed: K 4.5 wnl, BUN 33(H), Mg 2.0 wnl Iron 56, TIBC 101(L), ferritin 410(H)- 7/16 Folate >40- 4/22 B1 77.0, vitamin D  44.44 wnl, B12 304- 4/22 Cbgs- 94, 73 x 24 hrs  AIC 5.0- 7/12  NUTRITION - FOCUSED PHYSICAL EXAM:  Flowsheet Row Most Recent Value  Orbital Region Moderate depletion  Upper Arm Region Severe depletion  Thoracic and Lumbar Region Severe depletion  Buccal Region Moderate depletion  Temple Region Moderate depletion  Clavicle Bone Region Severe depletion  Clavicle and Acromion Bone Region Severe depletion  Scapular Bone Region  Severe depletion  Dorsal Hand Severe depletion  Patellar Region Severe depletion  Anterior Thigh Region Severe depletion  Posterior Calf Region Severe depletion  Edema (RD Assessment) None  Hair Reviewed  Eyes Reviewed  Mouth Reviewed  Skin Reviewed  Nails Reviewed   Diet Order:   Diet Order             Diet regular Room service appropriate? Yes; Fluid consistency: Thin  Diet effective now                  EDUCATION NEEDS:   Education needs have been addressed  Skin:  Skin Assessment: Reviewed RN Assessment (Stage II sacrum)  Last BM:  7/17- type 5  Height:   Ht Readings from Last 1 Encounters:  10/31/23 5' 2 (1.575 m)    Weight:   Wt Readings from Last 1 Encounters:  11/01/23 34.1 kg    Ideal Body Weight:  50 kg  BMI:  Body mass index is 13.75 kg/m.  Estimated Nutritional Needs:   Kcal:  1300-1500kcal/day  Protein:  60-70g/day  Fluid:  1.0-1.2L/day  Augustin Shams MS, RD, LDN If unable to be reached, please send secure chat to RD inpatient available from 8:00a-4:00p daily

## 2023-11-05 NOTE — Plan of Care (Signed)
  Problem: Education: Goal: Knowledge of General Education information will improve Description: Including pain rating scale, medication(s)/side effects and non-pharmacologic comfort measures Outcome: Progressing   Problem: Health Behavior/Discharge Planning: Goal: Ability to manage health-related needs will improve Outcome: Progressing   Problem: Clinical Measurements: Goal: Ability to maintain clinical measurements within normal limits will improve Outcome: Progressing Goal: Diagnostic test results will improve Outcome: Progressing Goal: Respiratory complications will improve Outcome: Progressing   Problem: Activity: Goal: Risk for activity intolerance will decrease Outcome: Progressing   

## 2023-11-06 DIAGNOSIS — E162 Hypoglycemia, unspecified: Secondary | ICD-10-CM | POA: Diagnosis not present

## 2023-11-06 LAB — BASIC METABOLIC PANEL WITH GFR
Anion gap: 6 (ref 5–15)
BUN: 33 mg/dL — ABNORMAL HIGH (ref 6–20)
CO2: 27 mmol/L (ref 22–32)
Calcium: 9.1 mg/dL (ref 8.9–10.3)
Chloride: 100 mmol/L (ref 98–111)
Creatinine, Ser: 0.69 mg/dL (ref 0.44–1.00)
GFR, Estimated: >60 mL/min (ref >60)
Glucose, Bld: 83 mg/dL (ref 70–99)
Potassium: 4.3 mmol/L (ref 3.5–5.1)
Sodium: 133 mmol/L — ABNORMAL LOW (ref 135–145)

## 2023-11-06 LAB — GLUCOSE, CAPILLARY
Glucose-Capillary: 64 mg/dL — ABNORMAL LOW (ref 70–99)
Glucose-Capillary: 71 mg/dL (ref 70–99)
Glucose-Capillary: 105 mg/dL — ABNORMAL HIGH (ref 70–99)
Glucose-Capillary: 106 mg/dL — ABNORMAL HIGH (ref 70–99)
Glucose-Capillary: 113 mg/dL — ABNORMAL HIGH (ref 70–99)

## 2023-11-06 LAB — PHOSPHORUS: Phosphorus: 3.6 mg/dL (ref 2.5–4.6)

## 2023-11-06 LAB — MAGNESIUM: Magnesium: 2.2 mg/dL (ref 1.7–2.4)

## 2023-11-06 MED ORDER — LACTULOSE 10 GM/15ML PO SOLN
30.0000 g | Freq: Once | ORAL | Status: AC
  Administered 2023-11-06: 30 g via ORAL
  Filled 2023-11-06: qty 60

## 2023-11-06 MED ORDER — LEVOTHYROXINE SODIUM 75 MCG PO TABS
75.0000 ug | ORAL_TABLET | Freq: Every day | ORAL | Status: DC
  Administered 2023-11-07 – 2023-11-13 (×7): 75 ug via ORAL
  Filled 2023-11-06 (×7): qty 1
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
## 2023-11-06 NOTE — Progress Notes (Signed)
 Mobility Specialist - Progress Note   11/06/23 1600  Mobility  Activity Refused mobility     Pt requesting to wait til tomorrow for OOB mobility. Will attempt another date/time.    Lennette Seip Mobility Specialist 11/06/23, 4:16 PM

## 2023-11-06 NOTE — Progress Notes (Signed)
 OT Cancellation Note  Patient Details Name: Sheila Lang MRN: [id number] DOB: [birth year]   Cancelled Treatment:    Reason Eval/Treat Not Completed: Patient declined, no reason specified.  Patient reported that she was too tired and to come back another day.   [name] 11/06/2023, 12:01 PM
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
## 2023-11-06 NOTE — Plan of Care (Signed)

## 2023-11-07 DIAGNOSIS — E162 Hypoglycemia, unspecified: Secondary | ICD-10-CM | POA: Diagnosis not present

## 2023-11-07 LAB — GLUCOSE, CAPILLARY
Glucose-Capillary: 101 mg/dL — ABNORMAL HIGH (ref 70–99)
Glucose-Capillary: 101 mg/dL — ABNORMAL HIGH (ref 70–99)
Glucose-Capillary: 128 mg/dL — ABNORMAL HIGH (ref 70–99)
Glucose-Capillary: 116 mg/dL — ABNORMAL HIGH (ref 70–99)

## 2023-11-07 MED ORDER — LACTULOSE 10 GM/15ML PO SOLN
30.0000 g | Freq: Once | ORAL | Status: AC
  Administered 2023-11-07: 30 g via ORAL
  Filled 2023-11-07: qty 60
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
## 2023-11-07 NOTE — Plan of Care (Signed)
  Problem: Education: Goal: Knowledge of General Education information will improve Description: Including pain rating scale, medication(s)/side effects and non-pharmacologic comfort measures Outcome: Progressing   Problem: Health Behavior/Discharge Planning: Goal: Ability to manage health-related needs will improve Outcome: Progressing   Problem: Clinical Measurements: Goal: Ability to maintain clinical measurements within normal limits will improve Outcome: Progressing Goal: Diagnostic test results will improve Outcome: Progressing Goal: Respiratory complications will improve Outcome: Progressing Goal: Cardiovascular complication will be avoided Outcome: Progressing   Problem: Activity: Goal: Risk for activity intolerance will decrease Outcome: Progressing   Problem: Coping: Goal: Level of anxiety will decrease Outcome: Progressing   Problem: Elimination: Goal: Will not experience complications related to bowel motility Outcome: Progressing   Problem: Pain Managment: Goal: General experience of comfort will improve and/or be controlled Outcome: Progressing   Problem: Safety: Goal: Ability to remain free from injury will improve Outcome: Progressing

## 2023-11-08 DIAGNOSIS — E162 Hypoglycemia, unspecified: Secondary | ICD-10-CM | POA: Diagnosis not present

## 2023-11-08 LAB — BASIC METABOLIC PANEL WITH GFR
Anion gap: 4 — ABNORMAL LOW (ref 5–15)
BUN: 38 mg/dL — ABNORMAL HIGH (ref 6–20)
CO2: 28 mmol/L (ref 22–32)
Calcium: 9.1 mg/dL (ref 8.9–10.3)
Chloride: 104 mmol/L (ref 98–111)
Creatinine, Ser: 0.59 mg/dL (ref 0.44–1.00)
GFR, Estimated: >60 mL/min (ref >60)
Glucose, Bld: 81 mg/dL (ref 70–99)
Potassium: 4.4 mmol/L (ref 3.5–5.1)
Sodium: 136 mmol/L (ref 135–145)

## 2023-11-08 LAB — CBC
HCT: 23.7 % — ABNORMAL LOW (ref 36.0–46.0)
Hemoglobin: 7.4 g/dL — ABNORMAL LOW (ref 12.0–15.0)
MCH: 27.4 pg (ref 26.0–34.0)
MCHC: 31.2 g/dL (ref 30.0–36.0)
MCV: 87.8 fL (ref 80.0–100.0)
Platelets: 142 K/uL — ABNORMAL LOW (ref 150–400)
RBC: 2.7 MIL/uL — ABNORMAL LOW (ref 3.87–5.11)
RDW: 17.6 % — ABNORMAL HIGH (ref 11.5–15.5)
WBC: 6.1 K/uL (ref 4.0–10.5)
nRBC: 0 % (ref 0.0–0.2)

## 2023-11-08 LAB — GLUCOSE, CAPILLARY
Glucose-Capillary: 90 mg/dL (ref 70–99)
Glucose-Capillary: 111 mg/dL — ABNORMAL HIGH (ref 70–99)
Glucose-Capillary: 122 mg/dL — ABNORMAL HIGH (ref 70–99)
Glucose-Capillary: 127 mg/dL — ABNORMAL HIGH (ref 70–99)

## 2023-11-08 LAB — PHOSPHORUS: Phosphorus: 3 mg/dL (ref 2.5–4.6)

## 2023-11-08 LAB — MAGNESIUM: Magnesium: 2.1 mg/dL (ref 1.7–2.4)

## 2023-11-08 MED ORDER — POLYETHYLENE GLYCOL 3350 17 G PO PACK
17.0000 g | PACK | Freq: Every day | ORAL | Status: DC
  Administered 2023-11-13: 17 g via ORAL
  Filled 2023-11-08 (×3): qty 1

## 2023-11-08 NOTE — Progress Notes (Signed)
   11/07/23 1536  Assess: MEWS Score  Temp 98.9 F (37.2 C)  BP (!) 91/50  MAP (mmHg) (!) 62  Pulse Rate (!) 108  Resp 17  SpO2 100 %  O2 Device Room Air  Assess: MEWS Score  MEWS Temp 0  MEWS Systolic 1  MEWS Pulse 1  MEWS RR 0  MEWS LOC 0  MEWS Score 2  MEWS Score Color Yellow  Assess: if the MEWS score is Yellow or Red  Were vital signs accurate and taken at a resting state? Yes  Does the patient meet 2 or more of the SIRS criteria? No  MEWS guidelines implemented  Yes, yellow  Treat  MEWS Interventions Considered administering scheduled or prn medications/treatments as ordered  Take Vital Signs  Increase Vital Sign Frequency  Yellow: Q2hr x1, continue Q4hrs until patient remains green for 12hrs  Escalate  MEWS: Escalate Yellow: Discuss with charge nurse and consider notifying provider and/or RRT  Notify: Charge Nurse/RN  Name of Charge Nurse/RN Notified Haematologist  Provider Notification  Provider Name/Title Dr Kandis  Date Provider Notified 11/07/23  Time Provider Notified 1608  Method of Notification Page  Notification Reason Other (Comment) (HR 110s)  Provider response No new orders  Date of Provider Response 11/07/23  Time of Provider Response 1610  Assess: SIRS CRITERIA  SIRS Temperature  0  SIRS Respirations  0  SIRS Pulse 1  SIRS WBC 0  SIRS Score Sum  1

## 2023-11-08 NOTE — Plan of Care (Signed)
  Problem: Education: Goal: Knowledge of General Education information will improve Description: Including pain rating scale, medication(s)/side effects and non-pharmacologic comfort measures Outcome: Progressing   Problem: Activity: Goal: Risk for activity intolerance will decrease Outcome: Progressing   Problem: Nutrition: Goal: Adequate nutrition will be maintained Outcome: Progressing   Problem: Safety: Goal: Ability to remain free from injury will improve Outcome: Progressing   Problem: Nutritional: Goal: Progress toward achieving an optimal weight will improve Outcome: Progressing
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
## 2023-11-09 DIAGNOSIS — E162 Hypoglycemia, unspecified: Secondary | ICD-10-CM | POA: Diagnosis not present

## 2023-11-09 LAB — GLUCOSE, CAPILLARY
Glucose-Capillary: 67 mg/dL — ABNORMAL LOW (ref 70–99)
Glucose-Capillary: 112 mg/dL — ABNORMAL HIGH (ref 70–99)
Glucose-Capillary: 85 mg/dL (ref 70–99)
Glucose-Capillary: 120 mg/dL — ABNORMAL HIGH (ref 70–99)
Glucose-Capillary: 126 mg/dL — ABNORMAL HIGH (ref 70–99)
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
## 2023-11-09 NOTE — Plan of Care (Signed)
  Problem: Education: Goal: Knowledge of General Education information will improve Description: Including pain rating scale, medication(s)/side effects and non-pharmacologic comfort measures Outcome: Progressing   Problem: Health Behavior/Discharge Planning: Goal: Ability to manage health-related needs will improve Outcome: Progressing   Problem: Clinical Measurements: Goal: Ability to maintain clinical measurements within normal limits will improve Outcome: Progressing Goal: Will remain free from infection Outcome: Progressing Goal: Diagnostic test results will improve Outcome: Progressing   Problem: Activity: Goal: Risk for activity intolerance will decrease Outcome: Progressing   Problem: Nutrition: Goal: Adequate nutrition will be maintained Outcome: Progressing   Problem: Elimination: Goal: Will not experience complications related to bowel motility Outcome: Progressing Goal: Will not experience complications related to urinary retention Outcome: Progressing   

## 2023-11-09 NOTE — Plan of Care (Signed)
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
## 2023-11-09 NOTE — Inpatient Diabetes Management (Signed)
 Inpatient Diabetes Program Recommendations  AACE/ADA: New Consensus Statement on Inpatient Glycemic Control (2015)  Target Ranges:  Prepandial:   less than 140 mg/dL      Peak postprandial:   less than 180 mg/dL (1-2 hours)      Critically ill patients:  140 - 180 mg/dL   Lab Results  Component Value Date   GLUCAP 112 (H) 11/09/2023   HGBA1C 5.0 10/31/2023    Review of Glycemic Control  Latest Reference Range & Units 11/08/23 16:11 11/08/23 20:52 11/09/23 08:31 11/09/23 09:49  Glucose-Capillary 70 - 99 mg/dL 877 (H) 872 (H) 67 (L) 112 (H)   Diabetes history: None Outpatient Diabetes medications:  None Current orders for Inpatient glycemic control:  Novolog  0-9 units tid with meals and HS  Inpatient Diabetes Program Recommendations:   Note low blood sugar this AM. Consider reducing Novolog  correction to very sensitive (0-6 units).   Thanks,  Randall Bullocks, RN, BC-ADM Inpatient Diabetes Coordinator Pager [phone]  (8a-5p)

## 2023-11-10 DIAGNOSIS — E162 Hypoglycemia, unspecified: Secondary | ICD-10-CM | POA: Diagnosis not present

## 2023-11-10 LAB — GLUCOSE, CAPILLARY: Glucose-Capillary: 101 mg/dL — ABNORMAL HIGH (ref 70–99)

## 2023-11-10 MED ORDER — ACETAMINOPHEN 325 MG PO TABS
650.0000 mg | ORAL_TABLET | Freq: Four times a day (QID) | ORAL | Status: DC | PRN
  Administered 2023-11-10 – 2023-11-11 (×2): 650 mg via ORAL
  Filled 2023-11-10 (×2): qty 2

## 2023-11-10 NOTE — Progress Notes (Signed)
 Spoke with patient and made her aware that psychiatry will come se her tomorrow. Patient pleased with this information. This was discussed with Jadapalle, MD in secure chat prior to conversation with patient.

## 2023-11-10 NOTE — TOC Progression Note (Signed)
 Transition of Care [facility]) - Progression Note    Patient Details  Name: Sheila Lang MRN: [id number] Date of Birth: 02-Apr-1966  Transition of [facility]) CM/SW Contact  Corean ONEIDA Haddock, RN Phone Number: 11/10/2023, 4:01 PM  Clinical Narrative:     Patient requesting to see TOC at bedside Patient adamantly states that she can make her own diecions and will be leaving the hospital to either return home or to [place]   Medical/psych/leadership notified  Per Psych patient still does not have capacity Per psych while dss guardianship is pending daughter to make decision  VM left for [name] with DSS WM left for daughter to discuss disposition     Barriers to Discharge: Continued Medical Work up               Expected Discharge Plan and Services In-house Referral: Clinical Social Work     Living arrangements for the past 2 months: Single Family Home                                       Social Drivers of Health (SDOH) Interventions SDOH Screenings   Food Insecurity: No Food Insecurity (10/31/2023)  Housing: Low Risk  (10/31/2023)  Transportation Needs: No Transportation Needs (10/31/2023)  Utilities: Not At Risk (10/31/2023)  Alcohol Screen: Low Risk  (05/23/2019)  Depression (PHQ2-9): Low Risk  (08/20/2020)  Social Connections: Moderately Integrated (06/28/2023)  Tobacco Use: Low Risk  (10/31/2023)    Readmission Risk Interventions     No data to display

## 2023-11-10 NOTE — Plan of Care (Signed)
   Problem: Education: Goal: Knowledge of General Education information will improve Description: Including pain rating scale, medication(s)/side effects and non-pharmacologic comfort measures Outcome: Progressing   Problem: Elimination: Goal: Will not experience complications related to bowel motility Outcome: Progressing   Problem: Pain Managment: Goal: General experience of comfort will improve and/or be controlled Outcome: Progressing   Problem: Safety: Goal: Ability to remain free from injury will improve Outcome: Progressing

## 2023-11-10 NOTE — Plan of Care (Signed)
  Problem: Clinical Measurements: Goal: Ability to maintain clinical measurements within normal limits will improve Outcome: Progressing Goal: Will remain free from infection Outcome: Progressing Goal: Respiratory complications will improve Outcome: Progressing Goal: Cardiovascular complication will be avoided Outcome: Progressing   Problem: Activity: Goal: Risk for activity intolerance will decrease Outcome: Progressing   Problem: Elimination: Goal: Will not experience complications related to bowel motility Outcome: Progressing Goal: Will not experience complications related to urinary retention Outcome: Progressing   Problem: Safety: Goal: Ability to remain free from injury will improve Outcome: Progressing   Problem: Skin Integrity: Goal: Risk for impaired skin integrity will decrease Outcome: Progressing   Problem: Fluid Volume: Goal: Ability to maintain a balanced intake and output will improve Outcome: Progressing   Problem: Metabolic: Goal: Ability to maintain appropriate glucose levels will improve Outcome: Progressing   Problem: Nutritional: Goal: Maintenance of adequate nutrition will improve Outcome: Progressing   Problem: Skin Integrity: Goal: Risk for impaired skin integrity will decrease Outcome: Progressing
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
## 2023-11-10 NOTE — Progress Notes (Signed)
 PT Cancellation Note  Patient Details Name: Sheila Lang MRN: [id number] DOB: 08-14-1965   Cancelled Treatment:    Reason Eval/Treat Not Completed: Other (comment). Upon entry to room pt very upset. Adamantly wishing to speak with social work. Reports being upset about STR placement recommendation. Pt asking dino to leave and to get her Child psychotherapist. States she is refusing all meals, medication, treatments, etc. RN notified. Will re-attempt at a later time/date.   [name]. Fairly IV, PT, DPT Physical Therapist- [place]  [facility] 11/10/2023, 3:19 PM

## 2023-11-10 NOTE — TOC Progression Note (Signed)
 Transition of Care [facility]) - Progression Note    Patient Details  Name: Sheila Lang MRN: [id number] Date of Birth: Aug 10, 1965  Transition of Care [facility]) CM/SW Contact  Corean ONEIDA Haddock, RN Phone Number: 11/10/2023, 3:57 PM  Clinical Narrative:     [name] with [place] DSS on unit requesting clinical information showing that patient lacks capacity.  Medical notes provided, and she will reivew with her supervisor      Barriers to Discharge: Continued Medical Work up               Expected Discharge Plan and Services In-house Referral: Clinical Social Work     Living arrangements for the past 2 months: Single Family Home                                       Social Drivers of Health (SDOH) Interventions SDOH Screenings   Food Insecurity: No Food Insecurity (10/31/2023)  Housing: Low Risk  (10/31/2023)  Transportation Needs: No Transportation Needs (10/31/2023)  Utilities: Not At Risk (10/31/2023)  Alcohol Screen: Low Risk  (05/23/2019)  Depression (PHQ2-9): Low Risk  (08/20/2020)  Social Connections: Moderately Integrated (06/28/2023)  Tobacco Use: Low Risk  (10/31/2023)    Readmission Risk Interventions     No data to display

## 2023-11-11 DIAGNOSIS — E162 Hypoglycemia, unspecified: Secondary | ICD-10-CM | POA: Diagnosis not present

## 2023-11-11 LAB — CBC
HCT: 25.7 % — ABNORMAL LOW (ref 36.0–46.0)
Hemoglobin: 7.9 g/dL — ABNORMAL LOW (ref 12.0–15.0)
MCH: 27.5 pg (ref 26.0–34.0)
MCHC: 30.7 g/dL (ref 30.0–36.0)
MCV: 89.5 fL (ref 80.0–100.0)
Platelets: 154 K/uL (ref 150–400)
RBC: 2.87 MIL/uL — ABNORMAL LOW (ref 3.87–5.11)
RDW: 18.4 % — ABNORMAL HIGH (ref 11.5–15.5)
WBC: 7.2 K/uL (ref 4.0–10.5)
nRBC: 0 % (ref 0.0–0.2)

## 2023-11-11 LAB — GLUCOSE, CAPILLARY
Glucose-Capillary: 126 mg/dL — ABNORMAL HIGH (ref 70–99)
Glucose-Capillary: 87 mg/dL (ref 70–99)

## 2023-11-11 MED ORDER — MEGESTROL ACETATE 20 MG PO TABS
200.0000 mg | ORAL_TABLET | Freq: Every day | ORAL | Status: DC
  Administered 2023-11-11 – 2023-11-13 (×3): 200 mg via ORAL
  Filled 2023-11-11 (×3): qty 10
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
## 2023-11-11 NOTE — Plan of Care (Signed)
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
## 2023-11-12 DIAGNOSIS — E162 Hypoglycemia, unspecified: Secondary | ICD-10-CM | POA: Diagnosis not present

## 2023-11-12 LAB — GLUCOSE, CAPILLARY: Glucose-Capillary: 85 mg/dL (ref 70–99)

## 2023-11-12 NOTE — TOC Progression Note (Addendum)
 Transition of Care [facility]) - Progression Note    Patient Details  Name: Sheila Lang MRN: [id number] Date of Birth: 11/16/1965  Transition of Care [facility]) CM/SW Contact  Corean ONEIDA Haddock, RN Phone Number: 11/12/2023, 3:36 PM  Clinical Narrative:     Per Zane with [facility] auth has been approved and they can accept patient tomorrow  [name] with DSS notified by text Daughter Powell updated by phone  Update:  Per Merlynn at [employer] the cost to transport to [facility] will be $171.20.  They will require an email address for the invoice to be sent, and paid prior to transportation arriving. [name] with DSS notified     Barriers to Discharge: Continued Medical Work up               Expected Discharge Plan and Services In-house Referral: Clinical Social Work     Living arrangements for the past 2 months: Single Family Home                                       Social Drivers of Health (SDOH) Interventions SDOH Screenings   Food Insecurity: No Food Insecurity (10/31/2023)  Housing: Low Risk  (10/31/2023)  Transportation Needs: No Transportation Needs (10/31/2023)  Utilities: Not At Risk (10/31/2023)  Alcohol Screen: Low Risk  (05/23/2019)  Depression (PHQ2-9): Low Risk  (08/20/2020)  Social Connections: Moderately Integrated (06/28/2023)  Tobacco Use: Low Risk  (10/31/2023)    Readmission Risk Interventions     No data to display

## 2023-11-12 NOTE — Progress Notes (Signed)
 OT Cancellation Note  Patient Details Name: Sheila Lang MRN: [id number] DOB: 03-11-1966   Cancelled Treatment:    Reason Eval/Treat Not Completed: Patient declined, no reason specified.  Upon entry to patient's room, patient immediately stated, I don't want to do anything today without any further explanation provided.  Therapist asked if she would like therapist to try back later and patient replied No, I want to be left alone.    [name] OTR/L   [name] 11/12/2023, 1:32 PM
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
## 2023-11-12 NOTE — Ethics Note (Signed)
 Ethics Consult Note  Initial contact w/ medical team 11/12/23, which was on patient's hospital day 11   Source of Consult: Dr Leesa Current attending physician/service: Dezii, Alexandra, DO  Reason(s) for consult / relevant ethical question(s): 1) Appropriate decision-maker in patient without capacity / limited capacity  2) Appropriate to hold patient in hospital despite her wishes to leave   Information-gathering: Discussion with source of consult Chart review 11/12/23   Narrative:  Medical situation:  Description of case from person requesting consult: pt has multiple hospitalizations for FTT / underweight, now complicated by pressure ulceration. No psychiatric/organic reason found for low appetite and weight loss. No suicidal ideation.  Patient's decision-making capacity: limited - psychiatry reports pt lacks capacity. Based on description from Dr Leesa - pt has been clear and consistent with voicing broad wishes/goals (mainly wants to go home) but has very poor insight into her medical condition, demonstrates very poor ability to plan for future.  Patient's Personal/Social Situation:  If known: patient's relevant preferences, interests, culture, religion/spirituality, social support, financial concerns, QOL considerations, etc.  Advanced directive, DNR, MOST documents: none If known: other parties' preferences, interests.  Information about authorized surrogate, if any: pt's daughter and the daughter's father have been involved. Other family members have voiced they do NOT want to be included. Per Dr Leesa, daughter wants patient to be able to go home but DSS has concerns about the daughter's ability to make appropriate decisions. Daughter's father was previously upset that patient was discharged too soon.          Recommendations / Ethical Analysis:  RE PRINCIPLE OF AUTONOMY Competence: patient has NOT been declared legally incompetent and does NOT currently have a guardian.  Recommendation: though DSS is involved in this patient's case they are not the appropriate decision maker at this time unless they present a court order  Capacity: (Note that ethics committee members will not determine capacity as this is the responsibility of the treatment team.) Psychiatry team has noted lack of capacity. Per Dr Leesa, pt has voiced her wishes to go home but has poor insight/planning into her illness otherwise. Recommendation: patient's known values/goals should be respected whenever possible even in situations of LIMITED capacity, versus absolutely NO capacity as in an unconscious person, patients who reliably and consistently voice their opinions/values/preferences may be able to guide their treatment even if they lack ability to understand complex situations and make informed consent or refusal.  Surrogate decision-maker in patient with lack of capacity: in the absence of a designated legal guardian, the appropriate next of kin is the patient's daughter / other adult children if they exist. Recommendation: UNLESS medical team has a reason to believe a decision-maker (in this case patient's daughter) is obviously impaired or is not acting in best interest of the patient, they may act as Education officer, environmental. Recommendation: The daughter's father does not have legal claim to decision-making if the daughter is currently the appropriate decision-maker, though he may serve as someone who knows the patient well, and may be consulted if patient's daughter is not available / not willing to make decisions.   RE ETHICAL PRINCIPLES OF BENEFICENCE & NON-MALEFICENCE  Forcing or withholding treatment(s) - ethically, forcing treatment on a patient who lacks capacity requires a demonstrable benefit that a given treatment outweighs risk compared to alternatives / doing nothing. In cases of patients who are refusing treatment (in this case, patient wanting to leave the hospital), if the risk of such refusal of  treatment is not immediately life-threatening  to themselves or other persons / if they are not under IVC or other compelling force to stay, it is ethically appropriate to allow a patient to leave the hospital versus incur the risks involved in physically or chemically restraining that patient in order to force treatment. Additionally, in the interest of beneficence - it would be ethically appropriate to facilitate discharge and provide resources as able, rather than patient leave AMA with no other help/resources.            Thank you for this consult. Ethics will continue to follow this case.   Relevant underlying ethical principles were discussed directly with Dr Leesa. Other resources were discussed directly with them, if needed. Ethics committee strives to ensure that all necessary and appropriate steps are taken by the medical team such that all decisions made for this patient are ethically appropriate. Please note that Ethics committee members will NOT offer legal or medical counsel.  Dr Leesa has my personal cell phone number and has my permission to share this number at their discretion.  Secure message on Epic is also welcome but may not receive an immediate response.  Please reference AMION for on-call committee member if needed.    Laneta Blunt [facility] Ethics Committee

## 2023-11-13 DIAGNOSIS — M329 Systemic lupus erythematosus, unspecified: Secondary | ICD-10-CM

## 2023-11-13 DIAGNOSIS — M0609 Rheumatoid arthritis without rheumatoid factor, multiple sites: Secondary | ICD-10-CM | POA: Diagnosis not present

## 2023-11-13 DIAGNOSIS — R531 Weakness: Secondary | ICD-10-CM | POA: Diagnosis not present

## 2023-11-13 DIAGNOSIS — E43 Unspecified severe protein-calorie malnutrition: Secondary | ICD-10-CM | POA: Diagnosis not present

## 2023-11-13 DIAGNOSIS — F32A Depression, unspecified: Secondary | ICD-10-CM | POA: Diagnosis not present

## 2023-11-13 DIAGNOSIS — K219 Gastro-esophageal reflux disease without esophagitis: Secondary | ICD-10-CM | POA: Diagnosis not present

## 2023-11-13 DIAGNOSIS — Z7960 Long term (current) use of unspecified immunomodulators and immunosuppressants: Secondary | ICD-10-CM

## 2023-11-13 DIAGNOSIS — D649 Anemia, unspecified: Secondary | ICD-10-CM | POA: Diagnosis not present

## 2023-11-13 DIAGNOSIS — E162 Hypoglycemia, unspecified: Secondary | ICD-10-CM | POA: Diagnosis not present

## 2023-11-13 DIAGNOSIS — L89153 Pressure ulcer of sacral region, stage 3: Secondary | ICD-10-CM | POA: Diagnosis not present

## 2023-11-13 DIAGNOSIS — Z5919 Other inadequate housing: Secondary | ICD-10-CM | POA: Diagnosis not present

## 2023-11-13 DIAGNOSIS — R42 Dizziness and giddiness: Secondary | ICD-10-CM | POA: Diagnosis not present

## 2023-11-13 DIAGNOSIS — E039 Hypothyroidism, unspecified: Secondary | ICD-10-CM | POA: Diagnosis not present

## 2023-11-13 DIAGNOSIS — M0689 Other specified rheumatoid arthritis, multiple sites: Secondary | ICD-10-CM | POA: Diagnosis not present

## 2023-11-13 LAB — GLUCOSE, CAPILLARY: Glucose-Capillary: 93 mg/dL (ref 70–99)

## 2023-11-13 MED ORDER — HYDROXYZINE HCL 25 MG PO TABS
25.0000 mg | ORAL_TABLET | Freq: Three times a day (TID) | ORAL | Status: DC | PRN
  Administered 2023-11-13: 25 mg via ORAL
  Filled 2023-11-13: qty 1

## 2023-11-13 MED ORDER — MEGESTROL ACETATE 40 MG PO TABS: 200.0000 mg | ORAL_TABLET | Freq: Every day | ORAL | Status: AC

## 2023-11-13 MED ORDER — ENSURE PLUS HIGH PROTEIN PO LIQD: 237.0000 mL | Freq: Three times a day (TID) | ORAL | Status: AC

## 2023-11-13 MED ORDER — PANTOPRAZOLE SODIUM 40 MG PO TBEC: 40.0000 mg | DELAYED_RELEASE_TABLET | Freq: Every day | ORAL | Status: AC

## 2023-11-13 MED ORDER — LEVOTHYROXINE SODIUM 75 MCG PO TABS: 75.0000 ug | ORAL_TABLET | Freq: Every day | ORAL | Status: AC

## 2023-11-13 NOTE — TOC Transition Note (Addendum)
 Transition of Care [facility]) - Discharge Note   Patient Details  Name: Jaquita Bessire MRN: [id number] Date of Birth: 09-04-1965  [facility]) CM/SW Contact:  [place] L Temica Righetti, LCSW Phone Number: 11/13/2023, 1:00 PM   Clinical Narrative:     Transportation has been set up and scheduled with [facility] [employer]. DSS agreed to pay the invoice. [facility] is ready to receive patient. Family has been notified.   No further TOC needs observed.     Barriers to Discharge: Continued Medical Work up   Patient Goals and CMS Choice            Discharge Placement                       Discharge Plan and Services Additional resources added to the After Visit Summary for   In-house Referral: Clinical Social Work                                   Social Drivers of Health (SDOH) Interventions SDOH Screenings   Food Insecurity: No Food Insecurity (10/31/2023)  Housing: Low Risk  (10/31/2023)  Transportation Needs: No Transportation Needs (10/31/2023)  Utilities: Not At Risk (10/31/2023)  Alcohol Screen: Low Risk  (05/23/2019)  Depression (PHQ2-9): Low Risk  (08/20/2020)  Social Connections: Moderately Integrated (06/28/2023)  Tobacco Use: Low Risk  (10/31/2023)     Readmission Risk Interventions     No data to display

## 2023-11-13 NOTE — TOC Progression Note (Signed)
 Transition of Care [facility]) - Progression Note    Patient Details  Name: Libra Gatz MRN: [id number] Date of Birth: 1965-06-05  [facility] CM/SW Contact  [place] L Jerol Rufener, [place] Phone Number: 11/13/2023, 10:51 AM  Clinical Narrative:     CSW spoke with [facility], to arrange transportation to [facility]. CSW provided an email address for Merlynn to send an invoice to DSS. Merlynn advised that she would contact CSW to confirm receipt of payment or promise to pay.   Medical team made aware. CSW will await a response from [employer].     Barriers to Discharge: Continued Medical Work up               Expected Discharge Plan and Services In-house Referral: Clinical Social Work     Living arrangements for the past 2 months: Single Family Home                                       Social Drivers of Health (SDOH) Interventions SDOH Screenings   Food Insecurity: No Food Insecurity (10/31/2023)  Housing: Low Risk  (10/31/2023)  Transportation Needs: No Transportation Needs (10/31/2023)  Utilities: Not At Risk (10/31/2023)  Alcohol Screen: Low Risk  (05/23/2019)  Depression (PHQ2-9): Low Risk  (08/20/2020)  Social Connections: Moderately Integrated (06/28/2023)  Tobacco Use: Low Risk  (10/31/2023)    Readmission Risk Interventions     No data to display
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
## 2023-11-16 DIAGNOSIS — R5381 Other malaise: Secondary | ICD-10-CM | POA: Diagnosis not present

## 2023-11-16 DIAGNOSIS — M329 Systemic lupus erythematosus, unspecified: Secondary | ICD-10-CM | POA: Diagnosis not present

## 2023-11-16 DIAGNOSIS — K5900 Constipation, unspecified: Secondary | ICD-10-CM | POA: Diagnosis not present

## 2023-11-16 DIAGNOSIS — K219 Gastro-esophageal reflux disease without esophagitis: Secondary | ICD-10-CM | POA: Diagnosis not present

## 2023-11-16 DIAGNOSIS — E039 Hypothyroidism, unspecified: Secondary | ICD-10-CM | POA: Diagnosis not present

## 2023-11-18 DIAGNOSIS — E43 Unspecified severe protein-calorie malnutrition: Secondary | ICD-10-CM | POA: Diagnosis not present

## 2023-11-18 DIAGNOSIS — Z556 Problems related to health literacy: Secondary | ICD-10-CM | POA: Diagnosis not present

## 2023-11-18 DIAGNOSIS — M329 Systemic lupus erythematosus, unspecified: Secondary | ICD-10-CM | POA: Diagnosis not present

## 2023-11-18 DIAGNOSIS — Z789 Other specified health status: Secondary | ICD-10-CM | POA: Diagnosis not present

## 2023-11-18 DIAGNOSIS — F99 Mental disorder, not otherwise specified: Secondary | ICD-10-CM | POA: Diagnosis not present

## 2023-11-18 DIAGNOSIS — E039 Hypothyroidism, unspecified: Secondary | ICD-10-CM | POA: Diagnosis not present

## 2023-11-18 DIAGNOSIS — L89153 Pressure ulcer of sacral region, stage 3: Secondary | ICD-10-CM | POA: Diagnosis not present

## 2023-11-18 DIAGNOSIS — K5900 Constipation, unspecified: Secondary | ICD-10-CM | POA: Diagnosis not present

## 2023-11-19 DIAGNOSIS — Z789 Other specified health status: Secondary | ICD-10-CM | POA: Diagnosis not present

## 2023-11-19 DIAGNOSIS — E43 Unspecified severe protein-calorie malnutrition: Secondary | ICD-10-CM | POA: Diagnosis not present

## 2023-11-19 DIAGNOSIS — R5381 Other malaise: Secondary | ICD-10-CM | POA: Diagnosis not present

## 2023-11-19 DIAGNOSIS — L89153 Pressure ulcer of sacral region, stage 3: Secondary | ICD-10-CM | POA: Diagnosis not present

## 2023-11-20 DIAGNOSIS — R42 Dizziness and giddiness: Secondary | ICD-10-CM | POA: Diagnosis not present

## 2023-11-20 DIAGNOSIS — M0689 Other specified rheumatoid arthritis, multiple sites: Secondary | ICD-10-CM | POA: Diagnosis not present

## 2023-11-20 DIAGNOSIS — Z5919 Other inadequate housing: Secondary | ICD-10-CM | POA: Diagnosis not present

## 2023-11-20 DIAGNOSIS — L89153 Pressure ulcer of sacral region, stage 3: Secondary | ICD-10-CM | POA: Diagnosis not present

## 2023-11-20 DIAGNOSIS — K5900 Constipation, unspecified: Secondary | ICD-10-CM | POA: Diagnosis not present

## 2023-11-20 DIAGNOSIS — E43 Unspecified severe protein-calorie malnutrition: Secondary | ICD-10-CM | POA: Diagnosis not present

## 2023-11-20 DIAGNOSIS — M329 Systemic lupus erythematosus, unspecified: Secondary | ICD-10-CM | POA: Diagnosis not present

## 2023-11-20 DIAGNOSIS — E039 Hypothyroidism, unspecified: Secondary | ICD-10-CM | POA: Diagnosis not present

## 2023-11-20 DIAGNOSIS — K219 Gastro-esophageal reflux disease without esophagitis: Secondary | ICD-10-CM | POA: Diagnosis not present

## 2023-11-20 DIAGNOSIS — R5381 Other malaise: Secondary | ICD-10-CM | POA: Diagnosis not present

## 2023-11-20 DIAGNOSIS — F32A Depression, unspecified: Secondary | ICD-10-CM | POA: Diagnosis not present

## 2023-11-23 DIAGNOSIS — R5381 Other malaise: Secondary | ICD-10-CM | POA: Diagnosis not present

## 2023-11-23 DIAGNOSIS — K5900 Constipation, unspecified: Secondary | ICD-10-CM | POA: Diagnosis not present

## 2023-11-23 DIAGNOSIS — M329 Systemic lupus erythematosus, unspecified: Secondary | ICD-10-CM | POA: Diagnosis not present

## 2023-11-23 DIAGNOSIS — E039 Hypothyroidism, unspecified: Secondary | ICD-10-CM | POA: Diagnosis not present

## 2023-11-23 DIAGNOSIS — K219 Gastro-esophageal reflux disease without esophagitis: Secondary | ICD-10-CM | POA: Diagnosis not present

## 2023-11-24 DIAGNOSIS — K219 Gastro-esophageal reflux disease without esophagitis: Secondary | ICD-10-CM | POA: Diagnosis not present

## 2023-11-24 DIAGNOSIS — E43 Unspecified severe protein-calorie malnutrition: Secondary | ICD-10-CM | POA: Diagnosis not present

## 2023-11-24 DIAGNOSIS — R5381 Other malaise: Secondary | ICD-10-CM | POA: Diagnosis not present

## 2023-11-24 DIAGNOSIS — M329 Systemic lupus erythematosus, unspecified: Secondary | ICD-10-CM | POA: Diagnosis not present

## 2023-11-24 DIAGNOSIS — E039 Hypothyroidism, unspecified: Secondary | ICD-10-CM | POA: Diagnosis not present

## 2023-11-27 DIAGNOSIS — E039 Hypothyroidism, unspecified: Secondary | ICD-10-CM | POA: Diagnosis not present

## 2023-11-27 DIAGNOSIS — K5900 Constipation, unspecified: Secondary | ICD-10-CM | POA: Diagnosis not present

## 2023-11-27 DIAGNOSIS — R5381 Other malaise: Secondary | ICD-10-CM | POA: Diagnosis not present

## 2023-11-27 DIAGNOSIS — K219 Gastro-esophageal reflux disease without esophagitis: Secondary | ICD-10-CM | POA: Diagnosis not present

## 2023-11-27 DIAGNOSIS — M329 Systemic lupus erythematosus, unspecified: Secondary | ICD-10-CM | POA: Diagnosis not present

## 2023-12-01 DIAGNOSIS — E039 Hypothyroidism, unspecified: Secondary | ICD-10-CM | POA: Diagnosis not present

## 2023-12-01 DIAGNOSIS — K5900 Constipation, unspecified: Secondary | ICD-10-CM | POA: Diagnosis not present

## 2023-12-01 DIAGNOSIS — M329 Systemic lupus erythematosus, unspecified: Secondary | ICD-10-CM | POA: Diagnosis not present

## 2023-12-01 DIAGNOSIS — K219 Gastro-esophageal reflux disease without esophagitis: Secondary | ICD-10-CM | POA: Diagnosis not present

## 2023-12-01 DIAGNOSIS — R5381 Other malaise: Secondary | ICD-10-CM | POA: Diagnosis not present

## 2023-12-03 DIAGNOSIS — R5381 Other malaise: Secondary | ICD-10-CM | POA: Diagnosis not present

## 2023-12-03 DIAGNOSIS — K5900 Constipation, unspecified: Secondary | ICD-10-CM | POA: Diagnosis not present

## 2023-12-03 DIAGNOSIS — E039 Hypothyroidism, unspecified: Secondary | ICD-10-CM | POA: Diagnosis not present

## 2023-12-03 DIAGNOSIS — K219 Gastro-esophageal reflux disease without esophagitis: Secondary | ICD-10-CM | POA: Diagnosis not present

## 2023-12-03 DIAGNOSIS — M329 Systemic lupus erythematosus, unspecified: Secondary | ICD-10-CM | POA: Diagnosis not present

## 2024-01-06 DIAGNOSIS — E039 Hypothyroidism, unspecified: Secondary | ICD-10-CM | POA: Diagnosis not present

## 2024-01-06 DIAGNOSIS — Z79899 Other long term (current) drug therapy: Secondary | ICD-10-CM | POA: Diagnosis not present

## 2024-01-06 DIAGNOSIS — M0609 Rheumatoid arthritis without rheumatoid factor, multiple sites: Secondary | ICD-10-CM | POA: Diagnosis not present

## 2024-01-06 DIAGNOSIS — M329 Systemic lupus erythematosus, unspecified: Secondary | ICD-10-CM | POA: Diagnosis not present

## 2024-01-08 DIAGNOSIS — Z79899 Other long term (current) drug therapy: Secondary | ICD-10-CM | POA: Diagnosis not present

## 2024-01-08 DIAGNOSIS — E039 Hypothyroidism, unspecified: Secondary | ICD-10-CM | POA: Diagnosis not present

## 2024-01-08 DIAGNOSIS — M329 Systemic lupus erythematosus, unspecified: Secondary | ICD-10-CM | POA: Diagnosis not present

## 2024-01-08 DIAGNOSIS — M0609 Rheumatoid arthritis without rheumatoid factor, multiple sites: Secondary | ICD-10-CM | POA: Diagnosis not present

## 2024-04-01 DIAGNOSIS — I7300 Raynaud's syndrome without gangrene: Secondary | ICD-10-CM | POA: Diagnosis not present

## 2024-04-01 DIAGNOSIS — M329 Systemic lupus erythematosus, unspecified: Secondary | ICD-10-CM | POA: Diagnosis not present

## 2024-04-01 DIAGNOSIS — E162 Hypoglycemia, unspecified: Secondary | ICD-10-CM | POA: Diagnosis not present

## 2024-04-01 DIAGNOSIS — F32A Depression, unspecified: Secondary | ICD-10-CM | POA: Diagnosis not present

## 2024-04-01 DIAGNOSIS — Z79899 Other long term (current) drug therapy: Secondary | ICD-10-CM | POA: Diagnosis not present

## 2024-05-19 ENCOUNTER — Ambulatory Visit: Admitting: Podiatry
